# Patient Record
Sex: Female | Born: 1940
Health system: Southern US, Community
[De-identification: ages and names within clinical notes are randomized; demographics above are authoritative.]

## PROBLEM LIST (undated history)

## (undated) DIAGNOSIS — M1990 Unspecified osteoarthritis, unspecified site: Secondary | ICD-10-CM

## (undated) DIAGNOSIS — K08109 Complete loss of teeth, unspecified cause, unspecified class: Secondary | ICD-10-CM

## (undated) DIAGNOSIS — D649 Anemia, unspecified: Secondary | ICD-10-CM

## (undated) DIAGNOSIS — F419 Anxiety disorder, unspecified: Secondary | ICD-10-CM

## (undated) DIAGNOSIS — Z972 Presence of dental prosthetic device (complete) (partial): Secondary | ICD-10-CM

## (undated) DIAGNOSIS — Z923 Personal history of irradiation: Secondary | ICD-10-CM

## (undated) DIAGNOSIS — E559 Vitamin D deficiency, unspecified: Secondary | ICD-10-CM

## (undated) DIAGNOSIS — K219 Gastro-esophageal reflux disease without esophagitis: Secondary | ICD-10-CM

## (undated) DIAGNOSIS — C50912 Malignant neoplasm of unspecified site of left female breast: Secondary | ICD-10-CM

## (undated) DIAGNOSIS — Z973 Presence of spectacles and contact lenses: Secondary | ICD-10-CM

## (undated) DIAGNOSIS — Z86010 Personal history of colonic polyps: Secondary | ICD-10-CM

## (undated) DIAGNOSIS — I10 Essential (primary) hypertension: Secondary | ICD-10-CM

## (undated) DIAGNOSIS — E213 Hyperparathyroidism, unspecified: Secondary | ICD-10-CM

## (undated) HISTORY — DX: Anxiety disorder, unspecified: F41.9

## (undated) HISTORY — DX: Vitamin D deficiency, unspecified: E55.9

## (undated) HISTORY — PX: TOTAL KNEE ARTHROPLASTY: SHX125

## (undated) HISTORY — DX: Essential (primary) hypertension: I10

## (undated) HISTORY — DX: Personal history of colonic polyps: Z86.010

---

## 2002-01-20 ENCOUNTER — Encounter: Payer: Self-pay | Admitting: Family Medicine

## 2002-01-20 ENCOUNTER — Ambulatory Visit (HOSPITAL_COMMUNITY): Admission: RE | Admit: 2002-01-20 | Discharge: 2002-01-20 | Payer: Self-pay | Admitting: Family Medicine

## 2007-10-27 DIAGNOSIS — Z860100 Personal history of colon polyps, unspecified: Secondary | ICD-10-CM

## 2007-10-27 HISTORY — DX: Personal history of colonic polyps: Z86.010

## 2007-10-27 HISTORY — DX: Personal history of colon polyps, unspecified: Z86.0100

## 2009-01-31 ENCOUNTER — Ambulatory Visit (HOSPITAL_COMMUNITY): Payer: Self-pay | Admitting: Psychiatry

## 2009-02-14 ENCOUNTER — Ambulatory Visit (HOSPITAL_COMMUNITY): Payer: Self-pay | Admitting: Psychiatry

## 2009-02-26 ENCOUNTER — Ambulatory Visit (HOSPITAL_COMMUNITY): Payer: Self-pay | Admitting: Psychiatry

## 2009-04-01 ENCOUNTER — Ambulatory Visit (HOSPITAL_COMMUNITY): Payer: Self-pay | Admitting: Psychiatry

## 2009-04-22 ENCOUNTER — Ambulatory Visit (HOSPITAL_COMMUNITY): Payer: Self-pay | Admitting: Psychiatry

## 2009-05-07 ENCOUNTER — Inpatient Hospital Stay (HOSPITAL_COMMUNITY): Admission: RE | Admit: 2009-05-07 | Discharge: 2009-05-10 | Payer: Self-pay | Admitting: Specialist

## 2009-09-06 ENCOUNTER — Ambulatory Visit (HOSPITAL_COMMUNITY): Payer: Self-pay | Admitting: Psychiatry

## 2009-10-26 HISTORY — PX: BREAST LUMPECTOMY: SHX2

## 2010-02-07 ENCOUNTER — Inpatient Hospital Stay (HOSPITAL_COMMUNITY): Admission: RE | Admit: 2010-02-07 | Discharge: 2010-02-10 | Payer: Self-pay | Admitting: Specialist

## 2010-04-10 ENCOUNTER — Ambulatory Visit: Admission: RE | Admit: 2010-04-10 | Discharge: 2010-07-09 | Payer: Self-pay | Admitting: Radiation Oncology

## 2011-01-13 LAB — BASIC METABOLIC PANEL
BUN: 9 mg/dL (ref 6–23)
BUN: 9 mg/dL (ref 6–23)
CO2: 28 mEq/L (ref 19–32)
CO2: 28 mEq/L (ref 19–32)
Calcium: 9.3 mg/dL (ref 8.4–10.5)
Calcium: 9.4 mg/dL (ref 8.4–10.5)
Chloride: 103 mEq/L (ref 96–112)
Chloride: 104 mEq/L (ref 96–112)
Creatinine, Ser: 0.93 mg/dL (ref 0.4–1.2)
Creatinine, Ser: 0.94 mg/dL (ref 0.4–1.2)
GFR calc Af Amer: >60 mL/min (ref >60)
GFR calc Af Amer: >60 mL/min (ref >60)
GFR calc non Af Amer: 60 mL/min — ABNORMAL LOW (ref >60)
GFR calc non Af Amer: >60 mL/min (ref >60)
Glucose, Bld: 124 mg/dL — ABNORMAL HIGH (ref 70–99)
Glucose, Bld: 133 mg/dL — ABNORMAL HIGH (ref 70–99)
Potassium: 3.6 mEq/L (ref 3.5–5.1)
Potassium: 3.7 mEq/L (ref 3.5–5.1)
Sodium: 136 mEq/L (ref 135–145)
Sodium: 136 mEq/L (ref 135–145)

## 2011-01-13 LAB — CBC
HCT: 28.2 % — ABNORMAL LOW (ref 36.0–46.0)
HCT: 31.3 % — ABNORMAL LOW (ref 36.0–46.0)
HCT: 31.8 % — ABNORMAL LOW (ref 36.0–46.0)
Hemoglobin: 10.5 g/dL — ABNORMAL LOW (ref 12.0–15.0)
Hemoglobin: 10.7 g/dL — ABNORMAL LOW (ref 12.0–15.0)
Hemoglobin: 9.4 g/dL — ABNORMAL LOW (ref 12.0–15.0)
MCHC: 33.2 g/dL (ref 30.0–36.0)
MCHC: 33.4 g/dL (ref 30.0–36.0)
MCHC: 33.5 g/dL (ref 30.0–36.0)
MCV: 92.8 fL (ref 78.0–100.0)
MCV: 93.1 fL (ref 78.0–100.0)
MCV: 93.4 fL (ref 78.0–100.0)
Platelets: 214 10*3/uL (ref 150–400)
Platelets: 225 10*3/uL (ref 150–400)
Platelets: 261 10*3/uL (ref 150–400)
RBC: 3.02 MIL/uL — ABNORMAL LOW (ref 3.87–5.11)
RBC: 3.36 MIL/uL — ABNORMAL LOW (ref 3.87–5.11)
RBC: 3.43 MIL/uL — ABNORMAL LOW (ref 3.87–5.11)
RDW: 12.6 % (ref 11.5–15.5)
RDW: 12.8 % (ref 11.5–15.5)
RDW: 12.8 % (ref 11.5–15.5)
WBC: 10.6 10*3/uL — ABNORMAL HIGH (ref 4.0–10.5)
WBC: 11.2 10*3/uL — ABNORMAL HIGH (ref 4.0–10.5)
WBC: 8.9 10*3/uL (ref 4.0–10.5)

## 2011-01-14 LAB — COMPREHENSIVE METABOLIC PANEL
BUN: 23 mg/dL (ref 6–23)
CO2: 30 mEq/L (ref 19–32)
Calcium: 10.2 mg/dL (ref 8.4–10.5)
Chloride: 105 mEq/L (ref 96–112)
Creatinine, Ser: 1.04 mg/dL (ref 0.4–1.2)
GFR calc Af Amer: >60 mL/min (ref >60)
GFR calc non Af Amer: 53 mL/min — ABNORMAL LOW (ref >60)
Total Bilirubin: 0.5 mg/dL (ref 0.3–1.2)

## 2011-01-14 LAB — CBC
HCT: 35.5 % — ABNORMAL LOW (ref 36.0–46.0)
MCHC: 33.2 g/dL (ref 30.0–36.0)
MCV: 93.1 fL (ref 78.0–100.0)
RBC: 3.82 MIL/uL — ABNORMAL LOW (ref 3.87–5.11)
WBC: 6.2 10*3/uL (ref 4.0–10.5)

## 2011-01-14 LAB — URINE MICROSCOPIC-ADD ON

## 2011-01-14 LAB — CROSSMATCH
ABO/RH(D): O POS
Antibody Screen: NEGATIVE

## 2011-01-14 LAB — URINALYSIS, ROUTINE W REFLEX MICROSCOPIC
Bilirubin Urine: NEGATIVE
Hgb urine dipstick: NEGATIVE
Ketones, ur: NEGATIVE mg/dL
Specific Gravity, Urine: 1.024 (ref 1.005–1.030)
Urobilinogen, UA: 0.2 mg/dL (ref 0.0–1.0)

## 2011-01-14 LAB — DIFFERENTIAL
Basophils Absolute: 0 10*3/uL (ref 0.0–0.1)
Eosinophils Relative: 4 % (ref 0–5)
Lymphocytes Relative: 19 % (ref 12–46)
Lymphs Abs: 1.2 10*3/uL (ref 0.7–4.0)
Neutro Abs: 4.1 10*3/uL (ref 1.7–7.7)
Neutrophils Relative %: 66 % (ref 43–77)

## 2011-01-14 LAB — PROTIME-INR
INR: 0.98 (ref 0.00–1.49)
Prothrombin Time: 12.9 seconds (ref 11.6–15.2)

## 2011-01-14 LAB — APTT: aPTT: 22 seconds — ABNORMAL LOW (ref 24–37)

## 2011-02-01 LAB — CBC
HCT: 26.8 % — ABNORMAL LOW (ref 36.0–46.0)
HCT: 32.1 % — ABNORMAL LOW (ref 36.0–46.0)
HCT: 35.8 % — ABNORMAL LOW (ref 36.0–46.0)
Hemoglobin: 9.1 g/dL — ABNORMAL LOW (ref 12.0–15.0)
Hemoglobin: 11.8 g/dL — ABNORMAL LOW (ref 12.0–15.0)
MCHC: 33 g/dL (ref 30.0–36.0)
MCHC: 33.5 g/dL (ref 30.0–36.0)
MCV: 93.8 fL (ref 78.0–100.0)
MCV: 92.3 fL (ref 78.0–100.0)
Platelets: 177 10*3/uL (ref 150–400)
Platelets: 244 10*3/uL (ref 150–400)
RDW: 13.6 % (ref 11.5–15.5)
RDW: 13.8 % (ref 11.5–15.5)
RDW: 13.9 % (ref 11.5–15.5)
WBC: 11.2 10*3/uL — ABNORMAL HIGH (ref 4.0–10.5)
WBC: 11.9 10*3/uL — ABNORMAL HIGH (ref 4.0–10.5)

## 2011-02-01 LAB — CROSSMATCH: Antibody Screen: NEGATIVE

## 2011-02-01 LAB — BASIC METABOLIC PANEL
BUN: 10 mg/dL (ref 6–23)
BUN: 13 mg/dL (ref 6–23)
CO2: 29 mEq/L (ref 19–32)
Chloride: 107 mEq/L (ref 96–112)
Chloride: 107 mEq/L (ref 96–112)
Creatinine, Ser: 0.93 mg/dL (ref 0.4–1.2)
GFR calc non Af Amer: >60 mL/min (ref >60)
GFR calc non Af Amer: >60 mL/min (ref >60)
Glucose, Bld: 120 mg/dL — ABNORMAL HIGH (ref 70–99)
Glucose, Bld: 141 mg/dL — ABNORMAL HIGH (ref 70–99)
Potassium: 4.3 mEq/L (ref 3.5–5.1)
Potassium: 4.2 mEq/L (ref 3.5–5.1)
Sodium: 139 mEq/L (ref 135–145)

## 2011-02-01 LAB — COMPREHENSIVE METABOLIC PANEL
BUN: 17 mg/dL (ref 6–23)
Calcium: 10.2 mg/dL (ref 8.4–10.5)
Creatinine, Ser: 0.9 mg/dL (ref 0.4–1.2)
Glucose, Bld: 93 mg/dL (ref 70–99)
Total Protein: 7.2 g/dL (ref 6.0–8.3)

## 2011-02-01 LAB — URINALYSIS, ROUTINE W REFLEX MICROSCOPIC
Bilirubin Urine: NEGATIVE
Nitrite: NEGATIVE
Specific Gravity, Urine: 1.019 (ref 1.005–1.030)
Urobilinogen, UA: 0.2 mg/dL (ref 0.0–1.0)

## 2011-02-01 LAB — DIFFERENTIAL
Basophils Relative: 1 % (ref 0–1)
Lymphocytes Relative: 23 % (ref 12–46)
Lymphs Abs: 1.7 10*3/uL (ref 0.7–4.0)
Monocytes Relative: 9 % (ref 3–12)
Neutro Abs: 4.6 10*3/uL (ref 1.7–7.7)
Neutrophils Relative %: 63 % (ref 43–77)

## 2011-02-01 LAB — PROTIME-INR: INR: 1 (ref 0.00–1.49)

## 2011-02-01 LAB — APTT: aPTT: 23 seconds — ABNORMAL LOW (ref 24–37)

## 2011-03-10 NOTE — Op Note (Signed)
NAME:  [name] [name], [name] [name]             ACCOUNT NO.:  [id number]   MEDICAL RECORD NO.:  [id number]          PATIENT TYPE:  INP   LOCATION:  0006                         FACILITY:  [facility]   PHYSICIAN:  Erasmo Leventhal, M.D.DATE OF BIRTH:  Sep 24, 1941   DATE OF PROCEDURE:  05/07/2009  DATE OF DISCHARGE:                               OPERATIVE REPORT   PREOPERATIVE DIAGNOSIS:  Left knee end-stage osteoarthritis.   POSTOPERATIVE DIAGNOSIS:  Left knee end-stage osteoarthritis.   PROCEDURE:  Left total knee arthroplasty.   SURGEON:  Erasmo Leventhal, M.D.   ASSISTANT:  Oneida Alar, PA-C.   ANESTHESIA:  Regional with general.   BLOOD LOSS:  Less than 100 mL.   DRAINS:  One medium Hemovac.   COMPLICATIONS:  None.   TOURNIQUET TIME:  One hour and 55 minutes at 350 mmHg.   OPERATIVE IMPLANTS:  DePuy [employer].  Size 2.5 femur,  size 3.0 tibia, 15 mm posterior stabilized rotating platform tibial  insert and a 35 mm all polyethylene patella, all cemented.   OPERATIVE DETAILS:  The patient was counseled in the holding identified.  The correct side was identified, IV started, regional anesthetic was  administered, IV antibiotics were given on the way to the operating  room.  There, she was placed under general anesthesia.  A Foley catheter  placed in a sterile technique by the OR circulating nurse.  All  extremities were well padded and bumped.  Left knee 75 degree flexion  contracture.  She could flex to 100 degrees, limited by the large  posterior size of her thigh.  She was elevated, prepped with DuraPrep  and draped in a sterile fashion.  Exsanguinated with an Esmarch and  tourniquet inflated to 350 mmHg.  A straight midline incision was made  in the skin and subcutaneous tissue.  Medial and lateral soft tissue  flaps were developed and a medial parapatellar arthrotomy was performed.  The patella was retracted out of the way.  Severe end-stage arthritis  with  large bone spurs on both femur and tibia.  Osteophytes were removed  from the intercondylar notch.  The cruciate ligaments were resected.  A  starter hole made in the distal femur, canal was irrigated, the effluent  was clear.  Intramedullary rod was gently placed.  We chose a 5 valgus  cut and took a 10 mm cut off the distal femur.  The distal femur was  found be a size 2.5.  Rotation covers were set.  Somehow, there was  somewhat of an atypical knee with a hypoplastic lateral femoral condyle.  He had medial wear on the tibia.  Rotation was set based upon  epicondylar axis and Whitesides line.  Rotation covers were set for 2.5  femoral component, cuts were made.  Collateral ligaments were protected.  Posterior neurovascular structures were thawed off and protected  throughout the entire case.  The medial and lateral menisci were removed  under direct visualization.  Geniculate vessels were coagulated.  Large  osteophytes removed from the proximal tibia.  Extramedullary alignment  was utilized for the tibia with a 2  degree posterior slope because we  wanted to use the MBT revision tray due to her body habitus.  It was set  for a 10 mm cut off the least deficient side which was lateral side or 2  degree posterior slope.  This was done.  Posteromedial and posterior  femoral osteophytes were removed under direct visualization.  At this  time with flexion/extension blocks for a 10 insert were well-balanced.   The tibia was exposed.  Rotation cover was set for a size #3.  Reaming  was performed and then rotation cover set and then a keel was performed.  Femoral box cut was now performed.  At this point in time with a size  2.5 femur, 3.0 tibia, 10 mm insert, posterior stabilized rotating  platform, we had a well-balanced knee with a nice 5 degree valgus  alignment, full extension, stable to varus and valgus stress at 0, 30,  60, and 90 degrees of flexion.  The patella was found to be a size  #35.  Excess bone was removed and the patella button was placed for a 35 mm.  We now had anatomic patellofemoral tracking.  All trials were removed.  The knee was irrigated with pulsatile lavage.  Also, this was  supplemented by antibiotic solution.   Utilizing modern cement technique, all components were cemented into  place, a size 3 MBT revision tibia, size 2.5 Sigma femur, 35 patella.  We allowed the cement to cure with the 10 insert.  After the cemented  had cured, excess cement was removed.  We trialed with a 12.5 tibial  insert.  We had excellent full extension, flexion to 110 __________.  Rotation coverage was excellent.  The knee was exposed, trial was  removed, excess cement was removed, pulsatile lavaged again and the  final 12.5 posterior stabilized rotating platform tibial insert was  implanted.  We now checked the knee.  We had well-balanced  flexion/extension.  The patella tracked anatomically.   Medium Hemovac drains were placed.  A sequential closure in layers was  done.  The knee was closed in flexion with Vicryl sutures, subcu Vicryl,  skin with a subcuticular Monocryl suture.  Steri-Strips were applied.  A  sterile dressing applied.  The tourniquet was deflated.  Normal  circulation in the foot and ankle at the end of case.  The patient  tolerated the procedure well with no complication or problems.  She was  awakened and taken from the operating room to the PACU in stable  condition.   To help with surgical technique and decision making, Mr. Leana Gamer  assistance was needed throughout this entire difficult case due to her  large body habitus and her osseous deformity.           ______________________________  Erasmo Leventhal, M.D.     RAC/MEDQ  D:  05/07/2009  T:  05/07/2009  Job:  [id number]   cc:   [facility]

## 2011-03-10 NOTE — Discharge Summary (Signed)
NAME:  [name], [name]             ACCOUNT NO.:  [id number]   MEDICAL RECORD NO.:  [id number]          PATIENT TYPE:  INP   LOCATION:  1618                         FACILITY:  [facility]   PHYSICIAN:  Erasmo Leventhal, M.D.DATE OF BIRTH:  16-May-1941   DATE OF ADMISSION:  05/07/2009  DATE OF DISCHARGE:  05/10/2009                               DISCHARGE SUMMARY   ADMISSION DIAGNOSES:  1. End-stage osteoarthritis bilateral knees, left more symptomatic      than right.  2. Hypertension.  3. History of cataracts.  4. History of dentures.  5. Morbid obesity.   DISCHARGE DIAGNOSES:  1. Left total knee arthroplasty.  2. Acute postoperative blood loss anemia, asymptomatic.  We will allow      to self correct with p.o. supplements.  3. Postoperative hypokalemia, resolved with p.o. supplements,      asymptomatic.  4. Osteoarthritis right knee.  5. History of cataracts.  6. History of dentures.   HISTORY OF PRESENT ILLNESS:  The patient is a 70 year old female with  bilateral knee pain that has been bothering her for several years.  She  has failed conservative treatment.  The patient has popping, catching  and locking.  X-rays show she is bone-on-bone.  The patient elected to  proceed with a left total knee arthroplasty as the left knee was worse  than right.   ALLERGIES:  SULFA.   MEDICATIONS ON ADMISSION:  Celebrex, benazepril/hydrochlorothiazide,  aspirin, Centrum Silver, vitamin D.   SURGICAL PROCEDURES:  On May 07, 2009, the patient was taken to the OR  by Dr. Valma Cava, assisted by Oneida Alar, PA-C.  Under general and  regional anesthesia, the patient underwent a left total knee  arthroplasty with a DePuy rotating platform system.  There were no  complications.  The patient tolerated the procedure well.  There was  minimal blood loss.  The patient was transferred to the recovery room  and then to the orthopedic floor in good condition to follow total knee  protocol.  The  patient had the following components implanted:  A 2.5  left femoral component, a size 3 MBT tray, a size 35 three peg patella,  a size 2.5, 12.5-mm thickness polyethylene bearing.  All components were  implanted with polymethylmethacrylate.   CONSULTS:  The following routine consults were requested; physical  therapy, case management and pharmacy.   HOSPITAL COURSE:  On May 07, 2009, the patient was admitted to [facility] under the care of Dr. Valma Cava.  The patient was  taken to the OR where a left total knee arthroplasty was performed  without any complications.  The patient was transferred to the recovery  room and then to the orthopedic floor in good condition to follow a  total knee protocol on IV antibiotics, pain medicines and Lovenox for  DVT prophylaxis.  The patient did well throughout her hospitalization.  She did develop some low grade temps, mostly early in the mornings, but  these resolved and no etiology was specific.  The patient's vital signs  remained stable.  The patient did develop some acute postoperative blood  loss anemia.  Her hemoglobin was about 11, coming into the hospital and  she dropped down to 9.1.  On the date of discharge, the patient was  asymptomatic.  She tolerated it well without any lightheadedness or  dizziness, so she was allowed to self correct with p.o. supplements.  The patient also developed some postoperative hypokalemia.  Her  potassium dropped to 3.3.  She was given p.o. supplements and it did  improve to 4.3 on discharge.  The patient remained asymptomatic.  The  patient did very well with her CPM and ambulation with physical therapy.  She used minimal amounts of pain medicines.  The patient was arranged  for discharge home as she is medically and orthopedically stable and  ready for discharge.   LABORATORY DATA:  CBC on admission found WBC 7.3, hemoglobin 11.8,  hematocrit 35.8, platelets 251.  On discharge, WBCs were  11.3,  hemoglobin 9.1, hematocrit 26.8, platelets 174.  Routine chemistries on  admission were within normal limits.  Her potassium did drop to 3.3, but  with p.o. supplements she did improve to 4.3 on discharge.   DISCHARGE INSTRUCTIONS:   DIET:  No restrictions.   ACTIVITY:  The patient is to increase her activity as tolerated with the  use of a walker.   WOUND CARE:  She is keep the wound clean and dry.  She should change the  dressing on a daily basis.  She may shower if she covers the wound with  some saran wrap to seal it.   FOLLOW UP:  1. The patient needs a follow-up appointment in 2 weeks with Dr.      Thomasena Edis.  The patient is to call [phone] for that follow-up      appointment.  2. Home health care through [place].  3. CPM for home use through [employer]   MEDICATIONS:  1. Percocet 5 mg 1-2 every 4-6 hours for pain if needed.  2. Robaxin 500 mg 1 tablet every 6 hours for muscle spasms if needed.  3. Lovenox injection 1 injection every 12 hours until gone.  4. Colace 100 mg 1 tablet twice a day while on narcotics, may get over-      the-counter.  5. MiraLax 17 grams once a day for constipation if needed, may get      over-the-counter.  6. Iron 1 tablet twice a day for 30 days.   CPM INSTRUCTIONS:  She is to be 0-50 degrees for 6 hours a days and she  is to increase by 10 degrees a day as comfort allows.   The patient's condition upon discharge to home is listed as improved and  good.      Jamelle Rushing, P.A.    ______________________________  Erasmo Leventhal, M.D.    RWK/MEDQ  D:  05/10/2009  T:  05/10/2009  Job:  [id number]   cc:   Erasmo Leventhal, M.D.  Fax: [phone]   Clelia Croft, MD

## 2011-03-10 NOTE — H&P (Signed)
NAME:  [name] [name]             ACCOUNT NO.:  [id number]   MEDICAL RECORD NO.:  [id number]          PATIENT TYPE:  INP   LOCATION:                               FACILITY:  [facility]   PHYSICIAN:  Erasmo Leventhal, M.D.DATE OF BIRTH:  16-Jan-1941   DATE OF ADMISSION:  05/07/2009  DATE OF DISCHARGE:                              HISTORY & PHYSICAL   CHIEF COMPLAINT:  Bilateral knee pain.   PRESENT ILLNESS:  The patient is a 70 year old female with significant  bilateral knee pain, bothering her for several years.  She does note  that she has significant arthritis in the knees. She has had cortisone  injection in the knees for many years.  She still has popping, catching  and bone on bone grating-type sensation of pain.  The patient would like  to proceed with a total knee arthroplasty.  Her left knee is worse than  the right.  X-rays reveal she has end-stage osteoarthritis of the  bilateral knees, left slightly worse than the right.   ALLERGIES:  SULFA.   CURRENT MEDICATIONS:  1. Celebrex 200 mg a day.  2. Benazepril/hydrochlorothiazide 20/25 mg a day.  3. Aspirin 81 mg a day.  4. Centrum Silver once a day.  5. Vitamin D 400 international units a day.   PAST MEDICAL HISTORY:  1. Includes hypertension.  2. Cataracts.  3. Dentures.   REVIEW OF SYSTEMS:  NEUROLOGIC:  She denies any issues related to any  seizures, strokes or convulsions.  No problems with anxiety, depression,  drug abuse or alcohol use.  PULMONARY:  She denies any wheezing,  coughing or shortness of breath.  No history of sleep apnea.  No history  of tuberculosis.  CARDIOVASCULAR:  She denies any chest pains, irregular heart rhythms.  No skipping beats.  She has never had needed cardiac workup.  GI: She  denies any reflux, ulcers, hepatitis, diverticulitis issues in the past.  GU: She denies any urinary issues.  No frequent urinary tract  infections, no kidney stones.  ENDOCRINE:  She denies any thyroid or  diabetes issues.  HEMATOLOGIC.  She denies any anemia, blood clots or  sickle cell issues.   PAST SURGICAL HISTORY:  Unremarkable.   FAMILY HISTORY:  Father is deceased from a stroke.  Mother just has  depression.   SOCIAL HISTORY:  The patient is separated.  She is retired.  She has  smoked in the past many years ago.  She denies any alcohol or drugs.  She has four grown children.  She lives alone.  Her daughter will care  for her postop in a one-level home.  She does have a ramp up the front  entrance.   PHYSICAL EXAMINATION:  VITAL SIGNS:  Height is 5 feet 5 inches, weight  is 250 pounds, blood pressure is 142/82, pulse of 74 and regular,  respirations 12 and nonlabored.  The patient is afebrile.  GENERAL:  This is a healthy-appearing, well-developed female, conscious,  alert and appropriate.  She does walk with a side-to-side type limp.  HEENT: Head is normocephalic.  Pupils equal, round and reactive.  Gross  hearing is intact.  NECK:  Neck was supple.  No palpable lymphadenopathy.  Good range of  motion.  CHEST:  Lung sounds were clear and equal bilaterally.  No wheezes, rales  or rhonchi.  HEART:  Regular rate and rhythm.  ABDOMEN:  Bowel sounds present.  Soft and nontender.  EXTREMITIES:  Upper extremities had good range of motion in the  shoulders, elbows and wrists.  Good motor strength.  Lower extremities:  Both knees:  She could fully extend and flex back to 120.  She had no  signs of erythema.  No signs of effusions.  No signs of infection.  Calves were soft and nontender.  PERIPHERAL VASCULAR:  Carotid pulses were 2+, no bruits.  Radial pulses  were 2+, dorsalis pedis pulses were 2+.  She had no lower extremity  edema or venous stasis or pigmentation changes.  BREASTS/RECTAL/GENITOURINARY:  Exams were deferred at this time.   IMPRESSION:  1. End-stage osteoarthritis bilateral knees, left more symptomatic      than right.  2. Hypertension.  3. History of  cataracts.  4. History of dentures.   PLAN:  The patient will undergo all routine labs and tests prior to  having a left total knee arthroplasty by Dr. Erasmo Leventhal at  [facility] on May 07, 2009.  The patient has been medically  cleared by her primary care physician, Dr. Clelia Croft.      Jamelle Rushing, P.A.    ______________________________  Erasmo Leventhal, M.D.    RWK/MEDQ  D:  04/25/2009  T:  04/25/2009  Job:  [id number]

## 2012-08-10 DIAGNOSIS — C50919 Malignant neoplasm of unspecified site of unspecified female breast: Secondary | ICD-10-CM

## 2012-08-10 DIAGNOSIS — I10 Essential (primary) hypertension: Secondary | ICD-10-CM

## 2013-02-21 ENCOUNTER — Encounter: Payer: Medicare Other | Admitting: Internal Medicine

## 2013-02-21 DIAGNOSIS — C50919 Malignant neoplasm of unspecified site of unspecified female breast: Secondary | ICD-10-CM

## 2013-02-21 DIAGNOSIS — Z170 Estrogen receptor positive status [ER+]: Secondary | ICD-10-CM

## 2013-04-04 ENCOUNTER — Ambulatory Visit: Payer: Medicare Other | Admitting: Gastroenterology

## 2013-04-12 ENCOUNTER — Ambulatory Visit: Payer: Medicare Other | Admitting: Gastroenterology

## 2013-04-27 ENCOUNTER — Ambulatory Visit (INDEPENDENT_AMBULATORY_CARE_PROVIDER_SITE_OTHER): Payer: Medicare Other | Admitting: Gastroenterology

## 2013-04-27 ENCOUNTER — Encounter: Payer: Self-pay | Admitting: Gastroenterology

## 2013-04-27 ENCOUNTER — Other Ambulatory Visit: Payer: Self-pay | Admitting: Internal Medicine

## 2013-04-27 VITALS — BP 139/64 | HR 65 | Temp 97.4°F | Ht 65.0 in | Wt 262.2 lb

## 2013-04-27 DIAGNOSIS — D509 Iron deficiency anemia, unspecified: Secondary | ICD-10-CM

## 2013-04-27 DIAGNOSIS — Z860100 Personal history of colon polyps, unspecified: Secondary | ICD-10-CM | POA: Insufficient documentation

## 2013-04-27 MED ORDER — PEG 3350-KCL-NA BICARB-NACL 420 G PO SOLR: 4000.0000 mL | ORAL | Status: DC

## 2013-04-27 NOTE — Assessment & Plan Note (Signed)
72 year old female who presents today to schedule her 5 year surveillance colonoscopy for history of polyps. She reports her last colonoscopy was in 2009 by Dr. Loreta Ave and she was advised come back in 5 years. She denies any GI symptoms. She does note that about a month ago she was diagnosed with iron deficiency and placed on iron supplements. The last time she had iron deficiency anemia was postoperatively in 2010 when she had a knee replacement. She denies any NSAID use. She takes a daily aspirin. Consider chronic occult GI bleeding as etiology versus malabsorption.  1. Retrieve copy of recent labs from PCP. Further recommendations to follow. 2. I. FOBT 3. Colonoscopy in the near future. I have discussed the risks, alternatives, benefits with regards to but not limited to the risk of reaction to medication, bleeding, infection, perforation and the patient is agreeable to proceed. Written consent to be obtained. 4. Based on labs she may need further w/u of IDA.

## 2013-04-27 NOTE — Patient Instructions (Signed)
1. Please collect stool specimen and return to our office as soon as possible. 2. We have scheduled you for a colonoscopy with Dr. Jena Gauss. Please see separate instructions. 3. I have requested copy of your lab work from Dr. Sherryll Burger. If any further testing is necessary, we will let you know

## 2013-04-27 NOTE — Progress Notes (Signed)
Primary Care Physician:  SHAH,ASHISH, MD  Primary Gastroenterologist:    Chief Complaint  Patient presents with  . Colonoscopy    HPI:  [name] [name] is a 72 y.o. female here to schedule her 5 year surveillance colonoscopy for history of colon polyps. Her last colonoscopy was with Dr. Mann in 2009 per patient. She states she was advised to come back in 5 years. Patient notes she was recently started iron one month ago. Took iron postoperatively in 2010. Breast cancer in 2011. Hemoccult status unknown. She believes her iron/hemoglobin recovered after 2011 and just recently was noted to be abnormal.    No constipation, diarrhea, melena, brbpr, abdominal pain, heartburn, dysphagia, vomiting.   Current Outpatient Prescriptions  Medication Sig Dispense Refill  . ALPRAZolam (XANAX) 0.5 MG tablet Take 0.25 mg by mouth at bedtime as needed for sleep.      . amLODipine (NORVASC) 10 MG tablet Take 10 mg by mouth daily.      . anastrozole (ARIMIDEX) 1 MG tablet Take 1 mg by mouth daily.      . aspirin 81 MG tablet Take 81 mg by mouth daily.      . benazepril-hydrochlorthiazide (LOTENSIN HCT) 20-25 MG per tablet Take 1.5 tablets by mouth daily.      . calcium carbonate (OS-CAL) 600 MG TABS Take 600 mg by mouth 2 (two) times daily with a meal.      . Cholecalciferol (VITAMIN D-3) 1000 UNITS CAPS Take 1,000 Units by mouth 2 (two) times daily.      . ferrous sulfate 325 (65 FE) MG tablet Take 325 mg by mouth daily with breakfast.      . Multiple Vitamin (MULTIVITAMIN) capsule Take 1 capsule by mouth daily.       No current facility-administered medications for this visit.    Allergies as of 04/27/2013 - Review Complete 04/27/2013  Allergen Reaction Noted  . Sulfa antibiotics Swelling 04/27/2013    Past Medical History  Diagnosis Date  . Hypertension   . Vitamin D deficiency   . History of colon polyps 2009    Dr. Mann  . Breast cancer 2011    s/p lumpectomy, XRT, Arimadex  . Iron  deficiency     03/2013  . Anxiety     Past Surgical History  Procedure Laterality Date  . Colonoscopy  2009    Dr. Mann in [place]  . Total knee arthroplasty  2010    Left  . Total knee arthroplasty  2011    Right  . Breast lumpectomy  2011    left    Family History  Problem Relation Age of Onset  . Colon cancer Neg Hx   . Breast cancer Mother   . Lung cancer Neg Hx   . Ovarian cancer Neg Hx     History   Social History  . Marital Status: Legally Separated    Spouse Name: N/A    Number of Children: 4  . Years of Education: N/A   Occupational History  . Not on file.   Social History Main Topics  . Smoking status: Never Smoker   . Smokeless tobacco: Not on file  . Alcohol Use: No  . Drug Use: No  . Sexually Active: Not on file   Other Topics Concern  . Not on file   Social History Narrative  . No narrative on file      ROS:  General: Negative for anorexia, weight loss, fever, chills, fatigue, weakness. Eyes: Negative   for vision changes.  ENT: Negative for hoarseness, difficulty swallowing , nasal congestion. CV: Negative for chest pain, angina, palpitations, dyspnea on exertion, peripheral edema.  Respiratory: Negative for dyspnea at rest, dyspnea on exertion, cough, sputum, wheezing.  GI: See history of present illness. GU:  Negative for dysuria, hematuria, urinary incontinence, urinary frequency, nocturnal urination.  MS: Negative for joint pain, low back pain.  Derm: Negative for rash or itching.  Neuro: Negative for weakness, abnormal sensation, seizure, frequent headaches, memory loss, confusion.  Psych: Negative for anxiety, depression, suicidal ideation, hallucinations.  Endo: Negative for unusual weight change.  Heme: Negative for bruising or bleeding. Allergy: Negative for rash or hives.    Physical Examination:  BP 139/64  Pulse 65  Temp(Src) 97.4 F (36.3 C) (Oral)  Ht 5' 5" (1.651 m)  Wt 262 lb 3.2 oz (118.933 kg)  BMI 43.63 kg/m2    General: Well-nourished, well-developed in no acute distress.  Head: Normocephalic, atraumatic.   Eyes: Conjunctiva pink, no icterus. Mouth: Oropharyngeal mucosa moist and pink , no lesions erythema or exudate. Neck: Supple without thyromegaly, masses, or lymphadenopathy.  Lungs: Clear to auscultation bilaterally.  Heart: Regular rate and rhythm, no murmurs rubs or gallops.  Abdomen: Bowel sounds are normal, nontender, nondistended, no hepatosplenomegaly or masses, no abdominal bruits or    hernia , no rebound or guarding.   Rectal: not performed Extremities: No lower extremity edema. No clubbing or deformities.  Neuro: Alert and oriented x 4 , grossly normal neurologically.  Skin: Warm and dry, no rash or jaundice.   Psych: Alert and cooperative, normal mood and affect.   

## 2013-04-27 NOTE — Progress Notes (Signed)
CC PCP 

## 2013-05-05 ENCOUNTER — Ambulatory Visit (INDEPENDENT_AMBULATORY_CARE_PROVIDER_SITE_OTHER): Payer: Medicare Other | Admitting: Gastroenterology

## 2013-05-05 DIAGNOSIS — D649 Anemia, unspecified: Secondary | ICD-10-CM

## 2013-05-08 ENCOUNTER — Encounter (HOSPITAL_COMMUNITY): Payer: Self-pay | Admitting: Pharmacy Technician

## 2013-05-09 ENCOUNTER — Encounter: Payer: Self-pay | Admitting: Gastroenterology

## 2013-05-09 NOTE — Progress Notes (Signed)
Quick Note:  IFOBT negative. Labs from May 2014, PCP. No hemoglobin or iron. Creatinine 1.03, total bilirubin 0.3, alkaline phosphatase 93, AST 14, ALT 10, albumin 3.8.  Please request last CBC or hemoglobin in last iron or ferritin from PCP for review. Need ASAP. ______

## 2013-05-10 LAB — COMPREHENSIVE METABOLIC PANEL
AST: 14 U/L
Albumin: 3.8
Alkaline Phosphatase: 93 U/L
BUN: 24 mg/dL — AB (ref 4–21)
Creat: 1.03
Total Bilirubin: 0.3 mg/dL

## 2013-05-10 NOTE — Progress Notes (Signed)
Per [name] [name] at the pcp's office she is faxing over labs and they don't have a ferritin or iron on file.

## 2013-05-10 NOTE — Progress Notes (Signed)
Quick Note:  Per Claris Che from the pcps she is faxing labs, however they don't have a ferritin or iron on file. ______

## 2013-05-11 NOTE — Progress Notes (Signed)
Quick Note:  Received the CBC dated 03/17/2013. Hemoglobin 10.9. Complete report on Tana Coast' desk for review. ______

## 2013-05-15 NOTE — Progress Notes (Signed)
Reviewed labs from PCP dated May 2014. She had minimally low hemoglobin at 10.9 with normal reference range of 11.1-15.9. Her MCV was normal at 93. Her hematocrit was normal at 34.7. I. FOBT was negative. Plan for colonoscopy as scheduled.

## 2013-05-15 NOTE — Progress Notes (Signed)
Quick Note:  Reviewed labs from PCP dated 03/16/2013 White blood cell count 6800, hemoglobin 10.9 (normal 11.1-15.9), hematocrit 34.7, MCV 93, platelets 301,000.  Plan for colonoscopy as scheduled.  ______

## 2013-05-18 ENCOUNTER — Ambulatory Visit (HOSPITAL_COMMUNITY)
Admission: RE | Admit: 2013-05-18 | Discharge: 2013-05-18 | Disposition: A | Discharge status: Home / Self Care | Payer: Medicare Other | Source: Ambulatory Visit | Attending: Internal Medicine | Admitting: Internal Medicine

## 2013-05-18 ENCOUNTER — Encounter (HOSPITAL_COMMUNITY): Payer: Self-pay | Admitting: *Deleted

## 2013-05-18 ENCOUNTER — Encounter (HOSPITAL_COMMUNITY)
Admission: RE | Disposition: A | Discharge status: Home / Self Care | Payer: Self-pay | Source: Ambulatory Visit | Attending: Internal Medicine

## 2013-05-18 DIAGNOSIS — Z853 Personal history of malignant neoplasm of breast: Secondary | ICD-10-CM | POA: Insufficient documentation

## 2013-05-18 DIAGNOSIS — Z860100 Personal history of colon polyps, unspecified: Secondary | ICD-10-CM | POA: Insufficient documentation

## 2013-05-18 DIAGNOSIS — I10 Essential (primary) hypertension: Secondary | ICD-10-CM | POA: Insufficient documentation

## 2013-05-18 DIAGNOSIS — Z09 Encounter for follow-up examination after completed treatment for conditions other than malignant neoplasm: Secondary | ICD-10-CM | POA: Insufficient documentation

## 2013-05-18 DIAGNOSIS — K648 Other hemorrhoids: Secondary | ICD-10-CM

## 2013-05-18 DIAGNOSIS — D509 Iron deficiency anemia, unspecified: Secondary | ICD-10-CM

## 2013-05-18 DIAGNOSIS — K5730 Diverticulosis of large intestine without perforation or abscess without bleeding: Secondary | ICD-10-CM

## 2013-05-18 HISTORY — PX: COLONOSCOPY: SHX5424

## 2013-05-18 SURGERY — COLONOSCOPY
Anesthesia: Moderate Sedation

## 2013-05-18 MED ORDER — MIDAZOLAM HCL 5 MG/5ML IJ SOLN
INTRAMUSCULAR | Status: DC | PRN
  Administered 2013-05-18 (×3): 2 mg via INTRAVENOUS

## 2013-05-18 MED ORDER — MEPERIDINE HCL 100 MG/ML IJ SOLN
INTRAMUSCULAR | Status: AC
  Filled 2013-05-18: qty 1

## 2013-05-18 MED ORDER — MEPERIDINE HCL 100 MG/ML IJ SOLN
INTRAMUSCULAR | Status: DC | PRN
  Administered 2013-05-18 (×2): 50 mg via INTRAVENOUS

## 2013-05-18 MED ORDER — ONDANSETRON HCL 4 MG/2ML IJ SOLN
INTRAMUSCULAR | Status: AC
  Filled 2013-05-18: qty 2

## 2013-05-18 MED ORDER — SODIUM CHLORIDE 0.9 % IV SOLN
INTRAVENOUS | Status: DC
  Administered 2013-05-18: 1000 mL via INTRAVENOUS

## 2013-05-18 MED ORDER — MIDAZOLAM HCL 5 MG/5ML IJ SOLN
INTRAMUSCULAR | Status: AC
  Filled 2013-05-18: qty 10

## 2013-05-18 MED ORDER — STERILE WATER FOR IRRIGATION IR SOLN
Status: DC | PRN
  Administered 2013-05-18: 11:00:00

## 2013-05-18 MED ORDER — ONDANSETRON HCL 4 MG/2ML IJ SOLN
INTRAMUSCULAR | Status: DC | PRN
  Administered 2013-05-18: 4 mg via INTRAVENOUS

## 2013-05-18 NOTE — Interval H&P Note (Signed)
History and Physical Interval Note:  05/18/2013 10:55 AM  [name] [name]  has presented today for surgery, with the diagnosis of HISTORY OF COLON POLYPS AND IDA  The various methods of treatment have been discussed with the patient and family. After consideration of risks, benefits and other options for treatment, the patient has consented to  Procedure(s) with comments: COLONOSCOPY (N/A) - 9:30 as a surgical intervention .  The patient's history has been reviewed, patient examined, no change in status, stable for surgery.  I have reviewed the patient's chart and labs.  Questions were answered to the patient's satisfaction.       Patient Hemoccult negative. Colonoscopy today per plan given history of colonic polyps.The risks, benefits, limitations, alternatives and imponderables have been reviewed with the patient. Questions have been answered. All parties are agreeable.    [name]

## 2013-05-18 NOTE — Interval H&P Note (Signed)
History and Physical Interval Note:  05/18/2013 10:58 AM  [name] [name]  has presented today for surgery, with the diagnosis of HISTORY OF COLON POLYPS AND IDA  The various methods of treatment have been discussed with the patient and family. After consideration of risks, benefits and other options for treatment, the patient has consented to  Procedure(s) with comments: COLONOSCOPY (N/A) - 9:30 as a surgical intervention .  The patient's history has been reviewed, patient examined, no change in status, stable for surgery.  I have reviewed the patient's chart and labs.  Questions were answered to the patient's satisfaction.     [name] [name]  History of colonic polyps. Colonoscopy per plan.The risks, benefits, limitations, alternatives and imponderables have been reviewed with the patient. Questions have been answered. All parties are agreeable.

## 2013-05-18 NOTE — Op Note (Signed)
[facility] [address] [place], [address]   COLONOSCOPY PROCEDURE REPORT  PATIENT: [name] [name], [name] [name]  MR#:         [id number] BIRTHDATE: [birth year] , 72  yrs. old GENDER: Female ENDOSCOPIST: R.  Roetta Sessions, MD FACP FACG REFERRED BY:  Kirstie Peri, M.D. PROCEDURE DATE:  05/18/2013 PROCEDURE:     Surveillance colonoscopy  INDICATIONS: History of colonic adenoma  INFORMED CONSENT:  The risks, benefits, alternatives and imponderables including but not limited to bleeding, perforation as well as the possibility of a missed lesion have been reviewed.  The potential for biopsy, lesion removal, etc. have also been discussed.  Questions have been answered.  All parties agreeable. Please see the history and physical in the medical record for more information.  MEDICATIONS: Versed 6 mg IV and Demerol 100 mg IV in divided doses. Zofran 4 mg IV.  DESCRIPTION OF PROCEDURE:  After a digital rectal exam was performed, the EC-3890Li ([id number])  colonoscope was advanced from the anus through the rectum and colon to the area of the cecum, ileocecal valve and appendiceal orifice.  The cecum was deeply intubated.  These structures were well-seen and photographed for the record.  From the level of the cecum and ileocecal valve, the scope was slowly and cautiously withdrawn.  The mucosal surfaces were carefully surveyed utilizing scope tip deflection to facilitate fold flattening as needed.  The scope was pulled down into the rectum where a thorough examination including retroflexion was performed.    FINDINGS:  Adequate preparation. Single external hemorrhoidal tag. Internal hemorrhoids; otherwise normal rectum. Left-sided transverse diverticula; the remainder of the colonic mucosa appeared normal.  THERAPEUTIC / DIAGNOSTIC MANEUVERS PERFORMED:  None  COMPLICATIONS: None  CECAL WITHDRAWAL TIME:  10 minutes  IMPRESSION:  Colonic diverticulosis  RECOMMENDATIONS:    Recommend one more surveillance colonoscopy in 5 years if overall health permits.   _______________________________ eSigned:  R. Roetta Sessions, MD FACP [facility] 05/18/2013 11:33 AM   CC:

## 2013-05-18 NOTE — H&P (View-Only) (Signed)
Primary Care Physician:  Kirstie Peri, MD  Primary Gastroenterologist:    Chief Complaint  Patient presents with  . Colonoscopy    HPI:  [name] [name] is a 72 y.o. female here to schedule her 5 year surveillance colonoscopy for history of colon polyps. Her last colonoscopy was with Dr. Loreta Ave in 2009 per patient. She states she was advised to come back in 5 years. Patient notes she was recently started iron one month ago. Took iron postoperatively in 2010. Breast cancer in 2011. Hemoccult status unknown. She believes her iron/hemoglobin recovered after 2011 and just recently was noted to be abnormal.    No constipation, diarrhea, melena, brbpr, abdominal pain, heartburn, dysphagia, vomiting.   Current Outpatient Prescriptions  Medication Sig Dispense Refill  . ALPRAZolam (XANAX) 0.5 MG tablet Take 0.25 mg by mouth at bedtime as needed for sleep.      Marland Kitchen amLODipine (NORVASC) 10 MG tablet Take 10 mg by mouth daily.      Marland Kitchen anastrozole (ARIMIDEX) 1 MG tablet Take 1 mg by mouth daily.      Marland Kitchen aspirin 81 MG tablet Take 81 mg by mouth daily.      . benazepril-hydrochlorthiazide (LOTENSIN HCT) 20-25 MG per tablet Take 1.5 tablets by mouth daily.      . calcium carbonate (OS-CAL) 600 MG TABS Take 600 mg by mouth 2 (two) times daily with a meal.      . Cholecalciferol (VITAMIN D-3) 1000 UNITS CAPS Take 1,000 Units by mouth 2 (two) times daily.      . ferrous sulfate 325 (65 FE) MG tablet Take 325 mg by mouth daily with breakfast.      . Multiple Vitamin (MULTIVITAMIN) capsule Take 1 capsule by mouth daily.       No current facility-administered medications for this visit.    Allergies as of 04/27/2013 - Review Complete 04/27/2013  Allergen Reaction Noted  . Sulfa antibiotics Swelling 04/27/2013    Past Medical History  Diagnosis Date  . Hypertension   . Vitamin D deficiency   . History of colon polyps 2009    Dr. Loreta Ave  . Breast cancer 2011    s/p lumpectomy, XRT, Arimadex  . Iron  deficiency     03/2013  . Anxiety     Past Surgical History  Procedure Laterality Date  . Colonoscopy  2009    Dr. Loreta Ave in [place]  . Total knee arthroplasty  2010    Left  . Total knee arthroplasty  2011    Right  . Breast lumpectomy  2011    left    Family History  Problem Relation Age of Onset  . Colon cancer Neg Hx   . Breast cancer Mother   . Lung cancer Neg Hx   . Ovarian cancer Neg Hx     History   Social History  . Marital Status: Legally Separated    Spouse Name: N/A    Number of Children: 4  . Years of Education: N/A   Occupational History  . Not on file.   Social History Main Topics  . Smoking status: Never Smoker   . Smokeless tobacco: Not on file  . Alcohol Use: No  . Drug Use: No  . Sexually Active: Not on file   Other Topics Concern  . Not on file   Social History Narrative  . No narrative on file      ROS:  General: Negative for anorexia, weight loss, fever, chills, fatigue, weakness. Eyes: Negative  for vision changes.  ENT: Negative for hoarseness, difficulty swallowing , nasal congestion. CV: Negative for chest pain, angina, palpitations, dyspnea on exertion, peripheral edema.  Respiratory: Negative for dyspnea at rest, dyspnea on exertion, cough, sputum, wheezing.  GI: See history of present illness. GU:  Negative for dysuria, hematuria, urinary incontinence, urinary frequency, nocturnal urination.  MS: Negative for joint pain, low back pain.  Derm: Negative for rash or itching.  Neuro: Negative for weakness, abnormal sensation, seizure, frequent headaches, memory loss, confusion.  Psych: Negative for anxiety, depression, suicidal ideation, hallucinations.  Endo: Negative for unusual weight change.  Heme: Negative for bruising or bleeding. Allergy: Negative for rash or hives.    Physical Examination:  BP 139/64  Pulse 65  Temp(Src) 97.4 F (36.3 C) (Oral)  Ht 5\' 5"  (1.651 m)  Wt 262 lb 3.2 oz (118.933 kg)  BMI 43.63 kg/m2    General: Well-nourished, well-developed in no acute distress.  Head: Normocephalic, atraumatic.   Eyes: Conjunctiva pink, no icterus. Mouth: Oropharyngeal mucosa moist and pink , no lesions erythema or exudate. Neck: Supple without thyromegaly, masses, or lymphadenopathy.  Lungs: Clear to auscultation bilaterally.  Heart: Regular rate and rhythm, no murmurs rubs or gallops.  Abdomen: Bowel sounds are normal, nontender, nondistended, no hepatosplenomegaly or masses, no abdominal bruits or    hernia , no rebound or guarding.   Rectal: not performed Extremities: No lower extremity edema. No clubbing or deformities.  Neuro: Alert and oriented x 4 , grossly normal neurologically.  Skin: Warm and dry, no rash or jaundice.   Psych: Alert and cooperative, normal mood and affect.

## 2013-05-18 NOTE — H&P (View-Only) (Signed)
CC PCP 

## 2013-05-19 ENCOUNTER — Encounter (HOSPITAL_COMMUNITY): Payer: Self-pay | Admitting: Internal Medicine

## 2013-05-19 LAB — CBC
HCT: 35 %
HGB: 10.9 g/dL

## 2013-12-05 NOTE — Progress Notes (Signed)
REVIEWED.  

## 2015-09-11 ENCOUNTER — Other Ambulatory Visit: Payer: Self-pay | Admitting: Oncology

## 2015-09-11 DIAGNOSIS — C50912 Malignant neoplasm of unspecified site of left female breast: Secondary | ICD-10-CM

## 2015-09-17 ENCOUNTER — Other Ambulatory Visit: Payer: Medicare Other

## 2015-09-18 ENCOUNTER — Ambulatory Visit
Admission: RE | Admit: 2015-09-18 | Discharge: 2015-09-18 | Disposition: A | Discharge status: Home / Self Care | Payer: Medicare Other | Source: Ambulatory Visit | Attending: Oncology | Admitting: Oncology

## 2015-09-18 DIAGNOSIS — C50912 Malignant neoplasm of unspecified site of left female breast: Secondary | ICD-10-CM

## 2015-09-18 MED ORDER — GADOBENATE DIMEGLUMINE 529 MG/ML IV SOLN
20.0000 mL | Freq: Once | INTRAVENOUS | Status: AC | PRN
  Administered 2015-09-18: 20 mL via INTRAVENOUS

## 2015-10-04 ENCOUNTER — Other Ambulatory Visit: Payer: Self-pay | Admitting: Internal Medicine

## 2015-10-04 DIAGNOSIS — R9389 Abnormal findings on diagnostic imaging of other specified body structures: Secondary | ICD-10-CM

## 2015-10-07 ENCOUNTER — Other Ambulatory Visit: Payer: Self-pay | Admitting: Internal Medicine

## 2015-10-07 DIAGNOSIS — N6320 Unspecified lump in the left breast, unspecified quadrant: Secondary | ICD-10-CM

## 2015-10-09 ENCOUNTER — Ambulatory Visit
Admission: RE | Admit: 2015-10-09 | Discharge: 2015-10-09 | Disposition: A | Discharge status: Home / Self Care | Payer: Medicare Other | Source: Ambulatory Visit | Attending: Internal Medicine | Admitting: Internal Medicine

## 2015-10-09 ENCOUNTER — Other Ambulatory Visit: Payer: Self-pay | Admitting: Diagnostic Radiology

## 2015-10-09 DIAGNOSIS — R9389 Abnormal findings on diagnostic imaging of other specified body structures: Secondary | ICD-10-CM

## 2015-10-09 DIAGNOSIS — N6320 Unspecified lump in the left breast, unspecified quadrant: Secondary | ICD-10-CM

## 2015-10-09 MED ORDER — GADOBENATE DIMEGLUMINE 529 MG/ML IV SOLN
20.0000 mL | Freq: Once | INTRAVENOUS | Status: AC | PRN
  Administered 2015-10-09: 20 mL via INTRAVENOUS

## 2015-11-14 DIAGNOSIS — N641 Fat necrosis of breast: Secondary | ICD-10-CM | POA: Diagnosis not present

## 2015-11-14 DIAGNOSIS — Z923 Personal history of irradiation: Secondary | ICD-10-CM | POA: Diagnosis not present

## 2015-11-14 DIAGNOSIS — C50912 Malignant neoplasm of unspecified site of left female breast: Secondary | ICD-10-CM | POA: Diagnosis not present

## 2015-11-14 DIAGNOSIS — M8580 Other specified disorders of bone density and structure, unspecified site: Secondary | ICD-10-CM | POA: Diagnosis not present

## 2015-11-14 DIAGNOSIS — Z79811 Long term (current) use of aromatase inhibitors: Secondary | ICD-10-CM | POA: Diagnosis not present

## 2015-11-26 DIAGNOSIS — R319 Hematuria, unspecified: Secondary | ICD-10-CM | POA: Diagnosis not present

## 2015-11-26 DIAGNOSIS — Z6841 Body Mass Index (BMI) 40.0 and over, adult: Secondary | ICD-10-CM | POA: Diagnosis not present

## 2015-11-26 DIAGNOSIS — Z789 Other specified health status: Secondary | ICD-10-CM | POA: Diagnosis not present

## 2015-11-26 DIAGNOSIS — M549 Dorsalgia, unspecified: Secondary | ICD-10-CM | POA: Diagnosis not present

## 2015-12-03 DIAGNOSIS — I10 Essential (primary) hypertension: Secondary | ICD-10-CM | POA: Diagnosis not present

## 2015-12-03 DIAGNOSIS — J0100 Acute maxillary sinusitis, unspecified: Secondary | ICD-10-CM | POA: Diagnosis not present

## 2016-01-02 DIAGNOSIS — M159 Polyosteoarthritis, unspecified: Secondary | ICD-10-CM | POA: Diagnosis not present

## 2016-01-02 DIAGNOSIS — I10 Essential (primary) hypertension: Secondary | ICD-10-CM | POA: Diagnosis not present

## 2016-01-30 DIAGNOSIS — R0982 Postnasal drip: Secondary | ICD-10-CM | POA: Diagnosis not present

## 2016-01-30 DIAGNOSIS — J0100 Acute maxillary sinusitis, unspecified: Secondary | ICD-10-CM | POA: Diagnosis not present

## 2016-01-30 DIAGNOSIS — R05 Cough: Secondary | ICD-10-CM | POA: Diagnosis not present

## 2016-01-30 DIAGNOSIS — Z87891 Personal history of nicotine dependence: Secondary | ICD-10-CM | POA: Diagnosis not present

## 2016-03-18 DIAGNOSIS — Z01419 Encounter for gynecological examination (general) (routine) without abnormal findings: Secondary | ICD-10-CM | POA: Diagnosis not present

## 2016-03-18 DIAGNOSIS — Z124 Encounter for screening for malignant neoplasm of cervix: Secondary | ICD-10-CM | POA: Diagnosis not present

## 2016-03-18 DIAGNOSIS — N950 Postmenopausal bleeding: Secondary | ICD-10-CM | POA: Diagnosis not present

## 2016-04-01 DIAGNOSIS — I10 Essential (primary) hypertension: Secondary | ICD-10-CM | POA: Diagnosis not present

## 2016-04-01 DIAGNOSIS — M159 Polyosteoarthritis, unspecified: Secondary | ICD-10-CM | POA: Diagnosis not present

## 2016-04-06 DIAGNOSIS — I10 Essential (primary) hypertension: Secondary | ICD-10-CM | POA: Diagnosis not present

## 2016-04-06 DIAGNOSIS — Z1389 Encounter for screening for other disorder: Secondary | ICD-10-CM | POA: Diagnosis not present

## 2016-04-06 DIAGNOSIS — Z0000 Encounter for general adult medical examination without abnormal findings: Secondary | ICD-10-CM | POA: Diagnosis not present

## 2016-04-06 DIAGNOSIS — R5383 Other fatigue: Secondary | ICD-10-CM | POA: Diagnosis not present

## 2016-04-06 DIAGNOSIS — Z79899 Other long term (current) drug therapy: Secondary | ICD-10-CM | POA: Diagnosis not present

## 2016-04-06 DIAGNOSIS — Z299 Encounter for prophylactic measures, unspecified: Secondary | ICD-10-CM | POA: Diagnosis not present

## 2016-04-06 DIAGNOSIS — Z1211 Encounter for screening for malignant neoplasm of colon: Secondary | ICD-10-CM | POA: Diagnosis not present

## 2016-04-06 DIAGNOSIS — Z7189 Other specified counseling: Secondary | ICD-10-CM | POA: Diagnosis not present

## 2016-04-06 DIAGNOSIS — Z6841 Body Mass Index (BMI) 40.0 and over, adult: Secondary | ICD-10-CM | POA: Diagnosis not present

## 2016-04-17 DIAGNOSIS — N9489 Other specified conditions associated with female genital organs and menstrual cycle: Secondary | ICD-10-CM | POA: Diagnosis not present

## 2016-04-17 DIAGNOSIS — N950 Postmenopausal bleeding: Secondary | ICD-10-CM | POA: Diagnosis not present

## 2016-05-06 DIAGNOSIS — N950 Postmenopausal bleeding: Secondary | ICD-10-CM | POA: Diagnosis not present

## 2016-05-06 DIAGNOSIS — N9489 Other specified conditions associated with female genital organs and menstrual cycle: Secondary | ICD-10-CM | POA: Diagnosis not present

## 2016-05-06 DIAGNOSIS — R938 Abnormal findings on diagnostic imaging of other specified body structures: Secondary | ICD-10-CM | POA: Diagnosis not present

## 2016-05-08 ENCOUNTER — Other Ambulatory Visit: Payer: Self-pay | Admitting: Obstetrics and Gynecology

## 2016-05-11 DIAGNOSIS — M159 Polyosteoarthritis, unspecified: Secondary | ICD-10-CM | POA: Diagnosis not present

## 2016-05-11 DIAGNOSIS — I10 Essential (primary) hypertension: Secondary | ICD-10-CM | POA: Diagnosis not present

## 2016-05-12 DIAGNOSIS — E2839 Other primary ovarian failure: Secondary | ICD-10-CM | POA: Diagnosis not present

## 2016-05-22 NOTE — Patient Instructions (Addendum)
Your procedure is scheduled on:  Thursday, 8/3  Enter through the Main Entrance of [facility] at: 9:30 am  Pick up the phone at the desk and dial [phone].  Call this number if you have problems the morning of surgery: [phone].  Remember: Do NOT eat food or drink clear liquids (including water) after midnight Wednesday, 8/2  Take these medicines the morning of surgery with a SIP OF WATER:  Blood pressure medications, zantac and allegra if needed.  Do NOT wear jewelry (body piercing), metal hair clips/bobby pins, make-up, or nail polish.  Do NOT wear lotions, powders, or perfumes.  You may wear deoderant.  Do NOT shave for 48 hours prior to surgery.  Do NOT bring valuables to the hospital.  Contacts, dentures, or bridgework may not be worn into surgery.  Have a responsible adult drive you home and stay with you for 24 hours after your procedure.

## 2016-05-25 ENCOUNTER — Encounter (HOSPITAL_COMMUNITY)
Admission: RE | Admit: 2016-05-25 | Discharge: 2016-05-25 | Disposition: A | Discharge status: Home / Self Care | Payer: Medicare Other | Source: Ambulatory Visit | Attending: Obstetrics and Gynecology | Admitting: Obstetrics and Gynecology

## 2016-05-25 ENCOUNTER — Encounter (HOSPITAL_COMMUNITY): Payer: Self-pay

## 2016-05-25 ENCOUNTER — Other Ambulatory Visit: Payer: Self-pay

## 2016-05-25 DIAGNOSIS — N950 Postmenopausal bleeding: Secondary | ICD-10-CM | POA: Diagnosis not present

## 2016-05-25 DIAGNOSIS — I10 Essential (primary) hypertension: Secondary | ICD-10-CM | POA: Diagnosis not present

## 2016-05-25 DIAGNOSIS — N840 Polyp of corpus uteri: Secondary | ICD-10-CM | POA: Diagnosis not present

## 2016-05-25 DIAGNOSIS — Z853 Personal history of malignant neoplasm of breast: Secondary | ICD-10-CM | POA: Diagnosis not present

## 2016-05-25 DIAGNOSIS — Z86010 Personal history of colonic polyps: Secondary | ICD-10-CM | POA: Diagnosis not present

## 2016-05-25 DIAGNOSIS — K219 Gastro-esophageal reflux disease without esophagitis: Secondary | ICD-10-CM | POA: Diagnosis not present

## 2016-05-25 HISTORY — DX: Anemia, unspecified: D64.9

## 2016-05-25 HISTORY — DX: Gastro-esophageal reflux disease without esophagitis: K21.9

## 2016-05-25 HISTORY — DX: Unspecified osteoarthritis, unspecified site: M19.90

## 2016-05-25 LAB — BASIC METABOLIC PANEL
ANION GAP: 5 (ref 5–15)
BUN: 18 mg/dL (ref 6–20)
CALCIUM: 10.3 mg/dL (ref 8.9–10.3)
CO2: 29 mmol/L (ref 22–32)
Chloride: 103 mmol/L (ref 101–111)
Creatinine, Ser: 0.88 mg/dL (ref 0.44–1.00)
GFR calc Af Amer: >60 mL/min (ref >60)
GLUCOSE: 103 mg/dL — AB (ref 65–99)
Potassium: 4.3 mmol/L (ref 3.5–5.1)
SODIUM: 137 mmol/L (ref 135–145)

## 2016-05-25 LAB — CBC
HCT: 34.1 % — ABNORMAL LOW (ref 36.0–46.0)
Hemoglobin: 11.2 g/dL — ABNORMAL LOW (ref 12.0–15.0)
MCH: 30.2 pg (ref 26.0–34.0)
MCHC: 32.8 g/dL (ref 30.0–36.0)
MCV: 91.9 fL (ref 78.0–100.0)
PLATELETS: 314 10*3/uL (ref 150–400)
RBC: 3.71 MIL/uL — AB (ref 3.87–5.11)
RDW: 13.8 % (ref 11.5–15.5)
WBC: 8.3 10*3/uL (ref 4.0–10.5)

## 2016-05-28 ENCOUNTER — Encounter (HOSPITAL_COMMUNITY): Payer: Self-pay | Admitting: Certified Registered Nurse Anesthetist

## 2016-05-28 ENCOUNTER — Ambulatory Visit (HOSPITAL_COMMUNITY): Payer: Medicare Other | Admitting: Certified Registered Nurse Anesthetist

## 2016-05-28 ENCOUNTER — Ambulatory Visit (HOSPITAL_COMMUNITY)
Admission: RE | Admit: 2016-05-28 | Discharge: 2016-05-28 | Disposition: A | Discharge status: Home / Self Care | Payer: Medicare Other | Source: Ambulatory Visit | Attending: Obstetrics and Gynecology | Admitting: Obstetrics and Gynecology

## 2016-05-28 ENCOUNTER — Encounter (HOSPITAL_COMMUNITY)
Admission: RE | Disposition: A | Discharge status: Home / Self Care | Payer: Self-pay | Source: Ambulatory Visit | Attending: Obstetrics and Gynecology

## 2016-05-28 DIAGNOSIS — Z853 Personal history of malignant neoplasm of breast: Secondary | ICD-10-CM | POA: Diagnosis not present

## 2016-05-28 DIAGNOSIS — I10 Essential (primary) hypertension: Secondary | ICD-10-CM | POA: Insufficient documentation

## 2016-05-28 DIAGNOSIS — Z86010 Personal history of colonic polyps: Secondary | ICD-10-CM | POA: Insufficient documentation

## 2016-05-28 DIAGNOSIS — N950 Postmenopausal bleeding: Secondary | ICD-10-CM | POA: Diagnosis not present

## 2016-05-28 DIAGNOSIS — K219 Gastro-esophageal reflux disease without esophagitis: Secondary | ICD-10-CM | POA: Insufficient documentation

## 2016-05-28 DIAGNOSIS — N858 Other specified noninflammatory disorders of uterus: Secondary | ICD-10-CM | POA: Diagnosis not present

## 2016-05-28 DIAGNOSIS — N840 Polyp of corpus uteri: Secondary | ICD-10-CM | POA: Diagnosis not present

## 2016-05-28 HISTORY — PX: DILATATION & CURETTAGE/HYSTEROSCOPY WITH MYOSURE: SHX6511

## 2016-05-28 SURGERY — DILATATION & CURETTAGE/HYSTEROSCOPY WITH MYOSURE
Anesthesia: General | Site: Vagina

## 2016-05-28 MED ORDER — DEXAMETHASONE SODIUM PHOSPHATE 4 MG/ML IJ SOLN
INTRAMUSCULAR | Status: AC
  Filled 2016-05-28: qty 1

## 2016-05-28 MED ORDER — PROPOFOL 10 MG/ML IV BOLUS
INTRAVENOUS | Status: AC
  Filled 2016-05-28: qty 20

## 2016-05-28 MED ORDER — LIDOCAINE HCL (CARDIAC) 20 MG/ML IV SOLN
INTRAVENOUS | Status: DC | PRN
  Administered 2016-05-28: 40 mg via INTRAVENOUS

## 2016-05-28 MED ORDER — FENTANYL CITRATE (PF) 100 MCG/2ML IJ SOLN
INTRAMUSCULAR | Status: AC
  Filled 2016-05-28: qty 2

## 2016-05-28 MED ORDER — GLYCOPYRROLATE 0.2 MG/ML IJ SOLN
INTRAMUSCULAR | Status: AC
  Filled 2016-05-28: qty 1

## 2016-05-28 MED ORDER — LIDOCAINE HCL (CARDIAC) 20 MG/ML IV SOLN
INTRAVENOUS | Status: AC
  Filled 2016-05-28: qty 5

## 2016-05-28 MED ORDER — LACTATED RINGERS IV SOLN: INTRAVENOUS | Status: DC

## 2016-05-28 MED ORDER — SUCCINYLCHOLINE CHLORIDE 20 MG/ML IJ SOLN
INTRAMUSCULAR | Status: AC
  Filled 2016-05-28: qty 1

## 2016-05-28 MED ORDER — ONDANSETRON HCL 4 MG/2ML IJ SOLN
INTRAMUSCULAR | Status: AC
  Filled 2016-05-28: qty 2

## 2016-05-28 MED ORDER — SODIUM CHLORIDE 0.9 % IR SOLN
Status: DC | PRN
  Administered 2016-05-28: 3000 mL

## 2016-05-28 MED ORDER — LABETALOL HCL 5 MG/ML IV SOLN: 5.0000 mg | INTRAVENOUS | Status: DC | PRN

## 2016-05-28 MED ORDER — GLYCOPYRROLATE 0.2 MG/ML IJ SOLN
INTRAMUSCULAR | Status: DC | PRN
  Administered 2016-05-28: 0.1 mg via INTRAVENOUS

## 2016-05-28 MED ORDER — MEPERIDINE HCL 25 MG/ML IJ SOLN: 6.2500 mg | INTRAMUSCULAR | Status: DC | PRN

## 2016-05-28 MED ORDER — IBUPROFEN 800 MG PO TABS: 800.0000 mg | ORAL_TABLET | Freq: Three times a day (TID) | ORAL | 0 refills | Status: DC | PRN

## 2016-05-28 MED ORDER — LACTATED RINGERS IV SOLN
INTRAVENOUS | Status: DC
  Administered 2016-05-28 (×2): via INTRAVENOUS

## 2016-05-28 MED ORDER — DEXAMETHASONE SODIUM PHOSPHATE 10 MG/ML IJ SOLN
INTRAMUSCULAR | Status: DC | PRN
  Administered 2016-05-28: 4 mg via INTRAVENOUS

## 2016-05-28 MED ORDER — FENTANYL CITRATE (PF) 100 MCG/2ML IJ SOLN: 25.0000 ug | INTRAMUSCULAR | Status: DC | PRN

## 2016-05-28 MED ORDER — ONDANSETRON HCL 4 MG/2ML IJ SOLN
INTRAMUSCULAR | Status: DC | PRN
  Administered 2016-05-28: 4 mg via INTRAVENOUS

## 2016-05-28 MED ORDER — FENTANYL CITRATE (PF) 100 MCG/2ML IJ SOLN
INTRAMUSCULAR | Status: DC | PRN
  Administered 2016-05-28 (×2): 50 ug via INTRAVENOUS

## 2016-05-28 MED ORDER — SUCCINYLCHOLINE CHLORIDE 20 MG/ML IJ SOLN
INTRAMUSCULAR | Status: DC | PRN
  Administered 2016-05-28: 100 mg via INTRAVENOUS

## 2016-05-28 MED ORDER — PROPOFOL 10 MG/ML IV BOLUS
INTRAVENOUS | Status: DC | PRN
  Administered 2016-05-28: 30 mg via INTRAVENOUS
  Administered 2016-05-28: 120 mg via INTRAVENOUS
  Administered 2016-05-28: 50 mg via INTRAVENOUS
  Administered 2016-05-28: 30 mg via INTRAVENOUS
  Administered 2016-05-28: 50 mg via INTRAVENOUS

## 2016-05-28 MED ORDER — PROMETHAZINE HCL 25 MG/ML IJ SOLN: 6.2500 mg | INTRAMUSCULAR | Status: DC | PRN

## 2016-05-28 SURGICAL SUPPLY — 21 items
CANISTER SUCT 3000ML (MISCELLANEOUS) ×3 IMPLANT
CATH ROBINSON RED A/P 16FR (CATHETERS) ×3 IMPLANT
CLOTH BEACON ORANGE TIMEOUT ST (SAFETY) ×3 IMPLANT
CONTAINER PREFILL 10% NBF 60ML (FORM) ×3 IMPLANT
DEVICE MYOSURE LITE (MISCELLANEOUS) ×3 IMPLANT
DEVICE MYOSURE REACH (MISCELLANEOUS) IMPLANT
ELECT REM PT RETURN 9FT ADLT (ELECTROSURGICAL) ×3
ELECTRODE REM PT RTRN 9FT ADLT (ELECTROSURGICAL) ×1 IMPLANT
FILTER ARTHROSCOPY CONVERTOR (FILTER) ×3 IMPLANT
GLOVE BIOGEL PI IND STRL 7.0 (GLOVE) ×2 IMPLANT
GLOVE BIOGEL PI INDICATOR 7.0 (GLOVE) ×4
GLOVE ECLIPSE 6.5 STRL STRAW (GLOVE) ×3 IMPLANT
GOWN STRL REUS W/TWL LRG LVL3 (GOWN DISPOSABLE) ×6 IMPLANT
NS IRRIG 1000ML POUR BTL (IV SOLUTION) ×3 IMPLANT
PACK VAGINAL MINOR WOMEN LF (CUSTOM PROCEDURE TRAY) ×3 IMPLANT
PAD OB MATERNITY 4.3X12.25 (PERSONAL CARE ITEMS) ×3 IMPLANT
SEAL ROD LENS SCOPE MYOSURE (ABLATOR) ×3 IMPLANT
TOWEL OR 17X24 6PK STRL BLUE (TOWEL DISPOSABLE) ×6 IMPLANT
TUBING AQUILEX INFLOW (TUBING) ×3 IMPLANT
TUBING AQUILEX OUTFLOW (TUBING) ×3 IMPLANT
WATER STERILE IRR 1000ML POUR (IV SOLUTION) IMPLANT

## 2016-05-28 NOTE — Brief Op Note (Signed)
05/28/2016  12:02 PM  PATIENT:  [name] [name]  75 y.o. female  PRE-OPERATIVE DIAGNOSIS:  Postmenopausal Bleeding, Endometrial Polyp/Mass  POST-OPERATIVE DIAGNOSIS:  Postmenopausal Bleeding, Endometrial Polyp/Mass  PROCEDURE:  DIAGNOSTIC hysteroscopy,  Hysteroscopic resection of endometrial polyp, D&C using myosure  SURGEON:  Surgeon(s) and Role:    * Servando Salina, MD - Primary  PHYSICIAN ASSISTANT:   ASSISTANTS: none   ANESTHESIA:   general  EBL:  Total I/O In: 400 [I.V.:400] Out: 10 [Blood:10]  BLOOD ADMINISTERED:none  DRAINS: none   LOCAL MEDICATIONS USED:  NONE  SPECIMEN:  Source of Specimen:  EMC w/ polyp  DISPOSITION OF SPECIMEN:  PATHOLOGY  COUNTS:  YES  TOURNIQUET:  * No tourniquets in log *  DICTATION: .Other Dictation: Dictation Number [id number]  PLAN OF CARE: Discharge to home after PACU  PATIENT DISPOSITION:  PACU - hemodynamically stable.   Delay start of Pharmacological VTE agent (>24hrs) due to surgical blood loss or risk of bleeding: no

## 2016-05-28 NOTE — Op Note (Signed)
NAME:  [name] [name], [name] [name]             ACCOUNT NO.:  [id number]  MEDICAL RECORD NO.:  [id number]  LOCATION:  [facility]                          FACILITY:  [facility]  PHYSICIAN:  Servando Salina, M.D.DATE OF BIRTH:  September 27, 1941  DATE OF PROCEDURE:  05/28/2016 DATE OF DISCHARGE:  05/28/2016                              OPERATIVE REPORT   PREOPERATIVE DIAGNOSES:  Postmenopausal bleeding, endometrial polyp.  PROCEDURE:  Diagnostic hysteroscopy,dilation and curettage using MyoSure.  POSTOPERATIVE DIAGNOSES:  Postmenopausal bleeding, endometrial polyp.  ANESTHESIA:  General.  SURGEON:  Servando Salina, MD.  ASSISTANT:  None.  DESCRIPTION OF PROCEDURE:  Under adequate general anesthesia, the patient was placed in dorsal lithotomy position.  She was sterilely prepped and draped in usual fashion.  The patient had voided prior to entering the room and therefore, she was not catheterized.  Bivalve speculum was placed in the vagina.  A single-tooth tenaculum was placed on anterior lip of the cervix.  The cervix was then serially dilated up to #21 [facility] dilator.  The diagnostic hysteroscope was introduced and subsequently the MyoSure LITE apparatus was introduced.  The endometrium was with a small polyp.  The tubal ostia were sclerosed.  Endometrium was resected using the LITE apparatus.  Once that was done, the hysteroscope was removed and the cavity was gently curetted for a scant amount of tissue.  Instruments were then removed from the vagina.  SPECIMEN LABELED:  Endometrial curetting with polyp was sent to Pathology.  ESTIMATED BLOOD LOSS:  10 mL.  INTRAOPERATIVE FLUID:  400 mL.  COUNTS:  Sponge and instrument counts x2 was correct.  COMPLICATION:  None.  The patient tolerated the procedure well, was transferred to the recovery room in stable condition.     Servando Salina, M.D.     [place]/MEDQ  D:  05/28/2016  T:  05/28/2016  Job:  [id number]

## 2016-05-28 NOTE — H&P (Signed)
[name] [name] is an 75 y.o. female [phone] PMP not on HRT with PMB presents for surgical management due to findings of endometrial masses on sonohysterogram. Hx left breast cancer  Pertinent Gynecological History: Menses: post-menopausal Bleeding: post menopausal bleeding Contraception: none DES exposure: denies Blood transfusions: none Sexually transmitted diseases: no past history Previous GYN Procedures: none  Last mammogram: normal Date: 09/2015 Last pap: normal Date: 03/18/2016 OB History: G5P4  Menstrual History: Menarche age:n/a No LMP recorded. Patient is postmenopausal.    Past Medical History:  Diagnosis Date  . Anemia   . Anxiety   . Arthritis    back and arms  . Breast cancer ([place]) 2011   s/p lumpectomy, XRT, Arimadex  . GERD (gastroesophageal reflux disease)   . History of colon polyps 2009   Dr. Collene Mares  . Hypertension   . Iron deficiency    03/2013  . Vitamin D deficiency     Past Surgical History:  Procedure Laterality Date  . BREAST LUMPECTOMY  2011   left  . COLONOSCOPY  2009   Dr. Collene Mares in [place], tubular adenoma, cecum. scattered diverticula, hemorrhoids  . COLONOSCOPY N/A 05/18/2013   Procedure: COLONOSCOPY;  Surgeon: Daneil Dolin, MD;  Location: [facility];  Service: Endoscopy;  Laterality: N/A;  9:30  . TOTAL KNEE ARTHROPLASTY  2010   Left  . TOTAL KNEE ARTHROPLASTY  2011   Right    Family History  Problem Relation Age of Onset  . Breast cancer Mother   . Colon cancer Neg Hx   . Lung cancer Neg Hx   . Ovarian cancer Neg Hx     Social History:  reports that she has never smoked. She has never used smokeless tobacco. She reports that she does not drink alcohol or use drugs.  Allergies:  Allergies  Allergen Reactions  . Sulfa Antibiotics Swelling    No prescriptions prior to admission.    ROS neg except for PMB  There were no vitals taken for this visit. Physical Exam  Constitutional: She is oriented to person, place,  and time. She appears well-nourished.  HENT:  Head: Atraumatic.  Eyes: EOM are normal.  Neck: Neck supple.  Cardiovascular: Regular rhythm.   Respiratory: Breath sounds normal.  GI: Soft.  Genitourinary: Vagina normal and uterus normal.  Neurological: She is alert and oriented to person, place, and time.  Skin: Skin is warm and dry.  Psychiatric: She has a normal mood and affect.    No results found for this or any previous visit (from the past 24 hour(s)).  No results found.  Assessment/Plan: PMB Endometrial masses P) dx hysteroscopy, D&C, resection of masses using myosure. Risk of surgery reviewed including infection, bleeding, injury to surrounding organ structures, uterine perforation ( 10/998) and its risk, thermal injury, fluid overload. All ? Answered. Labs per anesthesia  Javen Ridings A 05/28/2016, 6:24 AM

## 2016-05-28 NOTE — Anesthesia Preprocedure Evaluation (Addendum)
Anesthesia Evaluation  Patient identified by MRN, date of birth, ID band Patient awake    Reviewed: Allergy & Precautions, NPO status , Patient's Chart, lab work & pertinent test results  Airway Mallampati: II  TM Distance: >3 FB Neck ROM: Full    Dental  (+) Upper Dentures, Lower Dentures   Pulmonary neg pulmonary ROS,    breath sounds clear to auscultation       Cardiovascular hypertension, Pt. on medications  Rhythm:Regular Rate:Normal     Neuro/Psych PSYCHIATRIC DISORDERS Anxiety negative neurological ROS     GI/Hepatic Neg liver ROS, GERD  ,  Endo/Other  negative endocrine ROS  Renal/GU negative Renal ROS  negative genitourinary   Musculoskeletal  (+) Arthritis ,   Abdominal (+) + obese,   Peds negative pediatric ROS (+)  Hematology   Anesthesia Other Findings   Reproductive/Obstetrics negative OB ROS                            Lab Results  Component Value Date   WBC 8.3 05/25/2016   HGB 11.2 (L) 05/25/2016   HCT 34.1 (L) 05/25/2016   MCV 91.9 05/25/2016   PLT 314 05/25/2016   Lab Results  Component Value Date   CREATININE 0.88 05/25/2016   BUN 18 05/25/2016   NA 137 05/25/2016   K 4.3 05/25/2016   CL 103 05/25/2016   CO2 29 05/25/2016   Lab Results  Component Value Date   INR 0.98 02/03/2010   INR 1.0 05/02/2009   04/2016 EKG: normal sinus rhythm.   Anesthesia Physical Anesthesia Plan  ASA: III  Anesthesia Plan: General   Post-op Pain Management:    Induction: Intravenous  Airway Management Planned: LMA  Additional Equipment:   Intra-op Plan:   Post-operative Plan: Extubation in OR  Informed Consent: I have reviewed the patients History and Physical, chart, labs and discussed the procedure including the risks, benefits and alternatives for the proposed anesthesia with the patient or authorized representative who has indicated his/her understanding and  acceptance.   Dental advisory given  Plan Discussed with: CRNA  Anesthesia Plan Comments:        Anesthesia Quick Evaluation

## 2016-05-28 NOTE — Anesthesia Procedure Notes (Signed)
Procedure Name: Intubation Date/Time: 05/28/2016 11:20 AM Performed by: Raenette Rover Pre-anesthesia Checklist: Patient identified, Emergency Drugs available, Suction available and Patient being monitored Patient Re-evaluated:Patient Re-evaluated prior to inductionOxygen Delivery Method: Circle system utilized Preoxygenation: Pre-oxygenation with 100% oxygen Intubation Type: IV induction Ventilation: Mask ventilation without difficulty Laryngoscope Size: Miller and 2 Grade View: Grade I Tube type: Oral Tube size: 7.0 mm Number of attempts: 1 Airway Equipment and Method: Stylet Placement Confirmation: ETT inserted through vocal cords under direct vision,  breath sounds checked- equal and bilateral,  positive ETCO2 and CO2 detector Secured at: 21 cm Tube secured with: Tape Dental Injury: Teeth and Oropharynx as per pre-operative assessment

## 2016-05-28 NOTE — Transfer of Care (Signed)
Immediate Anesthesia Transfer of Care Note  Patient: [name] [name]  Procedure(s) Performed: Procedure(s): DILATATION & CURETTAGE/HYSTEROSCOPY WITH MYOSURE (N/A)  Patient Location: PACU  Anesthesia Type:General  Level of Consciousness: awake, alert , oriented and patient cooperative  Airway & Oxygen Therapy: Patient Spontanous Breathing and Patient connected to nasal cannula oxygen  Post-op Assessment: Report given to RN and Post -op Vital signs reviewed and stable  Post vital signs: Reviewed and stable  Last Vitals:  Vitals:   05/28/16 0915 05/28/16 0957  BP: (!) 222/85 (!) 176/66  Pulse: (!) 55 (!) 56  Resp: 16   Temp: 36.3 C     Last Pain: There were no vitals filed for this visit.    Patients Stated Pain Goal: 3 ([id number])  Complications: No apparent anesthesia complications

## 2016-05-28 NOTE — Discharge Instructions (Signed)
CALL  IF TEMP>100.4, NOTHING PER VAGINA X 2 WK, CALL IF SOAKING A MAXI  PAD EVERY HOUR OR MORE FREQUENTLYCALL  IF TEMP>100.4, NOTHING PER VAGINA X 2 WK, CALL IF SOAKING A MAXI  PAD EVERY HOUR OR MORE FREQUENTLY  DISCHARGE INSTRUCTIONS: HYSTEROSCOPY  The following instructions have been prepared to help you care for yourself upon your return home.   Personal hygiene:  Use sanitary pads for vaginal drainage, not tampons.  Shower the day after your procedure.  NO tub baths, pools or Jacuzzis for 2-3 weeks.  Wipe front to back after using the bathroom.  Activity and limitations:  Do NOT drive or operate any equipment for 24 hours. The effects of anesthesia are still present and drowsiness may result.  Do NOT rest in bed all day.  Walking is encouraged.  Walk up and down stairs slowly.  You may resume your normal activity in one to two days or as indicated by your physician. Sexual activity: NO intercourse for at least 2 weeks after the procedure, or as indicated by your Doctor.  Diet: Eat a light meal as desired this evening. You may resume your usual diet tomorrow.  Return to Work: You may resume your work activities in one to two days or as indicated by Marine scientist.  What to expect after your surgery: Expect to have vaginal bleeding/discharge for 2-3 days and spotting for up to 10 days. It is not unusual to have soreness for up to 1-2 weeks. You may have a slight burning sensation when you urinate for the first day. Mild cramps may continue for a couple of days. You may have a regular period in 2-6 weeks.  Call your doctor for any of the following:  Excessive vaginal bleeding or clotting, saturating and changing one pad every hour.  Inability to urinate 6 hours after discharge from hospital.  Pain not relieved by pain medication.  Fever of 100.4 F or greater.  Unusual vaginal discharge or odor.  Return to office _________________Call for an appointment  ___________________ Patients signature: ______________________ Nurses signature ________________________  [facility] [phone]

## 2016-05-29 ENCOUNTER — Encounter (HOSPITAL_COMMUNITY): Payer: Self-pay | Admitting: Obstetrics and Gynecology

## 2016-06-01 NOTE — Anesthesia Postprocedure Evaluation (Signed)
Anesthesia Post Note  Patient: [name] [name]  Procedure(s) Performed: Procedure(s) (LRB): DILATATION & CURETTAGE/HYSTEROSCOPY WITH MYOSURE (N/A)  Patient location during evaluation: PACU Anesthesia Type: General Level of consciousness: awake and alert Pain management: pain level controlled Vital Signs Assessment: post-procedure vital signs reviewed and stable Respiratory status: spontaneous breathing, nonlabored ventilation, respiratory function stable and patient connected to nasal cannula oxygen Cardiovascular status: blood pressure returned to baseline and stable Postop Assessment: no signs of nausea or vomiting Anesthetic complications: no    Last Vitals:  Vitals:   05/28/16 1330 05/28/16 1355  BP:  (!) 154/61  Pulse: 62 62  Resp: 19 18  Temp: 36.3 C 36.6 C    Last Pain:  Vitals:   05/29/16 1333  TempSrc:   PainSc: 0-No pain                 Effie Berkshire

## 2016-06-09 DIAGNOSIS — J069 Acute upper respiratory infection, unspecified: Secondary | ICD-10-CM | POA: Diagnosis not present

## 2016-06-09 DIAGNOSIS — C50919 Malignant neoplasm of unspecified site of unspecified female breast: Secondary | ICD-10-CM | POA: Diagnosis not present

## 2016-06-09 DIAGNOSIS — N182 Chronic kidney disease, stage 2 (mild): Secondary | ICD-10-CM | POA: Diagnosis not present

## 2016-06-09 DIAGNOSIS — I10 Essential (primary) hypertension: Secondary | ICD-10-CM | POA: Diagnosis not present

## 2016-07-21 DIAGNOSIS — H40033 Anatomical narrow angle, bilateral: Secondary | ICD-10-CM | POA: Diagnosis not present

## 2016-07-21 DIAGNOSIS — H43823 Vitreomacular adhesion, bilateral: Secondary | ICD-10-CM | POA: Diagnosis not present

## 2016-07-21 DIAGNOSIS — H40013 Open angle with borderline findings, low risk, bilateral: Secondary | ICD-10-CM | POA: Diagnosis not present

## 2016-07-21 DIAGNOSIS — H40213 Acute angle-closure glaucoma, bilateral: Secondary | ICD-10-CM | POA: Diagnosis not present

## 2016-07-24 DIAGNOSIS — Z6841 Body Mass Index (BMI) 40.0 and over, adult: Secondary | ICD-10-CM | POA: Diagnosis not present

## 2016-07-24 DIAGNOSIS — Z87891 Personal history of nicotine dependence: Secondary | ICD-10-CM | POA: Diagnosis not present

## 2016-07-24 DIAGNOSIS — J0100 Acute maxillary sinusitis, unspecified: Secondary | ICD-10-CM | POA: Diagnosis not present

## 2016-07-24 DIAGNOSIS — I10 Essential (primary) hypertension: Secondary | ICD-10-CM | POA: Diagnosis not present

## 2016-07-24 DIAGNOSIS — K219 Gastro-esophageal reflux disease without esophagitis: Secondary | ICD-10-CM | POA: Diagnosis not present

## 2016-08-03 DIAGNOSIS — H40023 Open angle with borderline findings, high risk, bilateral: Secondary | ICD-10-CM | POA: Diagnosis not present

## 2016-08-04 DIAGNOSIS — Z23 Encounter for immunization: Secondary | ICD-10-CM | POA: Diagnosis not present

## 2016-08-11 DIAGNOSIS — I10 Essential (primary) hypertension: Secondary | ICD-10-CM | POA: Diagnosis not present

## 2016-08-11 DIAGNOSIS — M159 Polyosteoarthritis, unspecified: Secondary | ICD-10-CM | POA: Diagnosis not present

## 2016-08-28 ENCOUNTER — Other Ambulatory Visit: Payer: Self-pay | Admitting: Internal Medicine

## 2016-08-28 DIAGNOSIS — Z853 Personal history of malignant neoplasm of breast: Secondary | ICD-10-CM

## 2016-09-09 DIAGNOSIS — I10 Essential (primary) hypertension: Secondary | ICD-10-CM | POA: Diagnosis not present

## 2016-09-09 DIAGNOSIS — C50919 Malignant neoplasm of unspecified site of unspecified female breast: Secondary | ICD-10-CM | POA: Diagnosis not present

## 2016-09-09 DIAGNOSIS — Z299 Encounter for prophylactic measures, unspecified: Secondary | ICD-10-CM | POA: Diagnosis not present

## 2016-09-09 DIAGNOSIS — Z6841 Body Mass Index (BMI) 40.0 and over, adult: Secondary | ICD-10-CM | POA: Diagnosis not present

## 2016-09-09 DIAGNOSIS — I739 Peripheral vascular disease, unspecified: Secondary | ICD-10-CM | POA: Diagnosis not present

## 2016-09-09 DIAGNOSIS — F419 Anxiety disorder, unspecified: Secondary | ICD-10-CM | POA: Diagnosis not present

## 2016-09-11 ENCOUNTER — Ambulatory Visit
Admission: RE | Admit: 2016-09-11 | Discharge: 2016-09-11 | Disposition: A | Discharge status: Home / Self Care | Payer: Medicare Other | Source: Ambulatory Visit | Attending: Internal Medicine | Admitting: Internal Medicine

## 2016-09-11 DIAGNOSIS — I10 Essential (primary) hypertension: Secondary | ICD-10-CM | POA: Diagnosis not present

## 2016-09-11 DIAGNOSIS — Z853 Personal history of malignant neoplasm of breast: Secondary | ICD-10-CM

## 2016-09-11 DIAGNOSIS — R928 Other abnormal and inconclusive findings on diagnostic imaging of breast: Secondary | ICD-10-CM | POA: Diagnosis not present

## 2016-09-11 DIAGNOSIS — M159 Polyosteoarthritis, unspecified: Secondary | ICD-10-CM | POA: Diagnosis not present

## 2016-09-16 DIAGNOSIS — I10 Essential (primary) hypertension: Secondary | ICD-10-CM | POA: Diagnosis not present

## 2016-09-16 DIAGNOSIS — F419 Anxiety disorder, unspecified: Secondary | ICD-10-CM | POA: Diagnosis not present

## 2016-09-16 DIAGNOSIS — Z299 Encounter for prophylactic measures, unspecified: Secondary | ICD-10-CM | POA: Diagnosis not present

## 2016-09-29 DIAGNOSIS — J0100 Acute maxillary sinusitis, unspecified: Secondary | ICD-10-CM | POA: Diagnosis not present

## 2016-09-29 DIAGNOSIS — I10 Essential (primary) hypertension: Secondary | ICD-10-CM | POA: Diagnosis not present

## 2016-09-29 DIAGNOSIS — Z6841 Body Mass Index (BMI) 40.0 and over, adult: Secondary | ICD-10-CM | POA: Diagnosis not present

## 2016-09-29 DIAGNOSIS — Z789 Other specified health status: Secondary | ICD-10-CM | POA: Diagnosis not present

## 2016-09-29 DIAGNOSIS — Z299 Encounter for prophylactic measures, unspecified: Secondary | ICD-10-CM | POA: Diagnosis not present

## 2016-10-30 DIAGNOSIS — C50919 Malignant neoplasm of unspecified site of unspecified female breast: Secondary | ICD-10-CM | POA: Diagnosis not present

## 2016-10-30 DIAGNOSIS — Z299 Encounter for prophylactic measures, unspecified: Secondary | ICD-10-CM | POA: Diagnosis not present

## 2016-10-30 DIAGNOSIS — Z87891 Personal history of nicotine dependence: Secondary | ICD-10-CM | POA: Diagnosis not present

## 2016-10-30 DIAGNOSIS — Z6841 Body Mass Index (BMI) 40.0 and over, adult: Secondary | ICD-10-CM | POA: Diagnosis not present

## 2016-10-30 DIAGNOSIS — G4700 Insomnia, unspecified: Secondary | ICD-10-CM | POA: Diagnosis not present

## 2016-10-30 DIAGNOSIS — I10 Essential (primary) hypertension: Secondary | ICD-10-CM | POA: Diagnosis not present

## 2016-10-30 DIAGNOSIS — N182 Chronic kidney disease, stage 2 (mild): Secondary | ICD-10-CM | POA: Diagnosis not present

## 2016-11-18 DIAGNOSIS — H40012 Open angle with borderline findings, low risk, left eye: Secondary | ICD-10-CM | POA: Diagnosis not present

## 2016-11-18 DIAGNOSIS — H43823 Vitreomacular adhesion, bilateral: Secondary | ICD-10-CM | POA: Diagnosis not present

## 2016-11-18 DIAGNOSIS — H40031 Anatomical narrow angle, right eye: Secondary | ICD-10-CM | POA: Diagnosis not present

## 2016-11-25 DIAGNOSIS — Z713 Dietary counseling and surveillance: Secondary | ICD-10-CM | POA: Diagnosis not present

## 2016-11-25 DIAGNOSIS — I10 Essential (primary) hypertension: Secondary | ICD-10-CM | POA: Diagnosis not present

## 2016-11-25 DIAGNOSIS — J0100 Acute maxillary sinusitis, unspecified: Secondary | ICD-10-CM | POA: Diagnosis not present

## 2016-11-25 DIAGNOSIS — N182 Chronic kidney disease, stage 2 (mild): Secondary | ICD-10-CM | POA: Diagnosis not present

## 2016-11-25 DIAGNOSIS — Z6841 Body Mass Index (BMI) 40.0 and over, adult: Secondary | ICD-10-CM | POA: Diagnosis not present

## 2016-11-25 DIAGNOSIS — I739 Peripheral vascular disease, unspecified: Secondary | ICD-10-CM | POA: Diagnosis not present

## 2016-11-25 DIAGNOSIS — Z87891 Personal history of nicotine dependence: Secondary | ICD-10-CM | POA: Diagnosis not present

## 2016-11-25 DIAGNOSIS — Z299 Encounter for prophylactic measures, unspecified: Secondary | ICD-10-CM | POA: Diagnosis not present

## 2017-02-18 DIAGNOSIS — H40023 Open angle with borderline findings, high risk, bilateral: Secondary | ICD-10-CM | POA: Diagnosis not present

## 2017-02-19 DIAGNOSIS — Z124 Encounter for screening for malignant neoplasm of cervix: Secondary | ICD-10-CM | POA: Diagnosis not present

## 2017-02-23 DIAGNOSIS — M159 Polyosteoarthritis, unspecified: Secondary | ICD-10-CM | POA: Diagnosis not present

## 2017-02-23 DIAGNOSIS — I10 Essential (primary) hypertension: Secondary | ICD-10-CM | POA: Diagnosis not present

## 2017-04-05 DIAGNOSIS — Z789 Other specified health status: Secondary | ICD-10-CM | POA: Diagnosis not present

## 2017-04-05 DIAGNOSIS — I10 Essential (primary) hypertension: Secondary | ICD-10-CM | POA: Diagnosis not present

## 2017-04-05 DIAGNOSIS — J069 Acute upper respiratory infection, unspecified: Secondary | ICD-10-CM | POA: Diagnosis not present

## 2017-04-05 DIAGNOSIS — Z299 Encounter for prophylactic measures, unspecified: Secondary | ICD-10-CM | POA: Diagnosis not present

## 2017-04-05 DIAGNOSIS — K219 Gastro-esophageal reflux disease without esophagitis: Secondary | ICD-10-CM | POA: Diagnosis not present

## 2017-04-05 DIAGNOSIS — N182 Chronic kidney disease, stage 2 (mild): Secondary | ICD-10-CM | POA: Diagnosis not present

## 2017-04-05 DIAGNOSIS — Z6841 Body Mass Index (BMI) 40.0 and over, adult: Secondary | ICD-10-CM | POA: Diagnosis not present

## 2017-04-16 DIAGNOSIS — Z6841 Body Mass Index (BMI) 40.0 and over, adult: Secondary | ICD-10-CM | POA: Diagnosis not present

## 2017-04-16 DIAGNOSIS — Z79899 Other long term (current) drug therapy: Secondary | ICD-10-CM | POA: Diagnosis not present

## 2017-04-16 DIAGNOSIS — I10 Essential (primary) hypertension: Secondary | ICD-10-CM | POA: Diagnosis not present

## 2017-04-16 DIAGNOSIS — Z1389 Encounter for screening for other disorder: Secondary | ICD-10-CM | POA: Diagnosis not present

## 2017-04-16 DIAGNOSIS — Z7189 Other specified counseling: Secondary | ICD-10-CM | POA: Diagnosis not present

## 2017-04-16 DIAGNOSIS — Z299 Encounter for prophylactic measures, unspecified: Secondary | ICD-10-CM | POA: Diagnosis not present

## 2017-04-16 DIAGNOSIS — C50919 Malignant neoplasm of unspecified site of unspecified female breast: Secondary | ICD-10-CM | POA: Diagnosis not present

## 2017-04-16 DIAGNOSIS — F419 Anxiety disorder, unspecified: Secondary | ICD-10-CM | POA: Diagnosis not present

## 2017-04-16 DIAGNOSIS — Z0000 Encounter for general adult medical examination without abnormal findings: Secondary | ICD-10-CM | POA: Diagnosis not present

## 2017-04-16 DIAGNOSIS — E559 Vitamin D deficiency, unspecified: Secondary | ICD-10-CM | POA: Diagnosis not present

## 2017-04-16 DIAGNOSIS — Z1211 Encounter for screening for malignant neoplasm of colon: Secondary | ICD-10-CM | POA: Diagnosis not present

## 2017-04-16 DIAGNOSIS — I739 Peripheral vascular disease, unspecified: Secondary | ICD-10-CM | POA: Diagnosis not present

## 2017-04-16 DIAGNOSIS — N182 Chronic kidney disease, stage 2 (mild): Secondary | ICD-10-CM | POA: Diagnosis not present

## 2017-05-05 DIAGNOSIS — I10 Essential (primary) hypertension: Secondary | ICD-10-CM | POA: Diagnosis not present

## 2017-05-05 DIAGNOSIS — Z299 Encounter for prophylactic measures, unspecified: Secondary | ICD-10-CM | POA: Diagnosis not present

## 2017-05-05 DIAGNOSIS — M7061 Trochanteric bursitis, right hip: Secondary | ICD-10-CM | POA: Diagnosis not present

## 2017-05-05 DIAGNOSIS — Z713 Dietary counseling and surveillance: Secondary | ICD-10-CM | POA: Diagnosis not present

## 2017-05-05 DIAGNOSIS — I739 Peripheral vascular disease, unspecified: Secondary | ICD-10-CM | POA: Diagnosis not present

## 2017-05-05 DIAGNOSIS — N182 Chronic kidney disease, stage 2 (mild): Secondary | ICD-10-CM | POA: Diagnosis not present

## 2017-05-05 DIAGNOSIS — Z6841 Body Mass Index (BMI) 40.0 and over, adult: Secondary | ICD-10-CM | POA: Diagnosis not present

## 2017-05-05 DIAGNOSIS — C50919 Malignant neoplasm of unspecified site of unspecified female breast: Secondary | ICD-10-CM | POA: Diagnosis not present

## 2017-05-19 DIAGNOSIS — M7061 Trochanteric bursitis, right hip: Secondary | ICD-10-CM | POA: Diagnosis not present

## 2017-05-19 DIAGNOSIS — M1611 Unilateral primary osteoarthritis, right hip: Secondary | ICD-10-CM | POA: Diagnosis not present

## 2017-06-10 DIAGNOSIS — M1611 Unilateral primary osteoarthritis, right hip: Secondary | ICD-10-CM | POA: Diagnosis not present

## 2017-06-24 DIAGNOSIS — M1611 Unilateral primary osteoarthritis, right hip: Secondary | ICD-10-CM | POA: Diagnosis not present

## 2017-06-30 DIAGNOSIS — H40033 Anatomical narrow angle, bilateral: Secondary | ICD-10-CM | POA: Diagnosis not present

## 2017-06-30 DIAGNOSIS — H43823 Vitreomacular adhesion, bilateral: Secondary | ICD-10-CM | POA: Diagnosis not present

## 2017-06-30 DIAGNOSIS — H35373 Puckering of macula, bilateral: Secondary | ICD-10-CM | POA: Diagnosis not present

## 2017-07-15 DIAGNOSIS — Z08 Encounter for follow-up examination after completed treatment for malignant neoplasm: Secondary | ICD-10-CM | POA: Diagnosis not present

## 2017-07-15 DIAGNOSIS — Z1231 Encounter for screening mammogram for malignant neoplasm of breast: Secondary | ICD-10-CM | POA: Diagnosis not present

## 2017-07-15 DIAGNOSIS — M1990 Unspecified osteoarthritis, unspecified site: Secondary | ICD-10-CM | POA: Diagnosis not present

## 2017-07-15 DIAGNOSIS — Z882 Allergy status to sulfonamides status: Secondary | ICD-10-CM | POA: Diagnosis not present

## 2017-07-15 DIAGNOSIS — Z853 Personal history of malignant neoplasm of breast: Secondary | ICD-10-CM | POA: Diagnosis not present

## 2017-07-15 DIAGNOSIS — I10 Essential (primary) hypertension: Secondary | ICD-10-CM | POA: Diagnosis not present

## 2017-07-15 DIAGNOSIS — Z87891 Personal history of nicotine dependence: Secondary | ICD-10-CM | POA: Diagnosis not present

## 2017-07-15 DIAGNOSIS — Z809 Family history of malignant neoplasm, unspecified: Secondary | ICD-10-CM | POA: Diagnosis not present

## 2017-07-15 DIAGNOSIS — K219 Gastro-esophageal reflux disease without esophagitis: Secondary | ICD-10-CM | POA: Diagnosis not present

## 2017-07-15 DIAGNOSIS — Z923 Personal history of irradiation: Secondary | ICD-10-CM | POA: Diagnosis not present

## 2017-07-22 DIAGNOSIS — M159 Polyosteoarthritis, unspecified: Secondary | ICD-10-CM | POA: Diagnosis not present

## 2017-07-22 DIAGNOSIS — I10 Essential (primary) hypertension: Secondary | ICD-10-CM | POA: Diagnosis not present

## 2018-04-18 ENCOUNTER — Encounter: Payer: Self-pay | Admitting: Nutrition

## 2018-04-18 ENCOUNTER — Encounter: Payer: Medicare Other | Attending: Internal Medicine | Admitting: Nutrition

## 2018-04-18 VITALS — Ht 65.0 in | Wt 259.0 lb

## 2018-04-18 DIAGNOSIS — Z6841 Body Mass Index (BMI) 40.0 and over, adult: Secondary | ICD-10-CM | POA: Insufficient documentation

## 2018-04-18 DIAGNOSIS — Z713 Dietary counseling and surveillance: Secondary | ICD-10-CM | POA: Insufficient documentation

## 2018-04-18 DIAGNOSIS — E669 Obesity, unspecified: Secondary | ICD-10-CM

## 2018-04-18 NOTE — Patient Instructions (Addendum)
Goals 1. Follow Plate Method 2. Cut out juice and creamy dressing and processed meats 3. Increase fresh fruits and vegetables 4. Try chair exercise 30 minutes a day. 5. Lose 1 lb per week Keep a food journal

## 2018-04-18 NOTE — Progress Notes (Signed)
  Medical Nutrition Therapy:  Appt start time: 0800 end time:  0900.   Assessment:  Primary concerns today: Obesity.  PMH: S/p breast cancer. Her daughter lives with her. Planning on right  hip surgery and needs to lose 30 lbs. Dr. Nicholes Stairs- [facility].  Eats 3 meals per day. HIghest weight was 300 lbs. Lowest weight was 240 lbs.  Has recently been working on cutting back on high fat, processed foods. Limited mobility due to chronic hip pain. Walks with a cane. Still drives.  Physical activity;: water aerobic when she can.  Preferred Learning Style:   No preference indicated   Learning Readiness:  Ready  Change in progreess MEDICATIONS:   DIETARY INTAKE:  24-hr recall:  B ( AM): 1 [place] sausage link, greek yogurt, 4 oz OJ and blueberries.   Snk ( AM):   L ( PM): Toss salad, grilled chicken, squash,zucchini. water Snk ( PM): apple or popcorn D ( PM): Squash, baked chicken and 1/2 sweet potato, wate Snk ( PM):  Beverages: water  Usual physical activity: ADL and sometimes water aerobics.  Estimated energy needs: 1200 calories 135 g carbohydrates 90 g protein 33 g fat  Progress Towards Goal(s):  In progress.   Nutritional Diagnosis:  NI-1.5 Excessive energy intake As related to high fat high salt diet.  As evidenced by OBesity BMI 43.    Intervention:Nutrition and weight loss education provided on My Plate, CHO counting, meal planning, portion sizes, timing of meals, avoiding snacks between meals taking medications as prescribed, benefits of exercising 30 minutes per day and prevention of DM. Weight loss tips. Chair exercises.  Goals 1. Follow Plate Method 2. Cut out juice and creamy dressing and processed meats 3. Increase fresh fruits and vegetables 4. Try chair exercise 30 minutes a day. 5. Lose 1 lb per week Keep a food journal   Teaching Method Utilized:  Visual Auditory Hands on  Handouts given during visit include:  The Plate Method  Weight loss  tips  Meal Plan Card  Barriers to learning/adherence to lifestyle change: hip and joint pain Demonstrated degree of understanding via:  Teach Back   Monitoring/Evaluation:  Dietary intake, exercise, meal planning, and body weight in 1 month(s).

## 2018-04-29 ENCOUNTER — Encounter: Payer: Self-pay | Admitting: Internal Medicine

## 2018-06-06 ENCOUNTER — Encounter: Payer: Medicare Other | Attending: Internal Medicine | Admitting: Nutrition

## 2018-06-06 DIAGNOSIS — Z853 Personal history of malignant neoplasm of breast: Secondary | ICD-10-CM | POA: Insufficient documentation

## 2018-06-06 DIAGNOSIS — E669 Obesity, unspecified: Secondary | ICD-10-CM | POA: Insufficient documentation

## 2018-06-06 DIAGNOSIS — Z6841 Body Mass Index (BMI) 40.0 and over, adult: Secondary | ICD-10-CM | POA: Diagnosis not present

## 2018-06-06 DIAGNOSIS — Z713 Dietary counseling and surveillance: Secondary | ICD-10-CM | POA: Diagnosis present

## 2018-06-06 NOTE — Patient Instructions (Addendum)
Goals 1. Lose 2-3 lbs per month 2. Keep eating like you are. 3. Look uplutube chair exercises. Do 15-30 minutes of chair exercises as tolerated Goal wt 236 lbs

## 2018-06-06 NOTE — Progress Notes (Signed)
  Medical Nutrition Therapy:  Appt start time:  1200 end time: 1230  Assessment:  Primary concerns today: Obesity.  PMH: S/p breast cancer.  She lost 11 lbs. SHe notes she Calcium levels are 11.3 mg/dl and won't come down after being  off of all MVI and calcium supplements. Goes to see Dr. Manuella Ghazi on Wednesday. Daughter is having a mastectomy.. 11 lbs down from last visit. WT was 248 lbs today, last weight was 259 lbs.  Changes made:  Eating more vegetables, lean meal and fresh fruits. Cut out snacks. Eating more salmon and chicken and baked fish. Cut out snacks and processed foods. Uses Mrs. Dash and lemon pepper. Feels better. Physical activity: doing table bike cycle for upper body.  Water aerobics activated her hip pain and can't do that.   Preferred Learning Style:   No preference indicated   Learning Readiness:  Ready  Change in progreess MEDICATIONS:   DIETARY INTAKE:  24-hr recall:  B ( AM): 1 [place] sausage link, greek yogurt, 4 oz OJ and blueberries.   Snk ( AM):   L ( PM): Toss salad, grilled chicken, squash,zucchini. water Snk ( PM): apple or popcorn D ( PM): Squash, baked chicken and 1/2 sweet potato, wate Snk ( PM):  Beverages: water  Usual physical activity: ADL and sometimes water aerobics.  Estimated energy needs: 1200 calories 135 g carbohydrates 90 g protein 33 g fat  Progress Towards Goal(s):  In progress.   Nutritional Diagnosis:  NI-1.5 Excessive energy intake As related to high fat high salt diet.  As evidenced by OBesity BMI 43.    Intervention:Nutrition and weight loss education provided on My Plate, CHO counting, meal planning, portion sizes, timing of meals, avoiding snacks between meals taking medications as prescribed, benefits of exercising 30 minutes per day and prevention of DM. Weight loss tips. Chair exercises.  Goals 1. Lose 2-3 lbs per month 2. Keep eating like you are. 3. Look uplutube chair exercises. Do 15-30 minutes of chair  exercises as tolerated Goal wt 236 lbs  Teaching Method Utilized:  Visual Auditory Hands on  Handouts given during visit include:  The Plate Method  Weight loss tips  Meal Plan Card  Barriers to learning/adherence to lifestyle change: hip and joint pain Demonstrated degree of understanding via:  Teach Back   Monitoring/Evaluation:  Dietary intake, exercise, meal planning, and body weight in 3 month(s).

## 2018-06-07 ENCOUNTER — Ambulatory Visit (INDEPENDENT_AMBULATORY_CARE_PROVIDER_SITE_OTHER): Payer: Self-pay

## 2018-06-07 DIAGNOSIS — Z86010 Personal history of colonic polyps: Secondary | ICD-10-CM

## 2018-06-07 NOTE — Progress Notes (Signed)
Gastroenterology Pre-Procedure Review  Request Date:06/07/18 Requesting Physician: Dr.Shah and 5 year recall ( last tcs 05/18/13 with RMR- no polyps but had tubular adenoma in 2009)  PATIENT REVIEW QUESTIONS: The patient responded to the following health history questions as indicated:    Pt is aware of the new screening guidelines for stopping tcs. She said she may want one more tcs but she is going to be having hip replacement surgery soon and is not sure if she wants tcs done prior to the surery or if she wants to wait until her hip heals from surgery. She has an appointment with her ortho MD soon and will call back and let me know what she wants to do. I have triaged her so when she calls back, I can schedule her if she wishes to pursue one at this time.   1. Diabetes Melitis: no 2. Joint replacements in the past 12 months: no 3. Major health problems in the past 3 months: no 4. Has an artificial valve or MVP: no 5. Has a defibrillator: no 6. Has been advised in past to take antibiotics in advance of a procedure like teeth cleaning: no 7. Family history of colon cancer: no  8. Alcohol Use: no 9. History of sleep apnea: no  10. History of coronary artery or other vascular stents placed within the last 12 months: no 11. History of any prior anesthesia complications: no    MEDICATIONS & ALLERGIES:    Patient reports the following regarding taking any blood thinners:   Plavix? no Aspirin? no Coumadin? no Brilinta? no Xarelto? no Eliquis? no Pradaxa? no Savaysa? no Effient? no  Patient confirms/reports the following medications:  Current Outpatient Medications  Medication Sig Dispense Refill  . benazepril (LOTENSIN) 20 MG tablet Take 40 mg by mouth daily.    . cetirizine (ZYRTEC) 10 MG tablet Take 10 mg by mouth daily.    . ranitidine (ZANTAC) 75 MG tablet Take 75 mg by mouth daily as needed for heartburn.     No current facility-administered medications for this visit.      Patient confirms/reports the following allergies:  Allergies  Allergen Reactions  . Sulfa Antibiotics Swelling    No orders of the defined types were placed in this encounter.   AUTHORIZATION INFORMATION Primary Insurance: [employer] medicare,  [place] #: [id number] Pre-Cert / Josem Kaufmann required: no  SCHEDULE INFORMATION: Procedure has been scheduled as follows:  Date: , Time:  Location:   This Gastroenterology Pre-Precedure Review Form is being routed to the following provider(s): Neil Crouch, PA

## 2018-06-08 ENCOUNTER — Encounter: Payer: Self-pay | Admitting: Nutrition

## 2018-06-08 NOTE — Progress Notes (Signed)
OK to schedule if patient desires one last TCS.

## 2018-09-06 ENCOUNTER — Ambulatory Visit: Payer: Medicare Other | Admitting: Nutrition

## 2018-09-08 NOTE — Patient Instructions (Addendum)
[name] [name]  1941-05-22    Your procedure is scheduled on:  09-15-2018   Report to [facility] Main  Entrance, Report to admitting at  5:30 AM    Call this number if you have problems the morning of surgery [phone]         Remember: Do not eat food or drink liquids :After Midnight.                                      BRUSH YOUR TEETH MORNING OF SURGERY AND RINSE YOUR MOUTH OUT, NO CHEWING GUM, CANDY,OR MINTS.         Take these medicines the morning of surgery with A SIP OF WATER:  Cetirizine (zyrtec),  Ranitidine (zantac)                                     You may not have any metal on your body including hair pins and piercings              Do not wear jewelry, make-up, lotions, powders or perfumes, deodorant              Do not wear nail polish.  Do not shave  48 hours prior to surgery.                Do not bring valuables to the hospital. [place].  Contacts, dentures or bridgework may not be worn into surgery.  Leave suitcase in the car. After surgery it may be brought to your room.        _____________________________________________________________________             [facility] - Preparing for Surgery Before surgery, you can play an important role.  Because skin is not sterile, your skin needs to be as free of germs as possible.  You can reduce the number of germs on your skin by washing with CHG (chlorahexidine gluconate) soap before surgery.  CHG is an antiseptic cleaner which kills germs and bonds with the skin to continue killing germs even after washing. Please DO NOT use if you have an allergy to CHG or antibacterial soaps.  If your skin becomes reddened/irritated stop using the CHG and inform your nurse when you arrive at Short Stay. Do not shave (including legs and underarms) for at least 48 hours prior to the first CHG shower.  You may shave your  face/neck. Please follow these instructions carefully:  1.  Shower with CHG Soap the night before surgery and the  morning of Surgery.  2.  If you choose to wash your hair, wash your hair first as usual with your  normal  shampoo.  3.  After you shampoo, rinse your hair and body thoroughly to remove the  shampoo.                            4.  Use CHG as you would any other liquid soap.  You can apply chg directly  to the skin and wash  Gently with a scrungie or clean washcloth.  5.  Apply the CHG Soap to your body ONLY FROM THE NECK DOWN.   Do not use on face/ open                           Wound or open sores. Avoid contact with eyes, ears mouth and genitals (private parts).                       Wash face,  Genitals (private parts) with your normal soap.             6.  Wash thoroughly, paying special attention to the area where your surgery  will be performed.  7.  Thoroughly rinse your body with warm water from the neck down.  8.  DO NOT shower/wash with your normal soap after using and rinsing off  the CHG Soap.             9.  Pat yourself dry with a clean towel.            10.  Wear clean pajamas.            11.  Place clean sheets on your bed the night of your first shower and do not  sleep with pets. Day of Surgery : Do not apply any lotions/deodorants the morning of surgery.  Please wear clean clothes to the hospital/surgery center.  FAILURE TO FOLLOW THESE INSTRUCTIONS MAY RESULT IN THE CANCELLATION OF YOUR SURGERY PATIENT SIGNATURE_________________________________  NURSE SIGNATURE__________________________________  ________________________________________________________________________      Adam Phenix  An incentive spirometer is a tool that can help keep your lungs clear and active. This tool measures how well you are filling your lungs with each breath. Taking long deep breaths may help reverse or decrease the chance of developing  breathing (pulmonary) problems (especially infection) following:  A long period of time when you are unable to move or be active. BEFORE THE PROCEDURE   If the spirometer includes an indicator to show your best effort, your nurse or respiratory therapist will set it to a desired goal.  If possible, sit up straight or lean slightly forward. Try not to slouch.  Hold the incentive spirometer in an upright position. INSTRUCTIONS FOR USE  1. Sit on the edge of your bed if possible, or sit up as far as you can in bed or on a chair. 2. Hold the incentive spirometer in an upright position. 3. Breathe out normally. 4. Place the mouthpiece in your mouth and seal your lips tightly around it. 5. Breathe in slowly and as deeply as possible, raising the piston or the ball toward the top of the column. 6. Hold your breath for 3-5 seconds or for as long as possible. Allow the piston or ball to fall to the bottom of the column. 7. Remove the mouthpiece from your mouth and breathe out normally. 8. Rest for a few seconds and repeat Steps 1 through 7 at least 10 times every 1-2 hours when you are awake. Take your time and take a few normal breaths between deep breaths. 9. The spirometer may include an indicator to show your best effort. Use the indicator as a goal to work toward during each repetition. 10. After each set of 10 deep breaths, practice coughing to be sure your lungs are clear. If you have an incision (the cut made at the time of surgery),  support your incision when coughing by placing a pillow or rolled up towels firmly against it. Once you are able to get out of bed, walk around indoors and cough well. You may stop using the incentive spirometer when instructed by your caregiver.  RISKS AND COMPLICATIONS  Take your time so you do not get dizzy or light-headed.  If you are in pain, you may need to take or ask for pain medication before doing incentive spirometry. It is harder to take a deep  breath if you are having pain. AFTER USE  Rest and breathe slowly and easily.  It can be helpful to keep track of a log of your progress. Your caregiver can provide you with a simple table to help with this. If you are using the spirometer at home, follow these instructions: [place] IF:   You are having difficultly using the spirometer.  You have trouble using the spirometer as often as instructed.  Your pain medication is not giving enough relief while using the spirometer.  You develop fever of 100.5 F (38.1 C) or higher. SEEK IMMEDIATE MEDICAL CARE IF:   You cough up bloody sputum that had not been present before.  You develop fever of 102 F (38.9 C) or greater.  You develop worsening pain at or near the incision site. MAKE SURE YOU:   Understand these instructions.  Will watch your condition.  Will get help right away if you are not doing well or get worse. Document Released: 02/22/2007 Document Revised: 01/04/2012 Document Reviewed: 04/25/2007 ExitCare Patient Information 2014 ExitCare, [place].   ________________________________________________________________________    WHAT IS A BLOOD TRANSFUSION? Blood Transfusion Information  A transfusion is the replacement of blood or some of its parts. Blood is made up of multiple cells which provide different functions.  Red blood cells carry oxygen and are used for blood loss replacement.  White blood cells fight against infection.  Platelets control bleeding.  Plasma helps clot blood.  Other blood products are available for specialized needs, such as hemophilia or other clotting disorders. BEFORE THE TRANSFUSION  Who gives blood for transfusions?   Healthy volunteers who are fully evaluated to make sure their blood is safe. This is blood bank blood. Transfusion therapy is the safest it has ever been in the practice of medicine. Before blood is taken from a donor, a complete history is taken to make  sure that person has no history of diseases nor engages in risky social behavior (examples are intravenous drug use or sexual activity with multiple partners). The donor's travel history is screened to minimize risk of transmitting infections, such as malaria. The donated blood is tested for signs of infectious diseases, such as HIV and hepatitis. The blood is then tested to be sure it is compatible with you in order to minimize the chance of a transfusion reaction. If you or a relative donates blood, this is often done in anticipation of surgery and is not appropriate for emergency situations. It takes many days to process the donated blood. RISKS AND COMPLICATIONS Although transfusion therapy is very safe and saves many lives, the main dangers of transfusion include:   Getting an infectious disease.  Developing a transfusion reaction. This is an allergic reaction to something in the blood you were given. Every precaution is taken to prevent this. The decision to have a blood transfusion has been considered carefully by your caregiver before blood is given. Blood is not given unless the benefits outweigh the risks. AFTER  THE TRANSFUSION  Right after receiving a blood transfusion, you will usually feel much better and more energetic. This is especially true if your red blood cells have gotten low (anemic). The transfusion raises the level of the red blood cells which carry oxygen, and this usually causes an energy increase.  The nurse administering the transfusion will monitor you carefully for complications. HOME CARE INSTRUCTIONS  No special instructions are needed after a transfusion. You may find your energy is better. Speak with your caregiver about any limitations on activity for underlying diseases you may have. SEEK MEDICAL CARE IF:   Your condition is not improving after your transfusion.  You develop redness or irritation at the intravenous (IV) site. SEEK IMMEDIATE MEDICAL CARE IF:   Any of the following symptoms occur over the next 12 hours:  Shaking chills.  You have a temperature by mouth above 102 F (38.9 C), not controlled by medicine.  Chest, back, or muscle pain.  People around you feel you are not acting correctly or are confused.  Shortness of breath or difficulty breathing.  Dizziness and fainting.  You get a rash or develop hives.  You have a decrease in urine output.  Your urine turns a dark color or changes to pink, red, or brown. Any of the following symptoms occur over the next 10 days:  You have a temperature by mouth above 102 F (38.9 C), not controlled by medicine.  Shortness of breath.  Weakness after normal activity.  The white part of the eye turns yellow (jaundice).  You have a decrease in the amount of urine or are urinating less often.  Your urine turns a dark color or changes to pink, red, or brown. Document Released: 10/09/2000 Document Revised: 01/04/2012 Document Reviewed: 05/28/2008 [facility] Patient Information 2014 [place], [place].  _______________________________________________________________________

## 2018-09-09 ENCOUNTER — Ambulatory Visit: Payer: Self-pay | Admitting: Orthopedic Surgery

## 2018-09-09 ENCOUNTER — Encounter (HOSPITAL_COMMUNITY): Payer: Self-pay

## 2018-09-09 ENCOUNTER — Encounter (HOSPITAL_COMMUNITY)
Admission: RE | Admit: 2018-09-09 | Discharge: 2018-09-09 | Disposition: A | Discharge status: Home / Self Care | Payer: Medicare Other | Source: Ambulatory Visit | Attending: Orthopedic Surgery | Admitting: Orthopedic Surgery

## 2018-09-09 ENCOUNTER — Other Ambulatory Visit: Payer: Self-pay

## 2018-09-09 DIAGNOSIS — R001 Bradycardia, unspecified: Secondary | ICD-10-CM | POA: Insufficient documentation

## 2018-09-09 DIAGNOSIS — Z01818 Encounter for other preprocedural examination: Secondary | ICD-10-CM | POA: Insufficient documentation

## 2018-09-09 HISTORY — DX: Presence of spectacles and contact lenses: Z97.3

## 2018-09-09 HISTORY — DX: Complete loss of teeth, unspecified cause, unspecified class: K08.109

## 2018-09-09 HISTORY — DX: Hyperparathyroidism, unspecified: E21.3

## 2018-09-09 HISTORY — DX: Malignant neoplasm of unspecified site of left female breast: C50.912

## 2018-09-09 HISTORY — DX: Personal history of irradiation: Z92.3

## 2018-09-09 HISTORY — DX: Complete loss of teeth, unspecified cause, unspecified class: Z97.2

## 2018-09-09 LAB — CBC
HCT: 38.2 % (ref 36.0–46.0)
HEMOGLOBIN: 11.7 g/dL — AB (ref 12.0–15.0)
MCH: 29 pg (ref 26.0–34.0)
MCHC: 30.6 g/dL (ref 30.0–36.0)
MCV: 94.6 fL (ref 80.0–100.0)
NRBC: 0 % (ref 0.0–0.2)
Platelets: 294 10*3/uL (ref 150–400)
RBC: 4.04 MIL/uL (ref 3.87–5.11)
RDW: 13.9 % (ref 11.5–15.5)
WBC: 7.4 10*3/uL (ref 4.0–10.5)

## 2018-09-09 LAB — BASIC METABOLIC PANEL
ANION GAP: 7 (ref 5–15)
BUN: 32 mg/dL — ABNORMAL HIGH (ref 8–23)
CHLORIDE: 106 mmol/L (ref 98–111)
CO2: 29 mmol/L (ref 22–32)
Calcium: 10.7 mg/dL — ABNORMAL HIGH (ref 8.9–10.3)
Creatinine, Ser: 0.99 mg/dL (ref 0.44–1.00)
GFR calc non Af Amer: 54 mL/min — ABNORMAL LOW (ref >60)
Glucose, Bld: 101 mg/dL — ABNORMAL HIGH (ref 70–99)
POTASSIUM: 4.5 mmol/L (ref 3.5–5.1)
Sodium: 142 mmol/L (ref 135–145)

## 2018-09-09 NOTE — Progress Notes (Signed)
BMP result dated 09-09-2018  Routed to dr swinteck in [place].

## 2018-09-12 ENCOUNTER — Ambulatory Visit: Payer: Self-pay | Admitting: Orthopedic Surgery

## 2018-09-12 NOTE — H&P (View-Only) (Signed)
TOTAL HIP ADMISSION H&P  Patient is admitted for right total hip arthroplasty.  Subjective:  Chief Complaint: right hip pain  HPI: [name] [name], 77 y.o. female, has a history of pain and functional disability in the right hip(s) due to arthritis and patient has failed non-surgical conservative treatments for greater than 12 weeks to include NSAID's and/or analgesics, corticosteriod injections, flexibility and strengthening excercises, use of assistive devices, weight reduction as appropriate and activity modification.  Onset of symptoms was gradual starting 3 years ago with gradually worsening course since that time.The patient noted no past surgery on the right hip(s).  Patient currently rates pain in the right hip at 10 out of 10 with activity. Patient has night pain, worsening of pain with activity and weight bearing, trendelenberg gait, pain that interfers with activities of daily living, pain with passive range of motion and joint swelling. Patient has evidence of subchondral cysts, subchondral sclerosis, periarticular osteophytes and joint space narrowing by imaging studies. This condition presents safety issues increasing the risk of falls.  There is no current active infection.  Patient Active Problem List   Diagnosis Date Noted  . Personal history of colonic polyps 04/27/2013  . Anemia, iron deficiency 04/27/2013   Past Medical History:  Diagnosis Date  . Anemia   . Anxiety   . Arthritis    back, shoulders ,  right hip  . Breast cancer, left [facility]) oncologist-- dr Audelia Hives [facility] in [place])--- per lov in epic no recurrence   dx 05/ 2011---- Stage I (T1,N0M0), DCIS, ER+;  s/p  left breast lumpectomy ,  completed radiation therapy 06-02-2010,  started antiestrogen  . Full dentures   . GERD (gastroesophageal reflux disease)   . History of colon polyps 2009   Dr. Collene Mares  . History of external beam radiation therapy 04-21-2010  to 06-02-2010   left breast cancer  .  Hyperparathyroidism ([place])    per pt dx 03/ 2019,  was told by surgeon not high enough to worry about  . Hypertension   . Vitamin D deficiency   . Wears glasses     Past Surgical History:  Procedure Laterality Date  . BREAST LUMPECTOMY Left 2011  . COLONOSCOPY N/A 05/18/2013   Procedure: COLONOSCOPY;  Surgeon: Daneil Dolin, MD;  Location: [facility];  Service: Endoscopy;  Laterality: N/A;  9:30  . DILATATION & CURETTAGE/HYSTEROSCOPY WITH MYOSURE N/A 05/28/2016   Procedure: DILATATION & CURETTAGE/HYSTEROSCOPY WITH MYOSURE;  Surgeon: Servando Salina, MD;  Location: [facility];  Service: Gynecology;  Laterality: N/A;  . TOTAL KNEE ARTHROPLASTY Bilateral left 05-07-2009;  right 02-07-2010   both by dr Theda Sers @[facility]     Current Outpatient Medications  Medication Sig Dispense Refill Last Dose  . amLODipine (NORVASC) 5 MG tablet Take 10 mg by mouth at bedtime.     . benazepril (LOTENSIN) 20 MG tablet Take 40 mg by mouth every morning.    Taking  . cetirizine (ZYRTEC) 10 MG tablet Take 10 mg by mouth every morning.    Taking  . latanoprost (XALATAN) 0.005 % ophthalmic solution Place 1 drop into both eyes at bedtime.     . ranitidine (ZANTAC) 150 MG tablet Take 150 mg by mouth daily as needed for heartburn.    Taking   No current facility-administered medications for this visit.    Allergies  Allergen Reactions  . Sulfa Antibiotics Swelling and Rash    Social History   Tobacco Use  . Smoking status: Former Smoker  Years: 15.00    Types: Cigarettes    Last attempt to quit: 09/09/1974    Years since quitting: 44.0  . Smokeless tobacco: Never Used  Substance Use Topics  . Alcohol use: No    Family History  Problem Relation Age of Onset  . Breast cancer Mother   . Colon cancer Neg Hx   . Lung cancer Neg Hx   . Ovarian cancer Neg Hx      Review of Systems  Constitutional: Negative.   HENT: Negative.   Eyes: Negative.   Respiratory: Negative.   Cardiovascular: Negative.    Gastrointestinal: Negative.   Genitourinary: Negative.   Musculoskeletal: Positive for joint pain.  Skin: Negative.   Neurological: Negative.   Endo/Heme/Allergies: Negative.   Psychiatric/Behavioral: Negative.     Objective:  Physical Exam  Vitals reviewed. Constitutional: She is oriented to person, place, and time. She appears well-developed and well-nourished.  HENT:  Head: Normocephalic and atraumatic.  Eyes: Pupils are equal, round, and reactive to light. Conjunctivae and EOM are normal.  Neck: Normal range of motion. Neck supple.  Cardiovascular: Normal rate, regular rhythm and intact distal pulses.  Respiratory: Effort normal. No respiratory distress.  GI: Soft. She exhibits no distension.  Genitourinary:  Genitourinary Comments: deferred  Musculoskeletal:       Right hip: She exhibits decreased range of motion, decreased strength and tenderness.  Neurological: She is alert and oriented to person, place, and time. She has normal reflexes.  Skin: Skin is warm and dry.  Psychiatric: She has a normal mood and affect. Her behavior is normal. Judgment and thought content normal.    Vital signs in last 24 hours: @VSRANGES @  Labs:   Estimated body mass index is 42.57 kg/m as calculated from the following:   Height as of 06/06/18: 5\' 4"  (1.626 m).   Weight as of 06/06/18: 112.5 kg.   Imaging Review Plain radiographs demonstrate severe degenerative joint disease of the right hip(s). The bone quality appears to be adequate for age and reported activity level.    Preoperative templating of the joint replacement has been completed, documented, and submitted to the Operating Room personnel in order to optimize intra-operative equipment management.     Assessment/Plan:  End stage arthritis, right hip(s)  The patient history, physical examination, clinical judgement of the provider and imaging studies are consistent with end stage degenerative joint disease of the  right hip(s) and total hip arthroplasty is deemed medically necessary. The treatment options including medical management, injection therapy, arthroscopy and arthroplasty were discussed at length. The risks and benefits of total hip arthroplasty were presented and reviewed. The risks due to aseptic loosening, infection, stiffness, dislocation/subluxation,  thromboembolic complications and other imponderables were discussed.  The patient acknowledged the explanation, agreed to proceed with the plan and consent was signed. Patient is being admitted for inpatient treatment for surgery, pain control, PT, OT, prophylactic antibiotics, VTE prophylaxis, progressive ambulation and ADL's and discharge planning.The patient is planning to be discharged home with HEP. Has DME.

## 2018-09-12 NOTE — H&P (Signed)
TOTAL HIP ADMISSION H&P  Patient is admitted for right total hip arthroplasty.  Subjective:  Chief Complaint: right hip pain  HPI: [name] [name], 77 y.o. female, has a history of pain and functional disability in the right hip(s) due to arthritis and patient has failed non-surgical conservative treatments for greater than 12 weeks to include NSAID's and/or analgesics, corticosteriod injections, flexibility and strengthening excercises, use of assistive devices, weight reduction as appropriate and activity modification.  Onset of symptoms was gradual starting 3 years ago with gradually worsening course since that time.The patient noted no past surgery on the right hip(s).  Patient currently rates pain in the right hip at 10 out of 10 with activity. Patient has night pain, worsening of pain with activity and weight bearing, trendelenberg gait, pain that interfers with activities of daily living, pain with passive range of motion and joint swelling. Patient has evidence of subchondral cysts, subchondral sclerosis, periarticular osteophytes and joint space narrowing by imaging studies. This condition presents safety issues increasing the risk of falls.  There is no current active infection.  Patient Active Problem List   Diagnosis Date Noted  . Personal history of colonic polyps 04/27/2013  . Anemia, iron deficiency 04/27/2013   Past Medical History:  Diagnosis Date  . Anemia   . Anxiety   . Arthritis    back, shoulders ,  right hip  . Breast cancer, left [facility]) oncologist-- dr Audelia Hives [facility] in [place])--- per lov in epic no recurrence   dx 05/ 2011---- Stage I (T1,N0M0), DCIS, ER+;  s/p  left breast lumpectomy ,  completed radiation therapy 06-02-2010,  started antiestrogen  . Full dentures   . GERD (gastroesophageal reflux disease)   . History of colon polyps 2009   Dr. Collene Mares  . History of external beam radiation therapy 04-21-2010  to 06-02-2010   left breast cancer  .  Hyperparathyroidism ([place])    per pt dx 03/ 2019,  was told by surgeon not high enough to worry about  . Hypertension   . Vitamin D deficiency   . Wears glasses     Past Surgical History:  Procedure Laterality Date  . BREAST LUMPECTOMY Left 2011  . COLONOSCOPY N/A 05/18/2013   Procedure: COLONOSCOPY;  Surgeon: Daneil Dolin, MD;  Location: [facility];  Service: Endoscopy;  Laterality: N/A;  9:30  . DILATATION & CURETTAGE/HYSTEROSCOPY WITH MYOSURE N/A 05/28/2016   Procedure: DILATATION & CURETTAGE/HYSTEROSCOPY WITH MYOSURE;  Surgeon: Servando Salina, MD;  Location: [facility];  Service: Gynecology;  Laterality: N/A;  . TOTAL KNEE ARTHROPLASTY Bilateral left 05-07-2009;  right 02-07-2010   both by dr Theda Sers @[facility]     Current Outpatient Medications  Medication Sig Dispense Refill Last Dose  . amLODipine (NORVASC) 5 MG tablet Take 10 mg by mouth at bedtime.     . benazepril (LOTENSIN) 20 MG tablet Take 40 mg by mouth every morning.    Taking  . cetirizine (ZYRTEC) 10 MG tablet Take 10 mg by mouth every morning.    Taking  . latanoprost (XALATAN) 0.005 % ophthalmic solution Place 1 drop into both eyes at bedtime.     . ranitidine (ZANTAC) 150 MG tablet Take 150 mg by mouth daily as needed for heartburn.    Taking   No current facility-administered medications for this visit.    Allergies  Allergen Reactions  . Sulfa Antibiotics Swelling and Rash    Social History   Tobacco Use  . Smoking status: Former Smoker  Years: 15.00    Types: Cigarettes    Last attempt to quit: 09/09/1974    Years since quitting: 44.0  . Smokeless tobacco: Never Used  Substance Use Topics  . Alcohol use: No    Family History  Problem Relation Age of Onset  . Breast cancer Mother   . Colon cancer Neg Hx   . Lung cancer Neg Hx   . Ovarian cancer Neg Hx      Review of Systems  Constitutional: Negative.   HENT: Negative.   Eyes: Negative.   Respiratory: Negative.   Cardiovascular: Negative.    Gastrointestinal: Negative.   Genitourinary: Negative.   Musculoskeletal: Positive for joint pain.  Skin: Negative.   Neurological: Negative.   Endo/Heme/Allergies: Negative.   Psychiatric/Behavioral: Negative.     Objective:  Physical Exam  Vitals reviewed. Constitutional: She is oriented to person, place, and time. She appears well-developed and well-nourished.  HENT:  Head: Normocephalic and atraumatic.  Eyes: Pupils are equal, round, and reactive to light. Conjunctivae and EOM are normal.  Neck: Normal range of motion. Neck supple.  Cardiovascular: Normal rate, regular rhythm and intact distal pulses.  Respiratory: Effort normal. No respiratory distress.  GI: Soft. She exhibits no distension.  Genitourinary:  Genitourinary Comments: deferred  Musculoskeletal:       Right hip: She exhibits decreased range of motion, decreased strength and tenderness.  Neurological: She is alert and oriented to person, place, and time. She has normal reflexes.  Skin: Skin is warm and dry.  Psychiatric: She has a normal mood and affect. Her behavior is normal. Judgment and thought content normal.    Vital signs in last 24 hours: @VSRANGES @  Labs:   Estimated body mass index is 42.57 kg/m as calculated from the following:   Height as of 06/06/18: 5\' 4"  (1.626 m).   Weight as of 06/06/18: 112.5 kg.   Imaging Review Plain radiographs demonstrate severe degenerative joint disease of the right hip(s). The bone quality appears to be adequate for age and reported activity level.    Preoperative templating of the joint replacement has been completed, documented, and submitted to the Operating Room personnel in order to optimize intra-operative equipment management.     Assessment/Plan:  End stage arthritis, right hip(s)  The patient history, physical examination, clinical judgement of the provider and imaging studies are consistent with end stage degenerative joint disease of the  right hip(s) and total hip arthroplasty is deemed medically necessary. The treatment options including medical management, injection therapy, arthroscopy and arthroplasty were discussed at length. The risks and benefits of total hip arthroplasty were presented and reviewed. The risks due to aseptic loosening, infection, stiffness, dislocation/subluxation,  thromboembolic complications and other imponderables were discussed.  The patient acknowledged the explanation, agreed to proceed with the plan and consent was signed. Patient is being admitted for inpatient treatment for surgery, pain control, PT, OT, prophylactic antibiotics, VTE prophylaxis, progressive ambulation and ADL's and discharge planning.The patient is planning to be discharged home with HEP. Has DME.

## 2018-09-13 NOTE — Progress Notes (Signed)
Final EKG done 09/09/2018 - epic

## 2018-09-14 MED ORDER — TRANEXAMIC ACID-NACL 1000-0.7 MG/100ML-% IV SOLN
1000.0000 mg | INTRAVENOUS | Status: AC
  Administered 2018-09-15: 1000 mg via INTRAVENOUS
  Filled 2018-09-14: qty 100

## 2018-09-14 NOTE — Anesthesia Preprocedure Evaluation (Addendum)
Anesthesia Evaluation  Patient identified by MRN, date of birth, ID band Patient awake    Reviewed: Allergy & Precautions, NPO status , Patient's Chart, lab work & pertinent test results  History of Anesthesia Complications Negative for: history of anesthetic complications  Airway Mallampati: III  TM Distance: >3 FB Neck ROM: Full    Dental no notable dental hx. (+) Dental Advisory Given   Pulmonary former smoker,    Pulmonary exam normal        Cardiovascular hypertension, Pt. on medications Normal cardiovascular exam     Neuro/Psych Anxiety negative neurological ROS     GI/Hepatic Neg liver ROS, GERD  Medicated,  Endo/Other  Morbid obesity  Renal/GU negative Renal ROS     Musculoskeletal negative musculoskeletal ROS (+)   Abdominal   Peds  Hematology negative hematology ROS (+)   Anesthesia Other Findings Day of surgery medications reviewed with the patient.  Reproductive/Obstetrics                            Anesthesia Physical Anesthesia Plan  ASA: III  Anesthesia Plan: Spinal   Post-op Pain Management:    Induction:   PONV Risk Score and Plan: 2 and Ondansetron and Propofol infusion  Airway Management Planned: Natural Airway and Simple Face Mask  Additional Equipment:   Intra-op Plan:   Post-operative Plan:   Informed Consent: I have reviewed the patients History and Physical, chart, labs and discussed the procedure including the risks, benefits and alternatives for the proposed anesthesia with the patient or authorized representative who has indicated his/her understanding and acceptance.   Dental advisory given  Plan Discussed with: CRNA, Anesthesiologist and Surgeon  Anesthesia Plan Comments:        Anesthesia Quick Evaluation

## 2018-09-15 ENCOUNTER — Inpatient Hospital Stay (HOSPITAL_COMMUNITY): Payer: Medicare Other

## 2018-09-15 ENCOUNTER — Inpatient Hospital Stay (HOSPITAL_COMMUNITY): Payer: Medicare Other | Admitting: Anesthesiology

## 2018-09-15 ENCOUNTER — Encounter (HOSPITAL_COMMUNITY)
Admission: RE | Disposition: A | Discharge status: Home / Self Care | Payer: Self-pay | Source: Home / Self Care | Attending: Orthopedic Surgery

## 2018-09-15 ENCOUNTER — Inpatient Hospital Stay (HOSPITAL_COMMUNITY)
Admission: RE | Admit: 2018-09-15 | Discharge: 2018-09-16 | DRG: 470 | Disposition: A | Discharge status: Home / Self Care | Payer: Medicare Other | Attending: Orthopedic Surgery | Admitting: Orthopedic Surgery

## 2018-09-15 ENCOUNTER — Other Ambulatory Visit: Payer: Self-pay

## 2018-09-15 ENCOUNTER — Encounter (HOSPITAL_COMMUNITY): Payer: Self-pay | Admitting: *Deleted

## 2018-09-15 DIAGNOSIS — F419 Anxiety disorder, unspecified: Secondary | ICD-10-CM | POA: Diagnosis present

## 2018-09-15 DIAGNOSIS — Z882 Allergy status to sulfonamides status: Secondary | ICD-10-CM | POA: Diagnosis not present

## 2018-09-15 DIAGNOSIS — M25559 Pain in unspecified hip: Secondary | ICD-10-CM

## 2018-09-15 DIAGNOSIS — Z6841 Body Mass Index (BMI) 40.0 and over, adult: Secondary | ICD-10-CM | POA: Diagnosis not present

## 2018-09-15 DIAGNOSIS — E559 Vitamin D deficiency, unspecified: Secondary | ICD-10-CM | POA: Diagnosis not present

## 2018-09-15 DIAGNOSIS — Z96653 Presence of artificial knee joint, bilateral: Secondary | ICD-10-CM | POA: Diagnosis present

## 2018-09-15 DIAGNOSIS — Z923 Personal history of irradiation: Secondary | ICD-10-CM | POA: Diagnosis not present

## 2018-09-15 DIAGNOSIS — K219 Gastro-esophageal reflux disease without esophagitis: Secondary | ICD-10-CM | POA: Diagnosis present

## 2018-09-15 DIAGNOSIS — I10 Essential (primary) hypertension: Secondary | ICD-10-CM | POA: Diagnosis present

## 2018-09-15 DIAGNOSIS — Z803 Family history of malignant neoplasm of breast: Secondary | ICD-10-CM | POA: Diagnosis not present

## 2018-09-15 DIAGNOSIS — E213 Hyperparathyroidism, unspecified: Secondary | ICD-10-CM | POA: Diagnosis present

## 2018-09-15 DIAGNOSIS — Z87891 Personal history of nicotine dependence: Secondary | ICD-10-CM | POA: Diagnosis not present

## 2018-09-15 DIAGNOSIS — Z79899 Other long term (current) drug therapy: Secondary | ICD-10-CM

## 2018-09-15 DIAGNOSIS — Z853 Personal history of malignant neoplasm of breast: Secondary | ICD-10-CM | POA: Diagnosis not present

## 2018-09-15 DIAGNOSIS — Z8719 Personal history of other diseases of the digestive system: Secondary | ICD-10-CM

## 2018-09-15 DIAGNOSIS — Z973 Presence of spectacles and contact lenses: Secondary | ICD-10-CM | POA: Diagnosis not present

## 2018-09-15 DIAGNOSIS — Z09 Encounter for follow-up examination after completed treatment for conditions other than malignant neoplasm: Secondary | ICD-10-CM

## 2018-09-15 DIAGNOSIS — M1611 Unilateral primary osteoarthritis, right hip: Principal | ICD-10-CM | POA: Diagnosis present

## 2018-09-15 HISTORY — PX: TOTAL HIP ARTHROPLASTY: SHX124

## 2018-09-15 LAB — TYPE AND SCREEN
ABO/RH(D): O POS
Antibody Screen: NEGATIVE

## 2018-09-15 SURGERY — ARTHROPLASTY, HIP, TOTAL, ANTERIOR APPROACH
Anesthesia: Spinal | Site: Hip | Laterality: Right

## 2018-09-15 MED ORDER — SODIUM CHLORIDE 0.9 % IV SOLN
INTRAVENOUS | Status: DC
  Administered 2018-09-15 (×2): via INTRAVENOUS

## 2018-09-15 MED ORDER — KETOROLAC TROMETHAMINE 15 MG/ML IJ SOLN
INTRAMUSCULAR | Status: AC
  Filled 2018-09-15: qty 1

## 2018-09-15 MED ORDER — BUPIVACAINE IN DEXTROSE 0.75-8.25 % IT SOLN
INTRATHECAL | Status: DC | PRN
  Administered 2018-09-15: 12 mg via INTRATHECAL

## 2018-09-15 MED ORDER — DEXAMETHASONE SODIUM PHOSPHATE 10 MG/ML IJ SOLN
10.0000 mg | Freq: Once | INTRAMUSCULAR | Status: AC
  Administered 2018-09-16: 10 mg via INTRAVENOUS
  Filled 2018-09-15: qty 1

## 2018-09-15 MED ORDER — WATER FOR IRRIGATION, STERILE IR SOLN
Status: DC | PRN
  Administered 2018-09-15: 2000 mL

## 2018-09-15 MED ORDER — ALUM & MAG HYDROXIDE-SIMETH 200-200-20 MG/5ML PO SUSP: 30.0000 mL | ORAL | Status: DC | PRN

## 2018-09-15 MED ORDER — PHENYLEPHRINE HCL 10 MG/ML IJ SOLN
INTRAMUSCULAR | Status: DC | PRN
  Administered 2018-09-15: 80 ug via INTRAVENOUS
  Administered 2018-09-15: 120 ug via INTRAVENOUS

## 2018-09-15 MED ORDER — MORPHINE SULFATE (PF) 4 MG/ML IV SOLN: 0.5000 mg | INTRAVENOUS | Status: DC | PRN

## 2018-09-15 MED ORDER — PROPOFOL 500 MG/50ML IV EMUL
INTRAVENOUS | Status: DC | PRN
  Administered 2018-09-15: 80 ug/kg/min via INTRAVENOUS

## 2018-09-15 MED ORDER — MIDAZOLAM HCL 2 MG/2ML IJ SOLN
INTRAMUSCULAR | Status: AC
  Filled 2018-09-15: qty 2

## 2018-09-15 MED ORDER — ONDANSETRON HCL 4 MG/2ML IJ SOLN: 4.0000 mg | Freq: Four times a day (QID) | INTRAMUSCULAR | Status: DC | PRN

## 2018-09-15 MED ORDER — METOCLOPRAMIDE HCL 5 MG PO TABS: 5.0000 mg | ORAL_TABLET | Freq: Three times a day (TID) | ORAL | Status: DC | PRN

## 2018-09-15 MED ORDER — HYDROCODONE-ACETAMINOPHEN 7.5-325 MG PO TABS: 1.0000 | ORAL_TABLET | ORAL | Status: DC | PRN

## 2018-09-15 MED ORDER — DEXAMETHASONE SODIUM PHOSPHATE 10 MG/ML IJ SOLN
INTRAMUSCULAR | Status: DC | PRN
  Administered 2018-09-15: 10 mg via INTRAVENOUS

## 2018-09-15 MED ORDER — ONDANSETRON HCL 4 MG PO TABS: 4.0000 mg | ORAL_TABLET | Freq: Four times a day (QID) | ORAL | Status: DC | PRN

## 2018-09-15 MED ORDER — KETOROLAC TROMETHAMINE 30 MG/ML IJ SOLN
INTRAMUSCULAR | Status: AC
  Filled 2018-09-15: qty 1

## 2018-09-15 MED ORDER — CEFAZOLIN SODIUM-DEXTROSE 2-4 GM/100ML-% IV SOLN
2.0000 g | INTRAVENOUS | Status: AC
  Administered 2018-09-15: 2 g via INTRAVENOUS
  Filled 2018-09-15: qty 100

## 2018-09-15 MED ORDER — DOCUSATE SODIUM 100 MG PO CAPS
100.0000 mg | ORAL_CAPSULE | Freq: Two times a day (BID) | ORAL | Status: DC
  Administered 2018-09-15 – 2018-09-16 (×2): 100 mg via ORAL
  Filled 2018-09-15 (×2): qty 1

## 2018-09-15 MED ORDER — ACETAMINOPHEN 10 MG/ML IV SOLN
1000.0000 mg | INTRAVENOUS | Status: AC
  Administered 2018-09-15: 1000 mg via INTRAVENOUS
  Filled 2018-09-15: qty 100

## 2018-09-15 MED ORDER — FAMOTIDINE 20 MG PO TABS
20.0000 mg | ORAL_TABLET | Freq: Two times a day (BID) | ORAL | Status: DC
  Administered 2018-09-15 – 2018-09-16 (×2): 20 mg via ORAL
  Filled 2018-09-15 (×2): qty 1

## 2018-09-15 MED ORDER — SODIUM CHLORIDE (PF) 0.9 % IJ SOLN
INTRAMUSCULAR | Status: DC | PRN
  Administered 2018-09-15: 30 mL

## 2018-09-15 MED ORDER — PROPOFOL 10 MG/ML IV BOLUS
INTRAVENOUS | Status: AC
  Filled 2018-09-15: qty 40

## 2018-09-15 MED ORDER — ISOPROPYL ALCOHOL 70 % SOLN
Status: DC | PRN
  Administered 2018-09-15: 1 via TOPICAL

## 2018-09-15 MED ORDER — LIDOCAINE 2% (20 MG/ML) 5 ML SYRINGE
INTRAMUSCULAR | Status: AC
  Filled 2018-09-15: qty 5

## 2018-09-15 MED ORDER — CHLORHEXIDINE GLUCONATE 4 % EX LIQD: 60.0000 mL | Freq: Once | CUTANEOUS | Status: DC

## 2018-09-15 MED ORDER — HYDROCODONE-ACETAMINOPHEN 5-325 MG PO TABS
1.0000 | ORAL_TABLET | ORAL | Status: DC | PRN
  Administered 2018-09-15: 1 via ORAL
  Filled 2018-09-15: qty 1

## 2018-09-15 MED ORDER — PROMETHAZINE HCL 25 MG/ML IJ SOLN: 6.2500 mg | INTRAMUSCULAR | Status: DC | PRN

## 2018-09-15 MED ORDER — METOCLOPRAMIDE HCL 5 MG/ML IJ SOLN: 5.0000 mg | Freq: Three times a day (TID) | INTRAMUSCULAR | Status: DC | PRN

## 2018-09-15 MED ORDER — BUPIVACAINE-EPINEPHRINE 0.25% -1:200000 IJ SOLN
INTRAMUSCULAR | Status: DC | PRN
  Administered 2018-09-15: 30 mL

## 2018-09-15 MED ORDER — MIDAZOLAM HCL 5 MG/5ML IJ SOLN
INTRAMUSCULAR | Status: DC | PRN
  Administered 2018-09-15: 2 mg via INTRAVENOUS

## 2018-09-15 MED ORDER — ASPIRIN 81 MG PO CHEW
81.0000 mg | CHEWABLE_TABLET | Freq: Two times a day (BID) | ORAL | Status: DC
  Administered 2018-09-15 – 2018-09-16 (×2): 81 mg via ORAL
  Filled 2018-09-15 (×2): qty 1

## 2018-09-15 MED ORDER — DEXAMETHASONE SODIUM PHOSPHATE 10 MG/ML IJ SOLN
INTRAMUSCULAR | Status: AC
  Filled 2018-09-15: qty 1

## 2018-09-15 MED ORDER — FENTANYL CITRATE (PF) 100 MCG/2ML IJ SOLN
INTRAMUSCULAR | Status: AC
  Filled 2018-09-15: qty 2

## 2018-09-15 MED ORDER — FENTANYL CITRATE (PF) 100 MCG/2ML IJ SOLN
INTRAMUSCULAR | Status: DC | PRN
  Administered 2018-09-15: 100 ug via INTRAVENOUS

## 2018-09-15 MED ORDER — LACTATED RINGERS IV SOLN
INTRAVENOUS | Status: DC | PRN
  Administered 2018-09-15 (×2): via INTRAVENOUS

## 2018-09-15 MED ORDER — ACETAMINOPHEN 325 MG PO TABS: 325.0000 mg | ORAL_TABLET | Freq: Four times a day (QID) | ORAL | Status: DC | PRN

## 2018-09-15 MED ORDER — METHOCARBAMOL 500 MG PO TABS
500.0000 mg | ORAL_TABLET | Freq: Four times a day (QID) | ORAL | Status: DC | PRN
  Administered 2018-09-15 – 2018-09-16 (×3): 500 mg via ORAL
  Filled 2018-09-15 (×2): qty 1

## 2018-09-15 MED ORDER — BUPIVACAINE-EPINEPHRINE (PF) 0.25% -1:200000 IJ SOLN
INTRAMUSCULAR | Status: AC
  Filled 2018-09-15: qty 30

## 2018-09-15 MED ORDER — BENAZEPRIL HCL 20 MG PO TABS
40.0000 mg | ORAL_TABLET | Freq: Every day | ORAL | Status: DC
  Administered 2018-09-15 – 2018-09-16 (×2): 40 mg via ORAL
  Filled 2018-09-15 (×2): qty 4
  Filled 2018-09-15 (×2): qty 2

## 2018-09-15 MED ORDER — METHOCARBAMOL 500 MG IVPB - SIMPLE MED
500.0000 mg | Freq: Four times a day (QID) | INTRAVENOUS | Status: DC | PRN
  Filled 2018-09-15: qty 50

## 2018-09-15 MED ORDER — SODIUM CHLORIDE 0.9 % IV SOLN: INTRAVENOUS | Status: DC

## 2018-09-15 MED ORDER — AMLODIPINE BESYLATE 10 MG PO TABS
10.0000 mg | ORAL_TABLET | Freq: Every day | ORAL | Status: DC
  Administered 2018-09-15: 10 mg via ORAL
  Filled 2018-09-15: qty 1

## 2018-09-15 MED ORDER — POLYETHYLENE GLYCOL 3350 17 G PO PACK: 17.0000 g | PACK | Freq: Every day | ORAL | Status: DC | PRN

## 2018-09-15 MED ORDER — PROPOFOL 10 MG/ML IV BOLUS
INTRAVENOUS | Status: AC
  Filled 2018-09-15: qty 60

## 2018-09-15 MED ORDER — PROPOFOL 500 MG/50ML IV EMUL: INTRAVENOUS | Status: DC | PRN

## 2018-09-15 MED ORDER — DIPHENHYDRAMINE HCL 12.5 MG/5ML PO ELIX: 12.5000 mg | ORAL_SOLUTION | ORAL | Status: DC | PRN

## 2018-09-15 MED ORDER — LORATADINE 10 MG PO TABS
10.0000 mg | ORAL_TABLET | Freq: Every day | ORAL | Status: DC
  Administered 2018-09-16: 10 mg via ORAL
  Filled 2018-09-15: qty 1

## 2018-09-15 MED ORDER — LATANOPROST 0.005 % OP SOLN
1.0000 [drp] | Freq: Every day | OPHTHALMIC | Status: DC
  Administered 2018-09-15: 1 [drp] via OPHTHALMIC
  Filled 2018-09-15: qty 2.5

## 2018-09-15 MED ORDER — KETOROLAC TROMETHAMINE 15 MG/ML IJ SOLN
7.5000 mg | Freq: Four times a day (QID) | INTRAMUSCULAR | Status: AC
  Administered 2018-09-15: 7.5 mg via INTRAVENOUS

## 2018-09-15 MED ORDER — ONDANSETRON HCL 4 MG/2ML IJ SOLN
INTRAMUSCULAR | Status: DC | PRN
  Administered 2018-09-15: 4 mg via INTRAVENOUS

## 2018-09-15 MED ORDER — SODIUM CHLORIDE 0.9 % IR SOLN
Status: DC | PRN
  Administered 2018-09-15: 3000 mL
  Administered 2018-09-15: 1000 mL

## 2018-09-15 MED ORDER — KETOROLAC TROMETHAMINE 30 MG/ML IJ SOLN
INTRAMUSCULAR | Status: DC | PRN
  Administered 2018-09-15: 30 mg

## 2018-09-15 MED ORDER — SENNA 8.6 MG PO TABS
1.0000 | ORAL_TABLET | Freq: Two times a day (BID) | ORAL | Status: DC
  Administered 2018-09-15 – 2018-09-16 (×2): 8.6 mg via ORAL
  Filled 2018-09-15 (×2): qty 1

## 2018-09-15 MED ORDER — FENTANYL CITRATE (PF) 100 MCG/2ML IJ SOLN: 25.0000 ug | INTRAMUSCULAR | Status: DC | PRN

## 2018-09-15 MED ORDER — SODIUM CHLORIDE (PF) 0.9 % IJ SOLN
INTRAMUSCULAR | Status: AC
  Filled 2018-09-15: qty 50

## 2018-09-15 MED ORDER — METHOCARBAMOL 500 MG PO TABS
ORAL_TABLET | ORAL | Status: AC
  Filled 2018-09-15: qty 1

## 2018-09-15 MED ORDER — POVIDONE-IODINE 10 % EX SWAB
2.0000 "application " | Freq: Once | CUTANEOUS | Status: AC
  Administered 2018-09-15: 2 via TOPICAL

## 2018-09-15 MED ORDER — PHENOL 1.4 % MT LIQD
1.0000 | OROMUCOSAL | Status: DC | PRN
  Filled 2018-09-15: qty 177

## 2018-09-15 MED ORDER — ONDANSETRON HCL 4 MG/2ML IJ SOLN
INTRAMUSCULAR | Status: AC
  Filled 2018-09-15: qty 2

## 2018-09-15 MED ORDER — CEFAZOLIN SODIUM-DEXTROSE 2-4 GM/100ML-% IV SOLN
2.0000 g | Freq: Four times a day (QID) | INTRAVENOUS | Status: AC
  Administered 2018-09-15 (×2): 2 g via INTRAVENOUS
  Filled 2018-09-15 (×2): qty 100

## 2018-09-15 MED ORDER — ISOPROPYL ALCOHOL 70 % SOLN
Status: AC
  Filled 2018-09-15: qty 480

## 2018-09-15 MED ORDER — MENTHOL 3 MG MT LOZG: 1.0000 | LOZENGE | OROMUCOSAL | Status: DC | PRN

## 2018-09-15 MED ORDER — EPHEDRINE SULFATE 50 MG/ML IJ SOLN
INTRAMUSCULAR | Status: DC | PRN
  Administered 2018-09-15 (×2): 10 mg via INTRAVENOUS
  Administered 2018-09-15: 5 mg via INTRAVENOUS

## 2018-09-15 MED ORDER — PHENYLEPHRINE 40 MCG/ML (10ML) SYRINGE FOR IV PUSH (FOR BLOOD PRESSURE SUPPORT)
PREFILLED_SYRINGE | INTRAVENOUS | Status: AC
  Filled 2018-09-15: qty 20

## 2018-09-15 SURGICAL SUPPLY — 47 items
BAG DECANTER FOR FLEXI CONT (MISCELLANEOUS) IMPLANT
BAG ZIPLOCK 12X15 (MISCELLANEOUS) IMPLANT
CHLORAPREP W/TINT 26ML (MISCELLANEOUS) ×2 IMPLANT
CLOTH BEACON ORANGE TIMEOUT ST (SAFETY) ×2 IMPLANT
COVER PERINEAL POST (MISCELLANEOUS) ×2 IMPLANT
COVER SURGICAL LIGHT HANDLE (MISCELLANEOUS) ×2 IMPLANT
COVER WAND RF STERILE (DRAPES) ×2 IMPLANT
CUP SECTOR GRIPTON 50MM (Cup) ×2 IMPLANT
DECANTER SPIKE VIAL GLASS SM (MISCELLANEOUS) ×2 IMPLANT
DERMABOND ADVANCED (GAUZE/BANDAGES/DRESSINGS) ×2
DERMABOND ADVANCED .7 DNX12 (GAUZE/BANDAGES/DRESSINGS) ×2 IMPLANT
DRAPE SHEET LG 3/4 BI-LAMINATE (DRAPES) ×6 IMPLANT
DRAPE STERI IOBAN 125X83 (DRAPES) ×2 IMPLANT
DRAPE U-SHAPE 47X51 STRL (DRAPES) ×4 IMPLANT
DRSG AQUACEL AG ADV 3.5X10 (GAUZE/BANDAGES/DRESSINGS) ×2 IMPLANT
ELECT PENCIL ROCKER SW 15FT (MISCELLANEOUS) ×2 IMPLANT
ELECT REM PT RETURN 15FT ADLT (MISCELLANEOUS) ×2 IMPLANT
GAUZE SPONGE 4X4 12PLY STRL (GAUZE/BANDAGES/DRESSINGS) ×2 IMPLANT
GLOVE BIO SURGEON STRL SZ8.5 (GLOVE) ×4 IMPLANT
GLOVE BIOGEL PI IND STRL 8.5 (GLOVE) ×1 IMPLANT
GLOVE BIOGEL PI INDICATOR 8.5 (GLOVE) ×1
GOWN SPEC L3 XXLG W/TWL (GOWN DISPOSABLE) ×2 IMPLANT
HANDPIECE INTERPULSE COAX TIP (DISPOSABLE) ×1
HEAD FEMORAL 32 CERAMIC (Hips) ×2 IMPLANT
HOLDER FOLEY CATH W/STRAP (MISCELLANEOUS) ×2 IMPLANT
HOOD PEEL AWAY FLYTE STAYCOOL (MISCELLANEOUS) ×8 IMPLANT
LINER ACETABULAR 32X50 (Liner) ×2 IMPLANT
MARKER SKIN DUAL TIP RULER LAB (MISCELLANEOUS) ×2 IMPLANT
NEEDLE SPNL 18GX3.5 QUINCKE PK (NEEDLE) ×2 IMPLANT
PACK ANTERIOR HIP CUSTOM (KITS) ×2 IMPLANT
SAW OSC TIP CART 19.5X105X1.3 (SAW) ×2 IMPLANT
SEALER BIPOLAR AQUA 6.0 (INSTRUMENTS) ×2 IMPLANT
SET HNDPC FAN SPRY TIP SCT (DISPOSABLE) ×1 IMPLANT
STEM TRI LOC BPS SZ4 W GRIPTON (Hips) ×1 IMPLANT
SUT ETHIBOND NAB CT1 #1 30IN (SUTURE) ×4 IMPLANT
SUT MNCRL AB 3-0 PS2 18 (SUTURE) ×2 IMPLANT
SUT MON AB 2-0 CT1 36 (SUTURE) ×4 IMPLANT
SUT STRATAFIX PDO 1 14 VIOLET (SUTURE) ×1
SUT STRATFX PDO 1 14 VIOLET (SUTURE) ×1
SUT VIC AB 2-0 CT1 27 (SUTURE) ×1
SUT VIC AB 2-0 CT1 TAPERPNT 27 (SUTURE) ×1 IMPLANT
SUTURE STRATFX PDO 1 14 VIOLET (SUTURE) ×1 IMPLANT
SYR 50ML LL SCALE MARK (SYRINGE) ×2 IMPLANT
TRAY FOLEY CATH 14FR (SET/KITS/TRAYS/PACK) ×2 IMPLANT
TRI LOC BPS SZ 4 W GRIPTON (Hips) ×2 IMPLANT
WATER STERILE IRR 1000ML POUR (IV SOLUTION) ×2 IMPLANT
YANKAUER SUCT BULB TIP 10FT TU (MISCELLANEOUS) ×2 IMPLANT

## 2018-09-15 NOTE — Anesthesia Postprocedure Evaluation (Signed)
Anesthesia Post Note  Patient: [name] [name]  Procedure(s) Performed: TOTAL HIP ARTHROPLASTY ANTERIOR APPROACH (Right Hip)     Patient location during evaluation: PACU Anesthesia Type: Spinal Level of consciousness: awake and alert Pain management: pain level controlled Vital Signs Assessment: post-procedure vital signs reviewed and stable Respiratory status: spontaneous breathing and respiratory function stable Cardiovascular status: blood pressure returned to baseline and stable Postop Assessment: spinal receding Anesthetic complications: no    Last Vitals:  Vitals:   09/15/18 1045 09/15/18 1100  BP: (!) 117/53 125/90  Pulse: 66 68  Resp: 10 17  Temp:  (!) 36.3 C  SpO2: 99% 95%    Last Pain:  Vitals:   09/15/18 1100  TempSrc:   PainSc: 0-No pain                 [name] DANIEL

## 2018-09-15 NOTE — Discharge Instructions (Signed)
°Dr. Tiara Maultsby °Joint Replacement Specialist °[facility] °[address] °[place], [place] [address] °[phone] ° ° °TOTAL HIP REPLACEMENT POSTOPERATIVE DIRECTIONS ° ° ° °Hip Rehabilitation, Guidelines Following Surgery  ° °WEIGHT BEARING °Weight bearing as tolerated with assist device (walker, cane, etc) as directed, use it as long as suggested by your surgeon or therapist, typically at least 4-6 weeks. ° °The results of a hip operation are greatly improved after range of motion and muscle strengthening exercises. Follow all safety measures which are given to protect your hip. If any of these exercises cause increased pain or swelling in your joint, decrease the amount until you are comfortable again. Then slowly increase the exercises. Call your caregiver if you have problems or questions.  ° °HOME CARE INSTRUCTIONS  °Most of the following instructions are designed to prevent the dislocation of your new hip.  °Remove items at home which could result in a fall. This includes throw rugs or furniture in walking pathways.  °Continue medications as instructed at time of discharge. °· You may have some home medications which will be placed on hold until you complete the course of blood thinner medication. °· You may start showering once you are discharged home. Do not remove your dressing. °Do not put on socks or shoes without following the instructions of your caregivers.   °Sit on chairs with arms. Use the chair arms to help push yourself up when arising.  °Arrange for the use of a toilet seat elevator so you are not sitting low.  °· Walk with walker as instructed.  °You may resume a sexual relationship in one month or when given the OK by your caregiver.  °Use walker as long as suggested by your caregivers.  °You may put full weight on your legs and walk as much as is comfortable. °Avoid periods of inactivity such as sitting longer than an hour when not asleep. This helps prevent  blood clots.  °You may return to work once you are cleared by your surgeon.  °Do not drive a car for 6 weeks or until released by your surgeon.  °Do not drive while taking narcotics.  °Wear elastic stockings for two weeks following surgery during the day but you may remove then at night.  °Make sure you keep all of your appointments after your operation with all of your doctors and caregivers. You should call the office at the above phone number and make an appointment for approximately two weeks after the date of your surgery. °Please pick up a stool softener and laxative for home use as long as you are requiring pain medications. °· ICE to the affected hip every three hours for 30 minutes at a time and then as needed for pain and swelling. Continue to use ice on the hip for pain and swelling from surgery. You may notice swelling that will progress down to the foot and ankle.  This is normal after surgery.  Elevate the leg when you are not up walking on it.   °It is important for you to complete the blood thinner medication as prescribed by your doctor. °· Continue to use the breathing machine which will help keep your temperature down.  It is common for your temperature to cycle up and down following surgery, especially at night when you are not up moving around and exerting yourself.  The breathing machine keeps your lungs expanded and your temperature down. ° °RANGE OF MOTION AND STRENGTHENING EXERCISES  °These exercises are   designed to help you keep full movement of your hip joint. Follow your caregiver's or physical therapist's instructions. Perform all exercises about fifteen times, three times per day or as directed. Exercise both hips, even if you have had only one joint replacement. These exercises can be done on a training (exercise) mat, on the floor, on a table or on a bed. Use whatever works the best and is most comfortable for you. Use music or television while you are exercising so that the exercises  are a pleasant break in your day. This will make your life better with the exercises acting as a break in routine you can look forward to.  °Lying on your back, slowly slide your foot toward your buttocks, raising your knee up off the floor. Then slowly slide your foot back down until your leg is straight again.  °Lying on your back spread your legs as far apart as you can without causing discomfort.  °Lying on your side, raise your upper leg and foot straight up from the floor as far as is comfortable. Slowly lower the leg and repeat.  °Lying on your back, tighten up the muscle in the front of your thigh (quadriceps muscles). You can do this by keeping your leg straight and trying to raise your heel off the floor. This helps strengthen the largest muscle supporting your knee.  °Lying on your back, tighten up the muscles of your buttocks both with the legs straight and with the knee bent at a comfortable angle while keeping your heel on the floor.  ° °SKILLED REHAB INSTRUCTIONS: °If the patient is transferred to a skilled rehab facility following release from the hospital, a list of the current medications will be sent to the facility for the patient to continue.  When discharged from the skilled rehab facility, please have the facility set up the patient's Home Health Physical Therapy prior to being released. Also, the skilled facility will be responsible for providing the patient with their medications at time of release from the facility to include their pain medication and their blood thinner medication. If the patient is still at the rehab facility at time of the two week follow up appointment, the skilled rehab facility will also need to assist the patient in arranging follow up appointment in our office and any transportation needs. ° °MAKE SURE YOU:  °Understand these instructions.  °Will watch your condition.  °Will get help right away if you are not doing well or get worse. ° °Pick up stool softner and  laxative for home use following surgery while on pain medications. °Do not remove your dressing. °The dressing is waterproof--it is OK to take showers. °Continue to use ice for pain and swelling after surgery. °Do not use any lotions or creams on the incision until instructed by your surgeon. °Total Hip Protocol. ° ° °

## 2018-09-15 NOTE — Evaluation (Signed)
Physical Therapy Evaluation Patient Details Name: NECOLE MINASSIAN MRN: [id number] DOB: 1941/01/18 Today's Date: 09/15/2018   History of Present Illness  Pt s/p R THR and with hx of bil TKR and breast CA  Clinical Impression  Pt s/p R THR and presents with decreased R LE strength/ROM and post op pain limiting functional mobility.  Pt should progress to return home with family assist.    Follow Up Recommendations Follow surgeon's recommendation for DC plan and follow-up therapies    Equipment Recommendations  None recommended by PT    Recommendations for Other Services       Precautions / Restrictions Precautions Precautions: Fall Restrictions Weight Bearing Restrictions: No Other Position/Activity Restrictions: WBAT      Mobility  Bed Mobility Overal bed mobility: Needs Assistance Bed Mobility: Supine to Sit     Supine to sit: Min assist     General bed mobility comments: cues for sequence and use of L LE to self assist`  Transfers Overall transfer level: Needs assistance Equipment used: Rolling walker (2 wheeled) Transfers: Sit to/from Stand Sit to Stand: Min assist         General transfer comment: cues for LE management and use of UEs to self assist  Ambulation/Gait Ambulation/Gait assistance: Min assist Gait Distance (Feet): 75 Feet Assistive device: Rolling walker (2 wheeled) Gait Pattern/deviations: Step-to pattern;Decreased step length - right;Decreased step length - left;Shuffle;Trunk flexed Gait velocity: decr   General Gait Details: cues for sequence, posture and position from [employer]            Wheelchair Mobility    Modified Rankin (Stroke Patients Only)       Balance Overall balance assessment: Mild deficits observed, not formally tested                                           Pertinent Vitals/Pain Pain Assessment: 0-10 Pain Score: 2  Pain Location: R hip Pain Descriptors / Indicators:  Aching;Sore Pain Intervention(s): Limited activity within patient's tolerance;Monitored during session;Premedicated before session;Ice applied    Home Living Family/patient expects to be discharged to:: Private residence Living Arrangements: Children Available Help at Discharge: Family Type of Home: House Home Access: [place]: One [place]: Environmental consultant - 2 wheels      Prior Function Level of Independence: Independent;Independent with assistive device(s)               Hand Dominance        Extremity/Trunk Assessment   Upper Extremity Assessment Upper Extremity Assessment: Overall WFL for tasks assessed    Lower Extremity Assessment Lower Extremity Assessment: RLE deficits/detail       Communication   Communication: No difficulties  Cognition Arousal/Alertness: Awake/alert Behavior During Therapy: WFL for tasks assessed/performed Overall Cognitive Status: Within Functional Limits for tasks assessed                                        General Comments      Exercises Total Joint Exercises Ankle Circles/Pumps: AROM;Both;20 reps;Supine   Assessment/Plan    PT Assessment Patient needs continued PT services  PT Problem List Decreased strength;Decreased range of motion;Decreased activity tolerance;Decreased mobility;Pain;Obesity       PT Treatment Interventions DME instruction;Gait training;Stair training;Functional  mobility training;Therapeutic activities;Patient/family education;Therapeutic exercise    PT Goals (Current goals can be found in the Care Plan section)  Acute Rehab PT Goals Patient Stated Goal: Regain IND PT Goal Formulation: With patient Time For Goal Achievement: 09/22/18 Potential to Achieve Goals: Good    Frequency 7X/week   Barriers to discharge        Co-evaluation               AM-PAC PT "6 Clicks" Daily Activity  Outcome Measure Difficulty turning over in bed (including  adjusting bedclothes, sheets and blankets)?: Unable Difficulty moving from lying on back to sitting on the side of the bed? : Unable Difficulty sitting down on and standing up from a chair with arms (e.g., wheelchair, bedside commode, etc,.)?: Unable Help needed moving to and from a bed to chair (including a wheelchair)?: A Little Help needed walking in hospital room?: A Little Help needed climbing 3-5 steps with a railing? : A Lot 6 Click Score: 11    End of Session Equipment Utilized During Treatment: Gait belt Activity Tolerance: Patient tolerated treatment well Patient left: in chair;with call bell/phone within reach;with family/visitor present Nurse Communication: Mobility status PT Visit Diagnosis: Muscle weakness (generalized) (M62.81)    Time: 8675-4492 PT Time Calculation (min) (ACUTE ONLY): 23 min   Charges:   PT Evaluation $PT Eval Low Complexity: 1 Low PT Treatments $Gait Training: 8-22 mins        [place] Pager [phone] Office [phone]   Edison Nicholson 09/15/2018, 5:35 PM

## 2018-09-15 NOTE — Transfer of Care (Signed)
Immediate Anesthesia Transfer of Care Note  Patient: [name] [name]  Procedure(s) Performed: TOTAL HIP ARTHROPLASTY ANTERIOR APPROACH (Right Hip)  Patient Location: PACU  Anesthesia Type:Spinal  Level of Consciousness: awake, alert  and oriented  Airway & Oxygen Therapy: Patient Spontanous Breathing and Patient connected to nasal cannula oxygen  Post-op Assessment: Report given to RN and Post -op Vital signs reviewed and stable  Post vital signs: Reviewed and stable  Last Vitals:  Vitals Value Taken Time  BP    Temp    Pulse    Resp    SpO2      Last Pain:  Vitals:   09/15/18 0619  TempSrc:   PainSc: 0-No pain         Complications: No apparent anesthesia complications

## 2018-09-15 NOTE — Interval H&P Note (Signed)
History and Physical Interval Note:  09/15/2018 7:51 AM  [name]  has presented today for surgery, with the diagnosis of Degenerative joint disease right hip  The various methods of treatment have been discussed with the patient and family. After consideration of risks, benefits and other options for treatment, the patient has consented to  Procedure(s): TOTAL HIP ARTHROPLASTY ANTERIOR APPROACH (Right) as a surgical intervention .  The patient's history has been reviewed, patient examined, no change in status, stable for surgery.  I have reviewed the patient's chart and labs.  Questions were answered to the patient's satisfaction.     Hilton Cork Rosenda Geffrard

## 2018-09-15 NOTE — Op Note (Signed)
OPERATIVE REPORT  SURGEON: Rod Can, MD   ASSISTANT: Nehemiah Massed, PA-C.  PREOPERATIVE DIAGNOSIS: Right hip arthritis.   POSTOPERATIVE DIAGNOSIS: Right hip arthritis.   PROCEDURE: Right total hip arthroplasty, anterior approach.   IMPLANTS: DePuy Tri Lock stem, size 4, hi offset. DePuy Pinnacle Cup, size 50 mm. DePuy Altrx liner, size 32 by 50 mm, neutral. DePuy Biolox ceramic head ball, size 32 + 1 mm.  ANESTHESIA:  MAC and Spinal  ESTIMATED BLOOD LOSS:-150 mL    ANTIBIOTICS: 2 g Ancef.  DRAINS: None.  COMPLICATIONS: None.   CONDITION: PACU - hemodynamically stable.   BRIEF CLINICAL NOTE: [name] [name] is a 77 y.o. female with a long-standing history of Right hip arthritis. After failing conservative management, the patient was indicated for total hip arthroplasty. The risks, benefits, and alternatives to the procedure were explained, and the patient elected to proceed.  PROCEDURE IN DETAIL: Surgical site was marked by myself in the pre-op holding area. Once inside the operating room, spinal anesthesia was obtained, and a foley catheter was inserted. The patient was then positioned on the Hana table. All bony prominences were well padded. The hip was prepped and draped in the normal sterile surgical fashion. A time-out was called verifying side and site of surgery. The patient received IV antibiotics within 60 minutes of beginning the procedure.  The direct anterior approach to the hip was performed through the Hueter interval. Lateral femoral circumflex vessels were treated with the Auqumantys. The anterior capsule was exposed and an inverted T capsulotomy was made.The femoral neck cut was made to the level of the templated cut. A corkscrew was placed into the head and the head was removed. The femoral head was found to have eburnated bone. The head was passed to the back table and was measured.  Acetabular exposure was achieved, and the pulvinar and  labrum were excised. Sequential reaming of the acetabulum was then performed up to a size 49 mm reamer. A 50 mm cup was then opened and impacted into place at approximately 40 degrees of abduction and 20 degrees of anteversion. The final polyethylene liner was impacted into place and acetabular osteophytes were removed.   I then gained femoral exposure taking care to protect the abductors and greater trochanter. This was performed using standard external rotation, extension, and adduction. The capsule was peeled off the inner aspect of the greater trochanter, taking care to preserve the short external rotators. A cookie cutter was used to enter the femoral canal, and then the femoral canal finder was placed. Sequential broaching was performed up to a size 4. Calcar planer was used on the femoral neck remnant. I placed a hi offset neck and a trial head ball. The hip was reduced. Leg lengths and offset were checked fluoroscopically. The hip was dislocated and trial components were removed. The final implants were placed, and the hip was reduced.  Fluoroscopy was used to confirm component position and leg lengths. At 90 degrees of external rotation and full extension, the hip was stable to an anterior directed force.  The wound was copiously irrigated with normal saline using pulse lavage. Marcaine solution was injected into the periarticular soft tissue. The wound was closed in layers using #1 Vicryl and V-Loc for the fascia, 2-0 Vicryl for the subcutaneous fat, 2-0 Monocryl for the deep dermal layer, 3-0 running Monocryl subcuticular stitch, and Dermabond for the skin. Once the glue was fully dried, an Aquacell Ag dressing was applied. The patient was transported to the  recovery room in stable condition. Sponge, needle, and instrument counts were correct at the end of the case x2. The patient tolerated the procedure well and there were no known complications.  Please note that a surgical  assistant was a medical necessity for this procedure to perform it in a safe and expeditious manner. Assistant was necessary to provide appropriate retraction of vital neurovascular structures, to prevent femoral fracture, and to allow for anatomic placement of the prosthesis.

## 2018-09-15 NOTE — Anesthesia Procedure Notes (Signed)
Spinal  Patient location during procedure: OR Start time: 09/15/2018 7:56 AM End time: 09/15/2018 8:04 AM Staffing Anesthesiologist: Duane Boston, MD Performed: anesthesiologist  Preanesthetic Checklist Completed: patient identified, surgical consent, pre-op evaluation, timeout performed, IV checked, risks and benefits discussed and monitors and equipment checked Spinal Block Patient position: sitting Prep: DuraPrep Patient monitoring: cardiac monitor, continuous pulse ox and blood pressure Approach: midline Location: L2-3 Injection technique: single-shot Needle Needle type: Pencan  Needle gauge: 24 G Needle length: 9 cm Additional Notes Functioning IV was confirmed and monitors were applied. Sterile prep and drape, including hand hygiene and sterile gloves were used. The patient was positioned and the spine was prepped. The skin was anesthetized with lidocaine.  Free flow of clear CSF was obtained prior to injecting local anesthetic into the CSF.  The spinal needle aspirated freely following injection.  The needle was carefully withdrawn.  The patient tolerated the procedure well.

## 2018-09-16 ENCOUNTER — Encounter (HOSPITAL_COMMUNITY): Payer: Self-pay | Admitting: Orthopedic Surgery

## 2018-09-16 LAB — BASIC METABOLIC PANEL
Anion gap: 5 (ref 5–15)
BUN: 25 mg/dL — ABNORMAL HIGH (ref 8–23)
CALCIUM: 10 mg/dL (ref 8.9–10.3)
CHLORIDE: 110 mmol/L (ref 98–111)
CO2: 24 mmol/L (ref 22–32)
CREATININE: 1.01 mg/dL — AB (ref 0.44–1.00)
GFR calc non Af Amer: 53 mL/min — ABNORMAL LOW (ref >60)
Glucose, Bld: 146 mg/dL — ABNORMAL HIGH (ref 70–99)
Potassium: 4.4 mmol/L (ref 3.5–5.1)
SODIUM: 139 mmol/L (ref 135–145)

## 2018-09-16 LAB — CBC
HCT: 32.9 % — ABNORMAL LOW (ref 36.0–46.0)
HEMOGLOBIN: 10.2 g/dL — AB (ref 12.0–15.0)
MCH: 29.4 pg (ref 26.0–34.0)
MCHC: 31 g/dL (ref 30.0–36.0)
MCV: 94.8 fL (ref 80.0–100.0)
Platelets: 253 10*3/uL (ref 150–400)
RBC: 3.47 MIL/uL — ABNORMAL LOW (ref 3.87–5.11)
RDW: 14 % (ref 11.5–15.5)
WBC: 9 10*3/uL (ref 4.0–10.5)
nRBC: 0 % (ref 0.0–0.2)

## 2018-09-16 MED ORDER — ONDANSETRON HCL 4 MG PO TABS: 4.0000 mg | ORAL_TABLET | Freq: Four times a day (QID) | ORAL | 0 refills | Status: DC | PRN

## 2018-09-16 MED ORDER — ASPIRIN 81 MG PO CHEW: 81.0000 mg | CHEWABLE_TABLET | Freq: Two times a day (BID) | ORAL | 1 refills | Status: DC

## 2018-09-16 MED ORDER — HYDROCODONE-ACETAMINOPHEN 5-325 MG PO TABS: 1.0000 | ORAL_TABLET | Freq: Four times a day (QID) | ORAL | 0 refills | Status: DC | PRN

## 2018-09-16 MED ORDER — DOCUSATE SODIUM 100 MG PO CAPS: 100.0000 mg | ORAL_CAPSULE | Freq: Two times a day (BID) | ORAL | 1 refills | Status: DC

## 2018-09-16 MED ORDER — SENNA 8.6 MG PO TABS: 1.0000 | ORAL_TABLET | Freq: Two times a day (BID) | ORAL | 0 refills | Status: DC

## 2018-09-16 NOTE — Progress Notes (Signed)
Physical Therapy Treatment Patient Details Name: GEORGIE EDUARDO MRN: [id number] DOB: 1941-02-07 Today's Date: 09/16/2018    History of Present Illness Pt s/p R THR and with hx of bil TKR and breast CA    PT Comments    Pt progressing well with mobility and hopeful for dc home today.   Follow Up Recommendations  Follow surgeon's recommendation for DC plan and follow-up therapies     Equipment Recommendations  None recommended by PT    Recommendations for Other Services       Precautions / Restrictions Precautions Precautions: Fall Restrictions Weight Bearing Restrictions: No Other Position/Activity Restrictions: WBAT    Mobility  Bed Mobility               General bed mobility comments: Pt up in chair and requests to return to same.  Reviewed techniques for in/out bed  Transfers Overall transfer level: Needs assistance Equipment used: Rolling walker (2 wheeled) Transfers: Sit to/from Stand Sit to Stand: Min guard;Supervision         General transfer comment: cues for LE management and use of UEs to self assist  Ambulation/Gait Ambulation/Gait assistance: Min guard;Supervision Gait Distance (Feet): 150 Feet Assistive device: Rolling walker (2 wheeled) Gait Pattern/deviations: Step-to pattern;Decreased step length - right;Decreased step length - left;Shuffle;Trunk flexed Gait velocity: decr   General Gait Details: min cues for sequence, posture and position from [employer]             Wheelchair Mobility    Modified Rankin (Stroke Patients Only)       Balance Overall balance assessment: Mild deficits observed, not formally tested                                          Cognition Arousal/Alertness: Awake/alert Behavior During Therapy: WFL for tasks assessed/performed Overall Cognitive Status: Within Functional Limits for tasks assessed                                        Exercises Total  Joint Exercises Ankle Circles/Pumps: AROM;Both;20 reps;Supine Quad Sets: AROM;Both;10 reps;Supine Heel Slides: AAROM;Right;20 reps;Supine Hip ABduction/ADduction: AAROM;Right;15 reps;Supine Long Arc Quad: AROM;Right;10 reps;Seated    General Comments        Pertinent Vitals/Pain Pain Assessment: Faces Faces Pain Scale: Hurts a little bit Pain Location: R hip Pain Descriptors / Indicators: Aching;Sore Pain Intervention(s): Limited activity within patient's tolerance;Monitored during session;Premedicated before session;Ice applied    Home Living                      Prior Function            PT Goals (current goals can now be found in the care plan section) Acute Rehab PT Goals Patient Stated Goal: Regain IND PT Goal Formulation: With patient Time For Goal Achievement: 09/22/18 Potential to Achieve Goals: Good Progress towards PT goals: Progressing toward goals    Frequency    7X/week      PT Plan Current plan remains appropriate    Co-evaluation              AM-PAC PT "6 Clicks" Daily Activity  Outcome Measure  Difficulty turning over in bed (including adjusting bedclothes, sheets and blankets)?: A Lot Difficulty moving from lying on back  to sitting on the side of the bed? : Unable Difficulty sitting down on and standing up from a chair with arms (e.g., wheelchair, bedside commode, etc,.)?: A Lot Help needed moving to and from a bed to chair (including a wheelchair)?: A Little Help needed walking in hospital room?: A Little Help needed climbing 3-5 steps with a railing? : A Little 6 Click Score: 14    End of Session Equipment Utilized During Treatment: Gait belt Activity Tolerance: Patient tolerated treatment well Patient left: in chair;with call bell/phone within reach;with family/visitor present Nurse Communication: Mobility status PT Visit Diagnosis: Muscle weakness (generalized) (M62.81)     Time: 0932-3557 PT Time Calculation (min)  (ACUTE ONLY): 30 min  Charges:  $Gait Training: 8-22 mins $Therapeutic Exercise: 8-22 mins                     [place] Pager [phone] Office [phone]    Jorryn Casagrande 09/16/2018, 10:37 AM

## 2018-09-16 NOTE — Progress Notes (Signed)
Physical Therapy Treatment Patient Details Name: [name] [name] MRN: [id number] DOB: [birth year] Today's Date: 09/16/2018    History of Present Illness Pt s/p R THR and with hx of bil TKR and breast CA    PT Comments    Pt performed home therex program with assist and written instruction provided with progressing.     Follow Up Recommendations  Follow surgeon's recommendation for DC plan and follow-up therapies     Equipment Recommendations  None recommended by PT    Recommendations for Other Services       Precautions / Restrictions Precautions Precautions: Fall Restrictions Weight Bearing Restrictions: No Other Position/Activity Restrictions: WBAT    Mobility  Bed Mobility               General bed mobility comments: Pt up in chair and requests to return to same.  Reviewed techniques for in/out bed  Transfers Overall transfer level: Needs assistance Equipment used: Rolling walker (2 wheeled) Transfers: Sit to/from Stand Sit to Stand: Min guard;Supervision         General transfer comment: cues for LE management and use of UEs to self assist  Ambulation/Gait Ambulation/Gait assistance: Min guard;Supervision Gait Distance (Feet): 150 Feet Assistive device: Rolling walker (2 wheeled) Gait Pattern/deviations: Step-to pattern;Decreased step length - right;Decreased step length - left;Shuffle;Trunk flexed Gait velocity: decr   General Gait Details: min cues for sequence, posture and position from [employer]             Wheelchair Mobility    Modified Rankin (Stroke Patients Only)       Balance Overall balance assessment: Mild deficits observed, not formally tested                                          Cognition Arousal/Alertness: Awake/alert Behavior During Therapy: WFL for tasks assessed/performed Overall Cognitive Status: Within Functional Limits for tasks assessed                                         Exercises Total Joint Exercises Ankle Circles/Pumps: AROM;Both;20 reps;Supine Quad Sets: AROM;Both;10 reps;Supine Heel Slides: AAROM;Right;20 reps;Supine Hip ABduction/ADduction: AAROM;Right;15 reps;Supine Long Arc Quad: AROM;Right;10 reps;Seated    General Comments        Pertinent Vitals/Pain Pain Assessment: 0-10 Pain Score: 3  Faces Pain Scale: Hurts a little bit Pain Location: R hip Pain Descriptors / Indicators: Aching;Sore Pain Intervention(s): Limited activity within patient's tolerance;Monitored during session;Premedicated before session;Ice applied    Home Living                      Prior Function            PT Goals (current goals can now be found in the care plan section) Acute Rehab PT Goals Patient Stated Goal: Regain IND PT Goal Formulation: With patient Time For Goal Achievement: 09/22/18 Potential to Achieve Goals: Good Progress towards PT goals: Progressing toward goals    Frequency    7X/week      PT Plan Current plan remains appropriate    Co-evaluation              AM-PAC PT "6 Clicks" Daily Activity  Outcome Measure  Difficulty turning over in bed (including adjusting bedclothes, sheets and blankets)?:  A Lot Difficulty moving from lying on back to sitting on the side of the bed? : Unable Difficulty sitting down on and standing up from a chair with arms (e.g., wheelchair, bedside commode, etc,.)?: A Lot Help needed moving to and from a bed to chair (including a wheelchair)?: A Little Help needed walking in hospital room?: A Little Help needed climbing 3-5 steps with a railing? : A Little 6 Click Score: 14    End of Session Equipment Utilized During Treatment: Gait belt Activity Tolerance: Patient tolerated treatment well Patient left: in chair;with call bell/phone within reach;with family/visitor present Nurse Communication: Mobility status PT Visit Diagnosis: Muscle weakness (generalized) (M62.81)      Time: 5697-9480 PT Time Calculation (min) (ACUTE ONLY): 28 min  Charges:  $Gait Training: 8-22 mins $Therapeutic Exercise: 8-22 mins                     [place] Pager [phone] Office [phone]     Anes Rigel 09/16/2018, 1:10 PM

## 2018-09-16 NOTE — Progress Notes (Signed)
    Subjective:  Patient reports pain as mild to moderate.  Denies N/V/CP/SOB. No c/o.  Objective:   VITALS:   Vitals:   09/15/18 2116 09/16/18 0148 09/16/18 0538 09/16/18 0923  BP: (!) 135/56 (!) 136/55 (!) 134/58 (!) 136/55  Pulse: 73 65 64 65  Resp: 17  17   Temp: 97.9 F (36.6 C) 98.2 F (36.8 C) 98.3 F (36.8 C)   TempSrc: Oral Oral Oral   SpO2: 96% 95% 98% 100%  Weight:      Height:        NAD ABD soft Sensation intact distally Intact pulses distally Dorsiflexion/Plantar flexion intact Incision: dressing C/D/I Compartment soft   Lab Results  Component Value Date   WBC 9.0 09/16/2018   HGB 10.2 (L) 09/16/2018   HCT 32.9 (L) 09/16/2018   MCV 94.8 09/16/2018   PLT 253 09/16/2018   BMET    Component Value Date/Time   NA 139 09/16/2018 0440   K 4.4 09/16/2018 0440   CL 110 09/16/2018 0440   CO2 24 09/16/2018 0440   GLUCOSE 146 (H) 09/16/2018 0440   BUN 25 (H) 09/16/2018 0440   BUN 24 (A) 03/17/2013   CREATININE 1.01 (H) 09/16/2018 0440   CREATININE 1.03 03/17/2013   CALCIUM 10.0 09/16/2018 0440   GFRNONAA 53 (L) 09/16/2018 0440   GFRAA >60 09/16/2018 0440     Assessment/Plan: 1 Day Post-Op   Principal Problem:   Osteoarthritis of right hip   WBAT with walker DVT ppx: Aspirin, SCDs, TEDS PO pain control PT/OT Dispo: D/C home with HEP  Hilton Cork Hewitt Garner 09/16/2018, 1:43 PM   Rod Can, MD Cell: [phone] [place] is now [facility]  [place] [address], [place], [place] [address] Phone: [phone] [email or web address] Facebook  [employer]

## 2018-09-16 NOTE — Discharge Summary (Signed)
Physician Discharge Summary  Patient ID: [name] [name] MRN: [id number] DOB/AGE: Aug 05, 1941 77 y.o.  Admit date: 09/15/2018 Discharge date: 09/16/2018  Admission Diagnoses:  Osteoarthritis of right hip  Discharge Diagnoses:  Principal Problem:   Osteoarthritis of right hip   Past Medical History:  Diagnosis Date  . Anemia   . Anxiety   . Arthritis    back, shoulders ,  right hip  . 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 Full dentures   . GERD (gastroesophageal reflux disease)   . History of colon polyps 2009   Dr. Collene Mares  . History of external beam radiation therapy 04-21-2010  to 06-02-2010   left breast cancer  . Hyperparathyroidism ([place])    per pt dx 03/ 2019,  was told by surgeon not high enough to worry about  . Hypertension   . Vitamin D deficiency   . Wears glasses     Surgeries: Procedure(s): TOTAL HIP ARTHROPLASTY ANTERIOR APPROACH on 09/15/2018   Consultants (if any):   Discharged Condition: Improved  Hospital Course: [name] [name] is an 76 y.o. female who was admitted 09/15/2018 with a diagnosis of Osteoarthritis of right hip and went to the operating room on 09/15/2018 and underwent the above named procedures.    She was given perioperative antibiotics:  Anti-infectives (From admission, onward)   Start     Dose/Rate Route Frequency Ordered Stop   09/15/18 1400  ceFAZolin (ANCEF) IVPB 2g/100 mL premix     2 g 200 mL/hr over 30 Minutes Intravenous Every 6 hours 09/15/18 1146 09/15/18 2105   09/15/18 0600  ceFAZolin (ANCEF) IVPB 2g/100 mL premix     2 g 200 mL/hr over 30 Minutes Intravenous On call to O.R. 09/15/18 0102 09/15/18 0753    .  She was given sequential compression devices, early ambulation, and ASA for DVT  prophylaxis.  She benefited maximally from the hospital stay and there were no complications.    Recent vital signs:  Vitals:   09/16/18 0538 09/16/18 0923  BP: (!) 134/58 (!) 136/55  Pulse: 64 65  Resp: 17   Temp: 98.3 F (36.8 C)   SpO2: 98% 100%    Recent laboratory studies:  Lab Results  Component Value Date   HGB 10.2 (L) 09/16/2018   HGB 11.7 (L) 09/09/2018   HGB 11.2 (L) 05/25/2016   Lab Results  Component Value Date   WBC 9.0 09/16/2018   PLT 253 09/16/2018   Lab Results  Component Value Date   INR 0.98 02/03/2010   Lab Results  Component Value Date   NA 139 09/16/2018   K 4.4 09/16/2018   CL 110 09/16/2018   CO2 24 09/16/2018   BUN 25 (H) 09/16/2018   CREATININE 1.01 (H) 09/16/2018   GLUCOSE 146 (H) 09/16/2018    Discharge Medications:   Allergies as of 09/16/2018      Reactions   Sulfa Antibiotics Swelling, Rash      Medication List    TAKE these medications   amLODipine 5 MG tablet Commonly known as:  NORVASC Take 10 mg by mouth at bedtime.   aspirin 81 MG chewable tablet Chew 1 tablet (81 mg total) by mouth 2 (two) times daily.   benazepril 20 MG tablet Commonly known as:  LOTENSIN Take 40 mg by mouth every morning.   cetirizine 10 MG tablet Commonly known as:  ZYRTEC Take 10 mg by mouth every morning.   docusate sodium 100 MG capsule Commonly known as:  COLACE Take 1 capsule (100 mg total) by mouth 2 (two) times daily.   HYDROcodone-acetaminophen 5-325 MG tablet Commonly known as:  NORCO/VICODIN Take 1-2 tablets by mouth every 6 (six) hours as needed for moderate pain.   latanoprost 0.005 % ophthalmic solution Commonly known as:  XALATAN Place 1 drop into both eyes at bedtime.   ondansetron 4 MG tablet Commonly known as:  ZOFRAN Take 1 tablet (4 mg total) by mouth every 6 (six) hours as needed for nausea.   ranitidine 150 MG tablet Commonly known as:  ZANTAC Take 150 mg by mouth daily as needed for heartburn.   senna  8.6 MG Tabs tablet Commonly known as:  SENOKOT Take 1 tablet (8.6 mg total) by mouth 2 (two) times daily.       Diagnostic Studies: Dg Pelvis Portable  Result Date: 09/15/2018 CLINICAL DATA:  Total right hip replacement. EXAM: PORTABLE PELVIS 1-2 VIEWS COMPARISON:  09/15/2018. FINDINGS: Total right hip replacement. Anatomic alignment. Hardware intact. No acute bony abnormality. IMPRESSION: Total right hip replacement with anatomic alignment. Electronically Signed   By: Marcello Moores  Register   On: 09/15/2018 10:51   Dg C-arm 1-60 Min-no Report  Result Date: 09/15/2018 Fluoroscopy was utilized by the requesting physician.  No radiographic interpretation.   Dg Hip Operative Unilat W Or W/o Pelvis Right  Result Date: 09/15/2018 CLINICAL DATA:  Right hip replacement EXAM: OPERATIVE RIGHT HIP (WITH PELVIS IF PERFORMED) 3 VIEWS TECHNIQUE: Fluoroscopic spot image(s) were submitted for interpretation post-operatively. COMPARISON:  None. FINDINGS: Multiple intraoperative spot images demonstrate changes of right hip replacement. Normal AP alignment. No visible hardware or bony complicating feature. IMPRESSION: Right hip replacement.  No visible complicating feature. Electronically Signed   By: Rolm Baptise M.D.   On: 09/15/2018 09:53    Disposition: Discharge disposition: 01-Home or Self Care       Discharge Instructions    Call MD / Call 911   Complete by:  As directed    If you experience chest pain or shortness of breath, CALL 911 and be transported to the hospital emergency room.  If you develope a fever above 101 F, pus (white drainage) or increased drainage or redness at the wound, or calf pain, call your surgeon's office.   Constipation Prevention   Complete by:  As directed    Drink plenty of fluids.  Prune juice may be helpful.  You may use a stool softener, such as Colace (over the counter) 100 mg twice a day.  Use MiraLax (over the counter) for constipation as needed.   Diet - low  sodium heart healthy   Complete by:  As directed    Driving restrictions   Complete by:  As directed    No driving for 6 weeks   Increase activity slowly as tolerated   Complete by:  As directed    Lifting restrictions   Complete by:  As directed    No lifting for 6 weeks   TED hose   Complete by:  As directed    Use stockings (TED hose) for 2 weeks on both leg(s).  You may remove them at night for sleeping.      Follow-up Information    Don Tiu, Aaron Edelman, MD. Schedule an appointment as soon as possible for a  visit in 2 weeks.   Specialty:  Orthopedic Surgery Why:  For wound re-check Contact information: [address] [place] [address] [phone]            Signed: Hilton Cork Hattie Aguinaldo 09/16/2018, 1:46 PM

## 2018-09-16 NOTE — Plan of Care (Signed)

## 2018-10-03 ENCOUNTER — Ambulatory Visit: Payer: Self-pay | Admitting: Orthopedic Surgery

## 2018-10-04 ENCOUNTER — Other Ambulatory Visit: Payer: Self-pay

## 2018-10-04 ENCOUNTER — Encounter (HOSPITAL_COMMUNITY): Payer: Self-pay | Admitting: *Deleted

## 2018-10-06 ENCOUNTER — Encounter (HOSPITAL_COMMUNITY): Payer: Self-pay | Admitting: *Deleted

## 2018-10-06 ENCOUNTER — Encounter (HOSPITAL_COMMUNITY)
Admission: RE | Disposition: A | Discharge status: Home / Self Care | Payer: Self-pay | Source: Home / Self Care | Attending: Orthopedic Surgery

## 2018-10-06 ENCOUNTER — Ambulatory Visit (HOSPITAL_COMMUNITY): Payer: Medicare Other | Admitting: Anesthesiology

## 2018-10-06 ENCOUNTER — Observation Stay (HOSPITAL_COMMUNITY)
Admission: RE | Admit: 2018-10-06 | Discharge: 2018-10-07 | Disposition: A | Discharge status: Home / Self Care | Payer: Medicare Other | Attending: Orthopedic Surgery | Admitting: Orthopedic Surgery

## 2018-10-06 DIAGNOSIS — Z87891 Personal history of nicotine dependence: Secondary | ICD-10-CM | POA: Insufficient documentation

## 2018-10-06 DIAGNOSIS — Y831 Surgical operation with implant of artificial internal device as the cause of abnormal reaction of the patient, or of later complication, without mention of misadventure at the time of the procedure: Secondary | ICD-10-CM | POA: Diagnosis not present

## 2018-10-06 DIAGNOSIS — Z86000 Personal history of in-situ neoplasm of breast: Secondary | ICD-10-CM | POA: Diagnosis not present

## 2018-10-06 DIAGNOSIS — Z7982 Long term (current) use of aspirin: Secondary | ICD-10-CM | POA: Diagnosis not present

## 2018-10-06 DIAGNOSIS — Z79899 Other long term (current) drug therapy: Secondary | ICD-10-CM | POA: Insufficient documentation

## 2018-10-06 DIAGNOSIS — Z96641 Presence of right artificial hip joint: Secondary | ICD-10-CM | POA: Diagnosis not present

## 2018-10-06 DIAGNOSIS — K219 Gastro-esophageal reflux disease without esophagitis: Secondary | ICD-10-CM | POA: Diagnosis not present

## 2018-10-06 DIAGNOSIS — Z6839 Body mass index (BMI) 39.0-39.9, adult: Secondary | ICD-10-CM | POA: Diagnosis not present

## 2018-10-06 DIAGNOSIS — I10 Essential (primary) hypertension: Secondary | ICD-10-CM | POA: Diagnosis not present

## 2018-10-06 DIAGNOSIS — Z882 Allergy status to sulfonamides status: Secondary | ICD-10-CM | POA: Diagnosis not present

## 2018-10-06 DIAGNOSIS — Z96653 Presence of artificial knee joint, bilateral: Secondary | ICD-10-CM | POA: Insufficient documentation

## 2018-10-06 DIAGNOSIS — T8131XA Disruption of external operation (surgical) wound, not elsewhere classified, initial encounter: Principal | ICD-10-CM | POA: Diagnosis present

## 2018-10-06 HISTORY — PX: INCISION AND DRAINAGE HIP: SHX1801

## 2018-10-06 HISTORY — PX: APPLICATION OF WOUND VAC: SHX5189

## 2018-10-06 LAB — CBC
HEMATOCRIT: 37.9 % (ref 36.0–46.0)
Hemoglobin: 10.7 g/dL — ABNORMAL LOW (ref 12.0–15.0)
MCH: 28.5 pg (ref 26.0–34.0)
MCHC: 28.2 g/dL — AB (ref 30.0–36.0)
MCV: 101.1 fL — ABNORMAL HIGH (ref 80.0–100.0)
NRBC: 0 % (ref 0.0–0.2)
PLATELETS: 346 10*3/uL (ref 150–400)
RBC: 3.75 MIL/uL — AB (ref 3.87–5.11)
RDW: 13.9 % (ref 11.5–15.5)
WBC: 6.2 10*3/uL (ref 4.0–10.5)

## 2018-10-06 LAB — BASIC METABOLIC PANEL
Anion gap: 8 (ref 5–15)
BUN: 13 mg/dL (ref 8–23)
CO2: 24 mmol/L (ref 22–32)
Calcium: 10.7 mg/dL — ABNORMAL HIGH (ref 8.9–10.3)
Chloride: 106 mmol/L (ref 98–111)
Creatinine, Ser: 0.81 mg/dL (ref 0.44–1.00)
GFR calc non Af Amer: >60 mL/min (ref >60)
Glucose, Bld: 94 mg/dL (ref 70–99)
Potassium: 4.3 mmol/L (ref 3.5–5.1)
Sodium: 138 mmol/L (ref 135–145)

## 2018-10-06 SURGERY — IRRIGATION AND DEBRIDEMENT HIP
Anesthesia: General | Site: Hip | Laterality: Right

## 2018-10-06 MED ORDER — LORATADINE 10 MG PO TABS
10.0000 mg | ORAL_TABLET | Freq: Every day | ORAL | Status: DC
  Administered 2018-10-07: 10 mg via ORAL
  Filled 2018-10-06: qty 1

## 2018-10-06 MED ORDER — HYDROCODONE-ACETAMINOPHEN 5-325 MG PO TABS: 1.0000 | ORAL_TABLET | ORAL | Status: DC | PRN

## 2018-10-06 MED ORDER — FENTANYL CITRATE (PF) 100 MCG/2ML IJ SOLN
INTRAMUSCULAR | Status: DC | PRN
  Administered 2018-10-06: 100 ug via INTRAVENOUS
  Administered 2018-10-06 (×2): 50 ug via INTRAVENOUS

## 2018-10-06 MED ORDER — SODIUM CHLORIDE 0.9 % IV SOLN
INTRAVENOUS | Status: AC
  Filled 2018-10-06: qty 500000

## 2018-10-06 MED ORDER — SODIUM CHLORIDE 0.9 % IV SOLN
INTRAVENOUS | Status: DC | PRN
  Administered 2018-10-06: 500 mL

## 2018-10-06 MED ORDER — AMLODIPINE BESYLATE 10 MG PO TABS
10.0000 mg | ORAL_TABLET | Freq: Every day | ORAL | Status: DC
  Administered 2018-10-06: 10 mg via ORAL
  Filled 2018-10-06: qty 1

## 2018-10-06 MED ORDER — BENAZEPRIL HCL 10 MG PO TABS
40.0000 mg | ORAL_TABLET | Freq: Every morning | ORAL | Status: DC
  Administered 2018-10-07: 40 mg via ORAL
  Filled 2018-10-06: qty 4

## 2018-10-06 MED ORDER — PROPOFOL 10 MG/ML IV BOLUS
INTRAVENOUS | Status: AC
  Filled 2018-10-06: qty 20

## 2018-10-06 MED ORDER — FENTANYL CITRATE (PF) 100 MCG/2ML IJ SOLN
INTRAMUSCULAR | Status: AC
  Filled 2018-10-06: qty 2

## 2018-10-06 MED ORDER — ALBUTEROL SULFATE HFA 108 (90 BASE) MCG/ACT IN AERS
INHALATION_SPRAY | RESPIRATORY_TRACT | Status: DC | PRN
  Administered 2018-10-06: 2 via RESPIRATORY_TRACT
  Administered 2018-10-06: 4 via RESPIRATORY_TRACT
  Administered 2018-10-06 (×2): 2 via RESPIRATORY_TRACT

## 2018-10-06 MED ORDER — LIDOCAINE 2% (20 MG/ML) 5 ML SYRINGE
INTRAMUSCULAR | Status: AC
  Filled 2018-10-06: qty 5

## 2018-10-06 MED ORDER — DEXAMETHASONE SODIUM PHOSPHATE 10 MG/ML IJ SOLN
INTRAMUSCULAR | Status: DC | PRN
  Administered 2018-10-06: 10 mg via INTRAVENOUS

## 2018-10-06 MED ORDER — ASPIRIN 81 MG PO CHEW
81.0000 mg | CHEWABLE_TABLET | Freq: Two times a day (BID) | ORAL | Status: DC
  Administered 2018-10-06 – 2018-10-07 (×2): 81 mg via ORAL
  Filled 2018-10-06 (×2): qty 1

## 2018-10-06 MED ORDER — ONDANSETRON HCL 4 MG/2ML IJ SOLN: 4.0000 mg | Freq: Four times a day (QID) | INTRAMUSCULAR | Status: DC | PRN

## 2018-10-06 MED ORDER — CEFAZOLIN SODIUM-DEXTROSE 2-4 GM/100ML-% IV SOLN
2.0000 g | Freq: Four times a day (QID) | INTRAVENOUS | Status: AC
  Administered 2018-10-06 – 2018-10-07 (×3): 2 g via INTRAVENOUS
  Filled 2018-10-06 (×3): qty 100

## 2018-10-06 MED ORDER — ONDANSETRON HCL 4 MG PO TABS: 4.0000 mg | ORAL_TABLET | Freq: Four times a day (QID) | ORAL | Status: DC | PRN

## 2018-10-06 MED ORDER — HYDROCODONE-ACETAMINOPHEN 7.5-325 MG PO TABS: 1.0000 | ORAL_TABLET | ORAL | Status: DC | PRN

## 2018-10-06 MED ORDER — METOCLOPRAMIDE HCL 5 MG PO TABS: 5.0000 mg | ORAL_TABLET | Freq: Three times a day (TID) | ORAL | Status: DC | PRN

## 2018-10-06 MED ORDER — SUCCINYLCHOLINE CHLORIDE 200 MG/10ML IV SOSY
PREFILLED_SYRINGE | INTRAVENOUS | Status: DC | PRN
  Administered 2018-10-06: 200 mg via INTRAVENOUS

## 2018-10-06 MED ORDER — CHLORHEXIDINE GLUCONATE 4 % EX LIQD: 60.0000 mL | Freq: Once | CUTANEOUS | Status: DC

## 2018-10-06 MED ORDER — DOCUSATE SODIUM 100 MG PO CAPS
100.0000 mg | ORAL_CAPSULE | Freq: Two times a day (BID) | ORAL | Status: DC
  Administered 2018-10-06 – 2018-10-07 (×2): 100 mg via ORAL
  Filled 2018-10-06 (×2): qty 1

## 2018-10-06 MED ORDER — ACETAMINOPHEN 325 MG PO TABS: 325.0000 mg | ORAL_TABLET | Freq: Four times a day (QID) | ORAL | Status: DC | PRN

## 2018-10-06 MED ORDER — FENTANYL CITRATE (PF) 100 MCG/2ML IJ SOLN
25.0000 ug | INTRAMUSCULAR | Status: DC | PRN
  Administered 2018-10-06: 25 ug via INTRAVENOUS
  Administered 2018-10-06 (×2): 50 ug via INTRAVENOUS

## 2018-10-06 MED ORDER — PROPOFOL 10 MG/ML IV BOLUS
INTRAVENOUS | Status: DC | PRN
  Administered 2018-10-06 (×2): 200 mg via INTRAVENOUS

## 2018-10-06 MED ORDER — LACTATED RINGERS IV SOLN
INTRAVENOUS | Status: DC
  Administered 2018-10-06 (×2): via INTRAVENOUS

## 2018-10-06 MED ORDER — PHENYLEPHRINE 40 MCG/ML (10ML) SYRINGE FOR IV PUSH (FOR BLOOD PRESSURE SUPPORT)
PREFILLED_SYRINGE | INTRAVENOUS | Status: DC | PRN
  Administered 2018-10-06: 200 ug via INTRAVENOUS

## 2018-10-06 MED ORDER — FAMOTIDINE 20 MG PO TABS
10.0000 mg | ORAL_TABLET | Freq: Two times a day (BID) | ORAL | Status: DC
  Administered 2018-10-07: 10 mg via ORAL
  Filled 2018-10-06 (×2): qty 1

## 2018-10-06 MED ORDER — CEFAZOLIN SODIUM-DEXTROSE 2-4 GM/100ML-% IV SOLN
2.0000 g | INTRAVENOUS | Status: AC
  Administered 2018-10-06: 2 g via INTRAVENOUS
  Filled 2018-10-06: qty 100

## 2018-10-06 MED ORDER — MORPHINE SULFATE (PF) 2 MG/ML IV SOLN: 0.5000 mg | INTRAVENOUS | Status: DC | PRN

## 2018-10-06 MED ORDER — METOCLOPRAMIDE HCL 5 MG/ML IJ SOLN: 5.0000 mg | Freq: Three times a day (TID) | INTRAMUSCULAR | Status: DC | PRN

## 2018-10-06 MED ORDER — SODIUM CHLORIDE 0.9 % IR SOLN
Status: DC | PRN
  Administered 2018-10-06: 3000 mL

## 2018-10-06 MED ORDER — LATANOPROST 0.005 % OP SOLN
1.0000 [drp] | Freq: Every day | OPHTHALMIC | Status: DC
  Administered 2018-10-06: 1 [drp] via OPHTHALMIC
  Filled 2018-10-06: qty 2.5

## 2018-10-06 MED ORDER — ONDANSETRON HCL 4 MG/2ML IJ SOLN
INTRAMUSCULAR | Status: DC | PRN
  Administered 2018-10-06: 4 mg via INTRAVENOUS

## 2018-10-06 MED ORDER — LIDOCAINE 2% (20 MG/ML) 5 ML SYRINGE
INTRAMUSCULAR | Status: DC | PRN
  Administered 2018-10-06: 100 mg via INTRAVENOUS

## 2018-10-06 MED ORDER — SENNA 8.6 MG PO TABS
1.0000 | ORAL_TABLET | Freq: Two times a day (BID) | ORAL | Status: DC
  Administered 2018-10-06 – 2018-10-07 (×2): 8.6 mg via ORAL
  Filled 2018-10-06 (×2): qty 1

## 2018-10-06 SURGICAL SUPPLY — 49 items
BAG ZIPLOCK 12X15 (MISCELLANEOUS) ×3 IMPLANT
BLADE SURG SZ10 CARB STEEL (BLADE) ×3 IMPLANT
COVER SURGICAL LIGHT HANDLE (MISCELLANEOUS) ×3 IMPLANT
COVER WAND RF STERILE (DRAPES) ×3 IMPLANT
DERMABOND ADVANCED (GAUZE/BANDAGES/DRESSINGS)
DERMABOND ADVANCED .7 DNX12 (GAUZE/BANDAGES/DRESSINGS) IMPLANT
DRAPE ORTHO SPLIT 77X108 STRL (DRAPES) ×4
DRAPE SURG 17X11 SM STRL (DRAPES) ×3 IMPLANT
DRAPE SURG ORHT 6 SPLT 77X108 (DRAPES) ×2 IMPLANT
DRAPE U-SHAPE 47X51 STRL (DRAPES) ×3 IMPLANT
DRESSING PREVENA PLUS CUSTOM (GAUZE/BANDAGES/DRESSINGS) ×1 IMPLANT
DRSG AQUACEL AG ADV 3.5X10 (GAUZE/BANDAGES/DRESSINGS) IMPLANT
DRSG PREVENA PLUS CUSTOM (GAUZE/BANDAGES/DRESSINGS) ×3
DURAPREP 26ML APPLICATOR (WOUND CARE) IMPLANT
ELECT REM PT RETURN 15FT ADLT (MISCELLANEOUS) ×3 IMPLANT
EVACUATOR 1/8 PVC DRAIN (DRAIN) ×3 IMPLANT
GAUZE SPONGE 2X2 8PLY STRL LF (GAUZE/BANDAGES/DRESSINGS) ×1 IMPLANT
GLOVE BIOGEL PI IND STRL 7.5 (GLOVE) ×1 IMPLANT
GLOVE BIOGEL PI IND STRL 8.5 (GLOVE) ×1 IMPLANT
GLOVE BIOGEL PI INDICATOR 7.5 (GLOVE) ×2
GLOVE BIOGEL PI INDICATOR 8.5 (GLOVE) ×2
GLOVE ECLIPSE 8.0 STRL XLNG CF (GLOVE) IMPLANT
GLOVE ORTHO TXT STRL SZ7.5 (GLOVE) ×6 IMPLANT
GLOVE SURG ORTHO 8.0 STRL STRW (GLOVE) ×3 IMPLANT
GOWN SPEC L3 XXLG W/TWL (GOWN DISPOSABLE) ×6 IMPLANT
GOWN STRL REUS W/TWL LRG LVL3 (GOWN DISPOSABLE) ×3 IMPLANT
HANDPIECE INTERPULSE COAX TIP (DISPOSABLE) ×2
IV NS IRRIG 3000ML ARTHROMATIC (IV SOLUTION) ×3 IMPLANT
KIT BASIN OR (CUSTOM PROCEDURE TRAY) ×3 IMPLANT
KIT DRSG PREVENA PLUS 7DAY 125 (MISCELLANEOUS) ×3 IMPLANT
MANIFOLD NEPTUNE II (INSTRUMENTS) ×3 IMPLANT
PACK TOTAL JOINT (CUSTOM PROCEDURE TRAY) ×3 IMPLANT
PROTECTOR NERVE ULNAR (MISCELLANEOUS) IMPLANT
SET HNDPC FAN SPRY TIP SCT (DISPOSABLE) ×1 IMPLANT
SPONGE GAUZE 2X2 STER 10/PKG (GAUZE/BANDAGES/DRESSINGS) ×2
STAPLER VISISTAT 35W (STAPLE) IMPLANT
SUT ETHILON 2 0 PSLX (SUTURE) ×9 IMPLANT
SUT ETHILON 3 0 PS 1 (SUTURE) ×3 IMPLANT
SUT MNCRL AB 4-0 PS2 18 (SUTURE) IMPLANT
SUT MON AB 2-0 CT1 36 (SUTURE) ×6 IMPLANT
SUT PDS AB 1 CT1 27 (SUTURE) ×3 IMPLANT
SUT PDS AB 2-0 CT2 27 (SUTURE) ×6 IMPLANT
SUT VIC AB 1 CT1 36 (SUTURE) IMPLANT
SUT VIC AB 2-0 CT1 27 (SUTURE)
SUT VIC AB 2-0 CT1 TAPERPNT 27 (SUTURE) IMPLANT
SWAB COLLECTION DEVICE MRSA (MISCELLANEOUS) ×3 IMPLANT
SWAB CULTURE ESWAB REG 1ML (MISCELLANEOUS) ×3 IMPLANT
TOWEL OR 17X26 10 PK STRL BLUE (TOWEL DISPOSABLE) ×6 IMPLANT
WND VAC CANISTER 500ML (MISCELLANEOUS) ×3 IMPLANT

## 2018-10-06 NOTE — Anesthesia Preprocedure Evaluation (Addendum)
Anesthesia Evaluation  Patient identified by MRN, date of birth, ID band Patient awake    Reviewed: Allergy & Precautions, NPO status , Patient's Chart, lab work & pertinent test results  Airway Mallampati: I  TM Distance: >3 FB Neck ROM: Full    Dental  (+) Edentulous Upper, Edentulous Lower   Pulmonary neg pulmonary ROS, former smoker,    Pulmonary exam normal breath sounds clear to auscultation       Cardiovascular hypertension, negative cardio ROS Normal cardiovascular exam Rhythm:Regular Rate:Normal     Neuro/Psych PSYCHIATRIC DISORDERS Anxiety negative neurological ROS     GI/Hepatic Neg liver ROS, GERD  Medicated,  Endo/Other  negative endocrine ROSMorbid obesity  Renal/GU negative Renal ROS  negative genitourinary   Musculoskeletal  (+) Arthritis , Osteoarthritis,    Abdominal   Peds  Hematology  (+) Blood dyscrasia, anemia ,   Anesthesia Other Findings Wound dehiscence right hip   Reproductive/Obstetrics                            Anesthesia Physical Anesthesia Plan  ASA: III  Anesthesia Plan: General   Post-op Pain Management:    Induction: Intravenous  PONV Risk Score and Plan: 3 and Ondansetron, Dexamethasone and Treatment may vary due to age or medical condition  Airway Management Planned: Oral ETT and LMA  Additional Equipment:   Intra-op Plan:   Post-operative Plan: Extubation in OR  Informed Consent: I have reviewed the patients History and Physical, chart, labs and discussed the procedure including the risks, benefits and alternatives for the proposed anesthesia with the patient or authorized representative who has indicated his/her understanding and acceptance.   Dental advisory given  Plan Discussed with: CRNA  Anesthesia Plan Comments:         Anesthesia Quick Evaluation

## 2018-10-06 NOTE — Discharge Instructions (Signed)
Keep dressing clean and dry. Do not remove. Charge VAC unit nightly. Empty, record, and charge suction bulb every 8 hours.

## 2018-10-06 NOTE — Transfer of Care (Signed)
Immediate Anesthesia Transfer of Care Note  Patient: [name] [name]  Procedure(s) Performed: DEBRIDEMENT AND CLOSURE RIGHT HIP (Right Hip) APPLICATION OF WOUND VAC (Right Hip)  Patient Location: PACU  Anesthesia Type:General  Level of Consciousness: awake and alert   Airway & Oxygen Therapy: Patient Spontanous Breathing and Patient connected to face mask oxygen  Post-op Assessment: Report given to RN and Post -op Vital signs reviewed and stable  Post vital signs: Reviewed and stable  Last Vitals:  Vitals Value Taken Time  BP 146/117 10/06/2018  7:31 PM  Temp    Pulse 80 10/06/2018  7:33 PM  Resp 17 10/06/2018  7:33 PM  SpO2 95 % 10/06/2018  7:33 PM  Vitals shown include unvalidated device data.  Last Pain:  Vitals:   10/06/18 1528  TempSrc:   PainSc: 0-No pain         Complications: No apparent anesthesia complications

## 2018-10-06 NOTE — Op Note (Signed)
OPERATIVE REPORT   10/06/2018  7:17 PM  PATIENT:  [name] [name]   SURGEON:  Bertram Savin, MD  ASSISTANT:  Staff.   PREOPERATIVE DIAGNOSIS:  Superficial dehiscence of right hip wound.  POSTOPERATIVE DIAGNOSIS:  Same.  PROCEDURE:  1.  Excisional debridement of skin and subcutaneous tissue right hip. 2. Application of incisional wound VAC.  ANESTHESIA:   GETA.  ANTIBIOTICS: 2 g Ancef.  IMPLANTS: None.  TUBES AND DRAINS: 1.  JP drain x1. 2.  Incisional wound VAC at 125 mmHg.  SPECIMENS: Right hip superficial wound culture.  COMPLICATIONS: None.  DISPOSITION: Stable to PACU.  SURGICAL INDICATIONS:  [name] [name] is a 77 y.o. female who underwent primary right total hip arthroplasty about 3 weeks ago.  She developed necrosis of her skin edges and was indicated for operative debridement and closure.  The risks, benefits, and alternatives were discussed with the patient preoperatively including but not limited to the risks of infection, bleeding, nerve / blood vessel injury, cardiopulmonary complications, the need for repeat surgery, among others, and the patient was willing to proceed.  PROCEDURE IN DETAIL: Identified the patient in the holding areas and 2 identifiers.  The surgical site was marked by myself.  She was taken the operating room, and general anesthesia was induced.  She was then transferred to the [facility] table.  The right hip was prepped and draped in the normal sterile surgical fashion.  Timeout was called, verifying site and site of surgery.  I began by examining her right hip wound.  She had a few areas involving the superior two thirds of the incision where the skin edges had necrosis.  There is no fluctuance.  There is no periwound erythema.  I used a #10 blade to excisionally debride the edges of the wound, running for the entirety of the previous incision.  Underneath, there was a small amount of necrotic fat, which I excisionally debrided with a  rongeur.  I removed all suture material.  Deep to this area, the tissue was intact.  I elected to carefully dissected with Bovie electrocautery until I reached the underlying fascia.  The fascial sutures were intact.  I obtained a superficial wound swab and this was sent for culture.  There were no fluid collections.  There was no pus.  No evidence of infection.  After I was satisfied with the excisional debridement, I copiously irrigated the wound with 3 L of normal saline using pulse lavage.  I closed the deep fatty layer with #1 PDS suture over a 10 mm JP drain which I brought out distally in line with the incision through a separate stab incision.  Deep dermal layer was closed with 2-0 Monocryl suture.  Skin was closed with 2-0 nylon using a vertical mattress technique.  The drain was sewn in with 3-0 nylon.  I then applied a Prevena incisional wound VAC according to manufacturer's instructions.  VAC was hooked up to suction at 125 mmHg with excellent seal.  Dressing was placed over the JP drain site.  The patient was then extubated, taken to the PACU in stable condition.  Sponge, needle, and instrument counts were correct at the end of the case x2.  There were no known complications.  POSTOPERATIVE PLAN:  Postoperatively, the patient will be admitted for overnight observation.  She may weight-bear as tolerated right lower extremity.  24 hours of IV Ancef.  She will continue her aspirin for DVT prophylaxis. Follow culture.  Monitor JP output.  Plan for discharge tomorrow.  Convert to portable Prevena VAC unit upon discharge.  She will return to the office on 10/14/2018 for VAC removal.

## 2018-10-06 NOTE — H&P (Signed)
PREOPERATIVE H&P  Chief Complaint: Superficial dehiscence of right hip  HPI: [name] [name] is a 77 y.o. female who presents for preoperative history and physical with a diagnosis of Superficial dehiscence of right hip. She has necrosis of the skin edges. She has elected for surgical management.   Past Medical History:  Diagnosis Date  . Anemia   . Anxiety   . Arthritis    back, shoulders ,  right hip  . Breast cancer, left [facility]) oncologist-- dr Audelia Hives [facility] in [place])--- per lov in epic no recurrence   dx 05/ 2011---- Stage I (T1,N0M0), DCIS, ER+;  s/p  left breast lumpectomy ,  completed radiation therapy 06-02-2010,  started antiestrogen  . Full dentures   . GERD (gastroesophageal reflux disease)   . History of colon polyps 2009   Dr. Collene Mares  . History of external beam radiation therapy 04-21-2010  to 06-02-2010   left breast cancer  . Hyperparathyroidism ([place])    per pt dx 03/ 2019,  was told by surgeon not high enough to worry about  . Hypertension   . Vitamin D deficiency   . Wears glasses    Past Surgical History:  Procedure Laterality Date  . BREAST LUMPECTOMY Left 2011  . COLONOSCOPY N/A 05/18/2013   Procedure: COLONOSCOPY;  Surgeon: Daneil Dolin, MD;  Location[facility];  Service: Endoscopy;  Laterality: N/A;  9:30  . DILATATION & CURETTAGE/HYSTEROSCOPY WITH MYOSURE N/A 05/28/2016   Procedure: DILATATION & CURETTAGE/HYSTEROSCOPY WITH MYOSURE;  Surgeon: Servando Salina, MD;  Location: [facility];  Service: Gynecology;  Laterality: N/A;  . TOTAL HIP ARTHROPLASTY Right 09/15/2018   Procedure: TOTAL HIP ARTHROPLASTY ANTERIOR APPROACH;  Surgeon: Rod Can, MD;  Location: [facility];  Service: Orthopedics;  Laterality: Right;  . TOTAL KNEE ARTHROPLASTY Bilateral left 05-07-2009;  right 02-07-2010   both by dr Theda Sers @[facility]    Social History   Socioeconomic History  . Marital status: Legally Separated    Spouse name: Not on file  . Number of children: 4   . Years of education: Not on file  . Highest education level: Not on file  Occupational History  . Not on file  Social Needs  . Financial resource strain: Not on file  . Food insecurity:    Worry: Not on file    Inability: Not on file  . Transportation needs:    Medical: Not on file    Non-medical: Not on file  Tobacco Use  . Smoking status: Former Smoker    Years: 15.00    Types: Cigarettes    Last attempt to quit: 09/09/1974    Years since quitting: 44.1  . Smokeless tobacco: Never Used  Substance and Sexual Activity  . Alcohol use: No  . Drug use: Never  . Sexual activity: Not on file  Lifestyle  . Physical activity:    Days per week: Not on file    Minutes per session: Not on file  . Stress: Not on file  Relationships  . Social connections:    Talks on phone: Not on file    Gets together: Not on file    Attends religious service: Not on file    Active member of club or organization: Not on file    Attends meetings of clubs or organizations: Not on file    Relationship status: Not on file  Other Topics Concern  . Not on file  Social History Narrative  . Not on file   Family  History  Problem Relation Age of Onset  . Breast cancer Mother   . Colon cancer Neg Hx   . Lung cancer Neg Hx   . Ovarian cancer Neg Hx    Allergies  Allergen Reactions  . Sulfa Antibiotics Swelling and Rash   Prior to Admission medications   Medication Sig Start Date End Date Taking? Authorizing Provider  amLODipine (NORVASC) 5 MG tablet Take 10 mg by mouth at bedtime.   Yes [provider]  aspirin 81 MG chewable tablet Chew 1 tablet (81 mg total) by mouth 2 (two) times daily. 09/16/18  Yes Marili Vader, Aaron Edelman, MD  benazepril (LOTENSIN) 20 MG tablet Take 40 mg by mouth every morning.    Yes [provider]  cetirizine (ZYRTEC) 10 MG tablet Take 10 mg by mouth every morning.    Yes [provider]  docusate sodium (COLACE) 100 MG capsule Take 1 capsule (100 mg  total) by mouth 2 (two) times daily. 09/16/18  Yes Kaushik Maul, Aaron Edelman, MD  famotidine (PEPCID) 10 MG tablet Take 10 mg by mouth 2 (two) times daily.   Yes [provider]  latanoprost (XALATAN) 0.005 % ophthalmic solution Place 1 drop into both eyes at bedtime.   Yes [provider]  senna (SENOKOT) 8.6 MG TABS tablet Take 1 tablet (8.6 mg total) by mouth 2 (two) times daily. 09/16/18  Yes Earnie Bechard, Aaron Edelman, MD  HYDROcodone-acetaminophen (NORCO/VICODIN) 5-325 MG tablet Take 1-2 tablets by mouth every 6 (six) hours as needed for moderate pain. 09/16/18   Makail Watling, Aaron Edelman, MD  ondansetron (ZOFRAN) 4 MG tablet Take 1 tablet (4 mg total) by mouth every 6 (six) hours as needed for nausea. 09/16/18   Refugio Vandevoorde, Aaron Edelman, MD  ranitidine (ZANTAC) 150 MG tablet Take 150 mg by mouth daily as needed for heartburn.     [provider]     Positive ROS: All other systems have been reviewed and were otherwise negative with the exception of those mentioned in the HPI and as above.  Physical Exam: General: Alert, no acute distress Cardiovascular: No pedal edema Respiratory: No cyanosis, no use of accessory musculature GI: No organomegaly, abdomen is soft and non-tender Skin: No lesions in the area of chief complaint Neurologic: Sensation intact distally Psychiatric: Patient is competent for consent with normal mood and affect Lymphatic: No axillary or cervical lymphadenopathy  MUSCULOSKELETAL: R hip incis with necrotic skin edges. No erythema. No active drainage.  Assessment: Superficial dehiscence of right hip  Plan: Plan for Procedure(s): DEBRIDEMENT AND CLOSURE RIGHT HIP  The risks benefits and alternatives were discussed with the patient including but not limited to the risks of nonoperative treatment, versus surgical intervention including infection, bleeding, nerve injury,  blood clots, cardiopulmonary complications, morbidity, mortality, among others, and they were willing to  proceed.   Bertram Savin, MD Cell [phone]   10/06/2018 4:32 PM

## 2018-10-06 NOTE — Anesthesia Procedure Notes (Signed)
Procedure Name: Intubation Date/Time: 10/06/2018 5:47 PM Performed by: Breyana Follansbee D, CRNA Pre-anesthesia Checklist: Patient identified, Emergency Drugs available, Suction available and Patient being monitored Patient Re-evaluated:Patient Re-evaluated prior to induction Oxygen Delivery Method: Circle system utilized Preoxygenation: Pre-oxygenation with 100% oxygen Induction Type: IV induction Ventilation: Mask ventilation without difficulty Laryngoscope Size: Mac and 4 Grade View: Grade I Tube type: Oral Tube size: 7.5 mm Number of attempts: 1 Airway Equipment and Method: Stylet Placement Confirmation: ETT inserted through vocal cords under direct vision,  positive ETCO2 and breath sounds checked- equal and bilateral Secured at: 21 cm Tube secured with: Tape Dental Injury: Teeth and Oropharynx as per pre-operative assessment

## 2018-10-06 NOTE — Anesthesia Procedure Notes (Signed)
Procedure Name: LMA Insertion Date/Time: 10/06/2018 5:26 PM Performed by: Marquell Saenz D, CRNA Pre-anesthesia Checklist: Patient identified, Emergency Drugs available, Suction available and Patient being monitored Patient Re-evaluated:Patient Re-evaluated prior to induction Oxygen Delivery Method: Circle system utilized Preoxygenation: Pre-oxygenation with 100% oxygen Induction Type: IV induction Ventilation: Mask ventilation without difficulty LMA: LMA inserted LMA Size: 4.0 Tube type: Oral Number of attempts: 1 Placement Confirmation: positive ETCO2 and breath sounds checked- equal and bilateral Tube secured with: Tape Dental Injury: Teeth and Oropharynx as per pre-operative assessment

## 2018-10-06 NOTE — Anesthesia Procedure Notes (Signed)
Performed by: Jahziel Sinn M, CRNA Oxygen Delivery Method: Simple face mask Placement Confirmation: positive ETCO2 and breath sounds checked- equal and bilateral Dental Injury: Teeth and Oropharynx as per pre-operative assessment        

## 2018-10-07 ENCOUNTER — Other Ambulatory Visit: Payer: Self-pay

## 2018-10-07 ENCOUNTER — Encounter (HOSPITAL_COMMUNITY): Payer: Self-pay | Admitting: Orthopedic Surgery

## 2018-10-07 DIAGNOSIS — T8131XA Disruption of external operation (surgical) wound, not elsewhere classified, initial encounter: Secondary | ICD-10-CM | POA: Diagnosis not present

## 2018-10-07 NOTE — Progress Notes (Signed)
    Subjective:  Patient reports pain as mild to moderate.  Denies N/V/CP/SOB. No c/o.  Objective:   VITALS:   Vitals:   10/06/18 2206 10/06/18 2255 10/07/18 0000 10/07/18 0621  BP: (!) 165/62 (!) 138/47 (!) 140/52 (!) 127/51  Pulse: 82 72 74 68  Resp: 15 17 16 17   Temp: 98.3 F (36.8 C) 98.1 F (36.7 C) 97.6 F (36.4 C) 98.9 F (37.2 C)  TempSrc: Oral Oral Oral Oral  SpO2: 92% 100% 100% 99%  Weight:      Height:        NAD ABD soft Sensation intact distally Intact pulses distally Dorsiflexion/Plantar flexion intact Compartment soft Prevena intact JP intact  Lab Results  Component Value Date   WBC 6.2 10/06/2018   HGB 10.7 (L) 10/06/2018   HCT 37.9 10/06/2018   MCV 101.1 (H) 10/06/2018   PLT 346 10/06/2018   BMET    Component Value Date/Time   NA 138 10/06/2018 1610   K 4.3 10/06/2018 1610   CL 106 10/06/2018 1610   CO2 24 10/06/2018 1610   GLUCOSE 94 10/06/2018 1610   BUN 13 10/06/2018 1610   BUN 24 (A) 03/17/2013   CREATININE 0.81 10/06/2018 1610   CREATININE 1.03 03/17/2013   CALCIUM 10.7 (H) 10/06/2018 1610   GFRNONAA >60 10/06/2018 1610   GFRAA >60 10/06/2018 1610   Recent Results (from the past 240 hour(s))  Aerobic/Anaerobic Culture (surgical/deep wound)     Status: None (Preliminary result)   Collection Time: 10/06/18  6:13 PM  Result Value Ref Range Status   Specimen Description   Final    WOUND RIGHT HIP Performed at [facility], [place] [address] [place], [place] [address]    Special Requests   Final    NONE Performed at [facility], [place] [address], [place], [place] [address]    Gram Stain   Final    RARE WBC PRESENT, PREDOMINANTLY MONONUCLEAR NO ORGANISMS SEEN Performed at [facility], [place] [address], [place], [place] [address]    Culture PENDING  Incomplete   Report Status PENDING  Incomplete     Assessment/Plan: 1 Day Post-Op   Active Problems:   Surgical wound  dehiscence   WBAT with walker DVT ppx: Aspirin, SCDs, TEDS PO pain control PT/OT Dispo: D/C home with iVAC and JP, covert VAC unit to portable Prevena upon d/c   Bertram Savin 10/07/2018, 10:19 AM   Rod Can, MD Cell: [phone] [facility] now [facility]  [place] [address] [place], [place], [place] [address] Phone: [phone] [email or web address] Facebook  [employer]

## 2018-10-07 NOTE — Progress Notes (Signed)
Patient discharged to home w/ dtr. Given all belongings, instructions, equipment. Wound vac changed to prevena pump and cannister, educated patient and daughter about prevena pump, patient guide given. Drain dressing changed. Educated dressing changes for home to patient. All questions answered. Verbalized understanding. Escorted to pov via w/c.

## 2018-10-07 NOTE — Discharge Summary (Signed)
Physician Discharge Summary  Patient ID: [name] [name] MRN: [id number] DOB/AGE: 05/07/1941 77 y.o.  Admit date: 10/06/2018 Discharge date: 10/07/2018  Admission Diagnoses:  Surgical wound dehiscence  Discharge Diagnoses:  Principal Problem:   Surgical wound dehiscence   Past Medical History:  Diagnosis Date  . Anemia   . Anxiety   . Arthritis    back, shoulders ,  right hip  . 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 Full dentures   . GERD (gastroesophageal reflux disease)   . History of colon polyps 2009   Dr. Collene Mares  . History of external beam radiation therapy 04-21-2010  to 06-02-2010   left breast cancer  . Hyperparathyroidism ([place])    per pt dx 03/ 2019,  was told by surgeon not high enough to worry about  . Hypertension   . Vitamin D deficiency   . Wears glasses     Surgeries: Procedure(s): DEBRIDEMENT AND CLOSURE RIGHT HIP APPLICATION OF WOUND VAC on 10/06/2018   Consultants (if any):   Discharged Condition: Improved  Hospital Course: [name] [name] is an 77 y.o. female who was admitted 10/06/2018 with a diagnosis of Surgical wound dehiscence and went to the operating room on 10/06/2018 and underwent the above named procedures.    She was given perioperative antibiotics:  Anti-infectives (From admission, onward)   Start     Dose/Rate Route Frequency Ordered Stop   10/06/18 2330  ceFAZolin (ANCEF) IVPB 2g/100 mL premix     2 g 200 mL/hr over 30 Minutes Intravenous Every 6 hours 10/06/18 2101 10/07/18 1301   10/06/18 1757  polymyxin B 500,000 Units, bacitracin 50,000 Units in sodium chloride 0.9 % 500 mL irrigation  Status:  Discontinued       As needed 10/06/18 1757 10/06/18 1929   10/06/18 1515  ceFAZolin (ANCEF) IVPB 2g/100 mL premix     2  g 200 mL/hr over 30 Minutes Intravenous On call to O.R. 10/06/18 1455 10/06/18 1728    .  She was given sequential compression devices, early ambulation, and ASA for DVT prophylaxis.  WBAT with walker. Continue JP and iVAC.  She benefited maximally from the hospital stay and there were no complications.    Recent vital signs:  Vitals:   10/07/18 0621 10/07/18 1138  BP: (!) 127/51 (!) 123/48  Pulse: 68 64  Resp: 17 15  Temp: 98.9 F (37.2 C) 98.4 F (36.9 C)  SpO2: 99% 97%    Recent laboratory studies:  Lab Results  Component Value Date   HGB 10.7 (L) 10/06/2018   HGB 10.2 (L) 09/16/2018   HGB 11.7 (L) 09/09/2018   Lab Results  Component Value Date   WBC 6.2 10/06/2018   PLT 346 10/06/2018   Lab Results  Component Value Date   INR 0.98 02/03/2010   Lab Results  Component Value Date   NA 138 10/06/2018   K 4.3 10/06/2018   CL 106 10/06/2018   CO2 24 10/06/2018   BUN 13 10/06/2018   CREATININE 0.81 10/06/2018   GLUCOSE 94 10/06/2018    Discharge Medications:   Allergies as of 10/07/2018      Reactions   Sulfa Antibiotics Swelling, Rash      Medication List    TAKE these medications  amLODipine 5 MG tablet Commonly known as:  NORVASC Take 10 mg by mouth at bedtime.   aspirin 81 MG chewable tablet Chew 1 tablet (81 mg total) by mouth 2 (two) times daily.   benazepril 20 MG tablet Commonly known as:  LOTENSIN Take 40 mg by mouth every morning.   cetirizine 10 MG tablet Commonly known as:  ZYRTEC Take 10 mg by mouth every morning.   docusate sodium 100 MG capsule Commonly known as:  COLACE Take 1 capsule (100 mg total) by mouth 2 (two) times daily.   famotidine 10 MG tablet Commonly known as:  PEPCID Take 10 mg by mouth 2 (two) times daily.   HYDROcodone-acetaminophen 5-325 MG tablet Commonly known as:  NORCO/VICODIN Take 1-2 tablets by mouth every 6 (six) hours as needed for moderate pain.   latanoprost 0.005 % ophthalmic  solution Commonly known as:  XALATAN Place 1 drop into both eyes at bedtime.   ondansetron 4 MG tablet Commonly known as:  ZOFRAN Take 1 tablet (4 mg total) by mouth every 6 (six) hours as needed for nausea.   ranitidine 150 MG tablet Commonly known as:  ZANTAC Take 150 mg by mouth daily as needed for heartburn.   senna 8.6 MG Tabs tablet Commonly known as:  SENOKOT Take 1 tablet (8.6 mg total) by mouth 2 (two) times daily.       Diagnostic Studies: Dg Pelvis Portable  Result Date: 09/15/2018 CLINICAL DATA:  Total right hip replacement. EXAM: PORTABLE PELVIS 1-2 VIEWS COMPARISON:  09/15/2018. FINDINGS: Total right hip replacement. Anatomic alignment. Hardware intact. No acute bony abnormality. IMPRESSION: Total right hip replacement with anatomic alignment. Electronically Signed   By: Marcello Moores  Register   On: 09/15/2018 10:51   Dg C-arm 1-60 Min-no Report  Result Date: 09/15/2018 Fluoroscopy was utilized by the requesting physician.  No radiographic interpretation.   Dg Hip Operative Unilat W Or W/o Pelvis Right  Result Date: 09/15/2018 CLINICAL DATA:  Right hip replacement EXAM: OPERATIVE RIGHT HIP (WITH PELVIS IF PERFORMED) 3 VIEWS TECHNIQUE: Fluoroscopic spot image(s) were submitted for interpretation post-operatively. COMPARISON:  None. FINDINGS: Multiple intraoperative spot images demonstrate changes of right hip replacement. Normal AP alignment. No visible hardware or bony complicating feature. IMPRESSION: Right hip replacement.  No visible complicating feature. Electronically Signed   By: Rolm Baptise M.D.   On: 09/15/2018 09:53    Disposition:    Discharge Instructions    Call MD / Call 911   Complete by:  As directed    If you experience chest pain or shortness of breath, CALL 911 and be transported to the hospital emergency room.  If you develope a fever above 101 F, pus (white drainage) or increased drainage or redness at the wound, or calf pain, call your surgeon's  office.   Constipation Prevention   Complete by:  As directed    Drink plenty of fluids.  Prune juice may be helpful.  You may use a stool softener, such as Colace (over the counter) 100 mg twice a day.  Use MiraLax (over the counter) for constipation as needed.   Diet - low sodium heart healthy   Complete by:  As directed    Discharge instructions   Complete by:  As directed    Keep VAC dressing clean and dry. Charge VAC unit nightly. Every 8 hours, empty JP bulb, record drainage, and recharge. Resume doxycycline.   Increase activity slowly as tolerated   Complete by:  As directed  Follow-up Information    Nikia Mangino, Aaron Edelman, MD. Schedule an appointment as soon as possible for a visit on 10/14/2018.   Specialty:  Orthopedic Surgery Why:  [facility] removal Contact information: [address] [place] [address] [phone]            Signed: Hilton Cork Tamara Kenyon 10/08/2018, 7:35 PM

## 2018-10-12 LAB — AEROBIC/ANAEROBIC CULTURE W GRAM STAIN (SURGICAL/DEEP WOUND): Culture: NO GROWTH

## 2018-10-15 NOTE — Anesthesia Postprocedure Evaluation (Signed)
Anesthesia Post Note  Patient: [name]  Procedure(s) Performed: DEBRIDEMENT AND CLOSURE RIGHT HIP (Right Hip) APPLICATION OF WOUND VAC (Right Hip)     Patient location during evaluation: PACU Anesthesia Type: General Level of consciousness: awake and alert Pain management: pain level controlled Vital Signs Assessment: post-procedure vital signs reviewed and stable Respiratory status: spontaneous breathing, nonlabored ventilation, respiratory function stable and patient connected to nasal cannula oxygen Cardiovascular status: blood pressure returned to baseline and stable Postop Assessment: no apparent nausea or vomiting Anesthetic complications: no    Last Vitals:  Vitals:   10/07/18 0621 10/07/18 1138  BP: (!) 127/51 (!) 123/48  Pulse: 68 64  Resp: 17 15  Temp: 37.2 C 36.9 C  SpO2: 99% 97%    Last Pain:  Vitals:   10/07/18 1138  TempSrc: Oral  PainSc:                  Kadeshia Kasparian L Nyonna Hargrove

## 2019-12-12 DIAGNOSIS — M159 Polyosteoarthritis, unspecified: Secondary | ICD-10-CM | POA: Diagnosis not present

## 2019-12-12 DIAGNOSIS — I10 Essential (primary) hypertension: Secondary | ICD-10-CM | POA: Diagnosis not present

## 2019-12-21 DIAGNOSIS — Z299 Encounter for prophylactic measures, unspecified: Secondary | ICD-10-CM | POA: Diagnosis not present

## 2019-12-21 DIAGNOSIS — Z789 Other specified health status: Secondary | ICD-10-CM | POA: Diagnosis not present

## 2019-12-21 DIAGNOSIS — E210 Primary hyperparathyroidism: Secondary | ICD-10-CM | POA: Diagnosis not present

## 2019-12-21 DIAGNOSIS — I10 Essential (primary) hypertension: Secondary | ICD-10-CM | POA: Diagnosis not present

## 2019-12-26 DIAGNOSIS — Z79811 Long term (current) use of aromatase inhibitors: Secondary | ICD-10-CM | POA: Diagnosis not present

## 2019-12-26 DIAGNOSIS — Z853 Personal history of malignant neoplasm of breast: Secondary | ICD-10-CM | POA: Diagnosis not present

## 2019-12-26 DIAGNOSIS — E559 Vitamin D deficiency, unspecified: Secondary | ICD-10-CM | POA: Diagnosis not present

## 2020-01-01 DIAGNOSIS — C50919 Malignant neoplasm of unspecified site of unspecified female breast: Secondary | ICD-10-CM | POA: Diagnosis not present

## 2020-01-01 DIAGNOSIS — T464X5D Adverse effect of angiotensin-converting-enzyme inhibitors, subsequent encounter: Secondary | ICD-10-CM | POA: Diagnosis not present

## 2020-01-01 DIAGNOSIS — M8580 Other specified disorders of bone density and structure, unspecified site: Secondary | ICD-10-CM | POA: Diagnosis not present

## 2020-01-01 DIAGNOSIS — T783XXD Angioneurotic edema, subsequent encounter: Secondary | ICD-10-CM | POA: Diagnosis not present

## 2020-01-01 DIAGNOSIS — E559 Vitamin D deficiency, unspecified: Secondary | ICD-10-CM | POA: Diagnosis not present

## 2020-01-04 DIAGNOSIS — H2513 Age-related nuclear cataract, bilateral: Secondary | ICD-10-CM | POA: Diagnosis not present

## 2020-01-04 DIAGNOSIS — H401431 Capsular glaucoma with pseudoexfoliation of lens, bilateral, mild stage: Secondary | ICD-10-CM | POA: Diagnosis not present

## 2020-01-04 DIAGNOSIS — H40053 Ocular hypertension, bilateral: Secondary | ICD-10-CM | POA: Diagnosis not present

## 2020-01-04 DIAGNOSIS — H18413 Arcus senilis, bilateral: Secondary | ICD-10-CM | POA: Diagnosis not present

## 2020-01-04 DIAGNOSIS — H40023 Open angle with borderline findings, high risk, bilateral: Secondary | ICD-10-CM | POA: Diagnosis not present

## 2020-01-08 DIAGNOSIS — Z7189 Other specified counseling: Secondary | ICD-10-CM | POA: Diagnosis not present

## 2020-01-08 DIAGNOSIS — I517 Cardiomegaly: Secondary | ICD-10-CM | POA: Diagnosis not present

## 2020-01-08 DIAGNOSIS — R0602 Shortness of breath: Secondary | ICD-10-CM | POA: Diagnosis not present

## 2020-01-08 DIAGNOSIS — Z299 Encounter for prophylactic measures, unspecified: Secondary | ICD-10-CM | POA: Diagnosis not present

## 2020-01-08 DIAGNOSIS — I10 Essential (primary) hypertension: Secondary | ICD-10-CM | POA: Diagnosis not present

## 2020-01-08 DIAGNOSIS — I700 Atherosclerosis of aorta: Secondary | ICD-10-CM | POA: Diagnosis not present

## 2020-01-08 DIAGNOSIS — Z0000 Encounter for general adult medical examination without abnormal findings: Secondary | ICD-10-CM | POA: Diagnosis not present

## 2020-01-08 DIAGNOSIS — Z1211 Encounter for screening for malignant neoplasm of colon: Secondary | ICD-10-CM | POA: Diagnosis not present

## 2020-01-15 DIAGNOSIS — E7800 Pure hypercholesterolemia, unspecified: Secondary | ICD-10-CM | POA: Diagnosis not present

## 2020-01-15 DIAGNOSIS — Z79899 Other long term (current) drug therapy: Secondary | ICD-10-CM | POA: Diagnosis not present

## 2020-01-15 DIAGNOSIS — E559 Vitamin D deficiency, unspecified: Secondary | ICD-10-CM | POA: Diagnosis not present

## 2020-01-15 DIAGNOSIS — R5383 Other fatigue: Secondary | ICD-10-CM | POA: Diagnosis not present

## 2020-02-07 DIAGNOSIS — I10 Essential (primary) hypertension: Secondary | ICD-10-CM | POA: Diagnosis not present

## 2020-02-07 DIAGNOSIS — M159 Polyosteoarthritis, unspecified: Secondary | ICD-10-CM | POA: Diagnosis not present

## 2020-03-11 DIAGNOSIS — Z299 Encounter for prophylactic measures, unspecified: Secondary | ICD-10-CM | POA: Diagnosis not present

## 2020-03-11 DIAGNOSIS — I10 Essential (primary) hypertension: Secondary | ICD-10-CM | POA: Diagnosis not present

## 2020-03-11 DIAGNOSIS — I700 Atherosclerosis of aorta: Secondary | ICD-10-CM | POA: Diagnosis not present

## 2020-03-14 DIAGNOSIS — Z1231 Encounter for screening mammogram for malignant neoplasm of breast: Secondary | ICD-10-CM | POA: Diagnosis not present

## 2020-03-20 DIAGNOSIS — H1045 Other chronic allergic conjunctivitis: Secondary | ICD-10-CM | POA: Diagnosis not present

## 2020-03-24 DIAGNOSIS — I10 Essential (primary) hypertension: Secondary | ICD-10-CM | POA: Diagnosis not present

## 2020-03-24 DIAGNOSIS — M159 Polyosteoarthritis, unspecified: Secondary | ICD-10-CM | POA: Diagnosis not present

## 2020-03-27 DIAGNOSIS — Z853 Personal history of malignant neoplasm of breast: Secondary | ICD-10-CM | POA: Diagnosis not present

## 2020-03-27 DIAGNOSIS — N6321 Unspecified lump in the left breast, upper outer quadrant: Secondary | ICD-10-CM | POA: Diagnosis not present

## 2020-03-27 DIAGNOSIS — R928 Other abnormal and inconclusive findings on diagnostic imaging of breast: Secondary | ICD-10-CM | POA: Diagnosis not present

## 2020-04-03 DIAGNOSIS — N649 Disorder of breast, unspecified: Secondary | ICD-10-CM | POA: Diagnosis not present

## 2020-04-03 DIAGNOSIS — N6321 Unspecified lump in the left breast, upper outer quadrant: Secondary | ICD-10-CM | POA: Diagnosis not present

## 2020-04-10 DIAGNOSIS — T783XXD Angioneurotic edema, subsequent encounter: Secondary | ICD-10-CM | POA: Diagnosis not present

## 2020-04-10 DIAGNOSIS — E559 Vitamin D deficiency, unspecified: Secondary | ICD-10-CM | POA: Diagnosis not present

## 2020-04-10 DIAGNOSIS — Z853 Personal history of malignant neoplasm of breast: Secondary | ICD-10-CM | POA: Diagnosis not present

## 2020-04-10 DIAGNOSIS — C50912 Malignant neoplasm of unspecified site of left female breast: Secondary | ICD-10-CM | POA: Diagnosis not present

## 2020-04-10 DIAGNOSIS — C50919 Malignant neoplasm of unspecified site of unspecified female breast: Secondary | ICD-10-CM | POA: Diagnosis not present

## 2020-04-16 DIAGNOSIS — C50912 Malignant neoplasm of unspecified site of left female breast: Secondary | ICD-10-CM | POA: Diagnosis not present

## 2020-04-17 DIAGNOSIS — C50912 Malignant neoplasm of unspecified site of left female breast: Secondary | ICD-10-CM | POA: Diagnosis not present

## 2020-04-17 DIAGNOSIS — Z1231 Encounter for screening mammogram for malignant neoplasm of breast: Secondary | ICD-10-CM | POA: Diagnosis not present

## 2020-04-24 DIAGNOSIS — Z170 Estrogen receptor positive status [ER+]: Secondary | ICD-10-CM | POA: Diagnosis not present

## 2020-04-24 DIAGNOSIS — C50912 Malignant neoplasm of unspecified site of left female breast: Secondary | ICD-10-CM | POA: Diagnosis not present

## 2020-04-24 DIAGNOSIS — C50919 Malignant neoplasm of unspecified site of unspecified female breast: Secondary | ICD-10-CM | POA: Diagnosis not present

## 2020-04-25 DIAGNOSIS — C50912 Malignant neoplasm of unspecified site of left female breast: Secondary | ICD-10-CM | POA: Diagnosis not present

## 2020-04-25 DIAGNOSIS — Z170 Estrogen receptor positive status [ER+]: Secondary | ICD-10-CM | POA: Diagnosis not present

## 2020-05-02 DIAGNOSIS — M1990 Unspecified osteoarthritis, unspecified site: Secondary | ICD-10-CM | POA: Diagnosis not present

## 2020-05-02 DIAGNOSIS — C50412 Malignant neoplasm of upper-outer quadrant of left female breast: Secondary | ICD-10-CM | POA: Diagnosis not present

## 2020-05-02 DIAGNOSIS — Z87891 Personal history of nicotine dependence: Secondary | ICD-10-CM | POA: Diagnosis not present

## 2020-05-02 DIAGNOSIS — K219 Gastro-esophageal reflux disease without esophagitis: Secondary | ICD-10-CM | POA: Diagnosis not present

## 2020-05-02 DIAGNOSIS — Z923 Personal history of irradiation: Secondary | ICD-10-CM | POA: Diagnosis not present

## 2020-05-02 DIAGNOSIS — D0512 Intraductal carcinoma in situ of left breast: Secondary | ICD-10-CM | POA: Diagnosis not present

## 2020-05-02 DIAGNOSIS — Z96653 Presence of artificial knee joint, bilateral: Secondary | ICD-10-CM | POA: Diagnosis not present

## 2020-05-02 DIAGNOSIS — Z780 Asymptomatic menopausal state: Secondary | ICD-10-CM | POA: Diagnosis not present

## 2020-05-02 DIAGNOSIS — Z170 Estrogen receptor positive status [ER+]: Secondary | ICD-10-CM | POA: Diagnosis not present

## 2020-05-02 DIAGNOSIS — Z96641 Presence of right artificial hip joint: Secondary | ICD-10-CM | POA: Diagnosis not present

## 2020-05-02 DIAGNOSIS — I10 Essential (primary) hypertension: Secondary | ICD-10-CM | POA: Diagnosis not present

## 2020-05-02 DIAGNOSIS — C50912 Malignant neoplasm of unspecified site of left female breast: Secondary | ICD-10-CM | POA: Diagnosis not present

## 2020-05-02 HISTORY — PX: BREAST LUMPECTOMY: SHX2

## 2020-05-10 DIAGNOSIS — C50912 Malignant neoplasm of unspecified site of left female breast: Secondary | ICD-10-CM | POA: Diagnosis not present

## 2020-05-10 DIAGNOSIS — Z170 Estrogen receptor positive status [ER+]: Secondary | ICD-10-CM | POA: Diagnosis not present

## 2020-05-13 DIAGNOSIS — C50312 Malignant neoplasm of lower-inner quadrant of left female breast: Secondary | ICD-10-CM | POA: Diagnosis not present

## 2020-05-13 DIAGNOSIS — H401431 Capsular glaucoma with pseudoexfoliation of lens, bilateral, mild stage: Secondary | ICD-10-CM | POA: Diagnosis not present

## 2020-05-13 DIAGNOSIS — H2513 Age-related nuclear cataract, bilateral: Secondary | ICD-10-CM | POA: Diagnosis not present

## 2020-05-13 DIAGNOSIS — Z170 Estrogen receptor positive status [ER+]: Secondary | ICD-10-CM | POA: Diagnosis not present

## 2020-05-13 DIAGNOSIS — H40053 Ocular hypertension, bilateral: Secondary | ICD-10-CM | POA: Diagnosis not present

## 2020-05-24 DIAGNOSIS — M159 Polyosteoarthritis, unspecified: Secondary | ICD-10-CM | POA: Diagnosis not present

## 2020-05-24 DIAGNOSIS — I10 Essential (primary) hypertension: Secondary | ICD-10-CM | POA: Diagnosis not present

## 2020-05-29 DIAGNOSIS — Z9889 Other specified postprocedural states: Secondary | ICD-10-CM | POA: Diagnosis not present

## 2020-05-29 DIAGNOSIS — K219 Gastro-esophageal reflux disease without esophagitis: Secondary | ICD-10-CM | POA: Diagnosis not present

## 2020-05-29 DIAGNOSIS — I10 Essential (primary) hypertension: Secondary | ICD-10-CM | POA: Diagnosis not present

## 2020-05-29 DIAGNOSIS — Z923 Personal history of irradiation: Secondary | ICD-10-CM | POA: Diagnosis not present

## 2020-05-29 DIAGNOSIS — C50912 Malignant neoplasm of unspecified site of left female breast: Secondary | ICD-10-CM | POA: Diagnosis not present

## 2020-05-29 DIAGNOSIS — Z79899 Other long term (current) drug therapy: Secondary | ICD-10-CM | POA: Diagnosis not present

## 2020-05-29 DIAGNOSIS — Z87891 Personal history of nicotine dependence: Secondary | ICD-10-CM | POA: Diagnosis not present

## 2020-05-29 DIAGNOSIS — Z170 Estrogen receptor positive status [ER+]: Secondary | ICD-10-CM | POA: Diagnosis not present

## 2020-05-31 DIAGNOSIS — Z923 Personal history of irradiation: Secondary | ICD-10-CM | POA: Diagnosis not present

## 2020-05-31 DIAGNOSIS — Z853 Personal history of malignant neoplasm of breast: Secondary | ICD-10-CM | POA: Diagnosis not present

## 2020-05-31 DIAGNOSIS — Z170 Estrogen receptor positive status [ER+]: Secondary | ICD-10-CM | POA: Diagnosis not present

## 2020-05-31 DIAGNOSIS — C50912 Malignant neoplasm of unspecified site of left female breast: Secondary | ICD-10-CM | POA: Diagnosis not present

## 2020-05-31 DIAGNOSIS — Z7189 Other specified counseling: Secondary | ICD-10-CM | POA: Diagnosis not present

## 2020-06-03 ENCOUNTER — Telehealth: Payer: Self-pay | Admitting: *Deleted

## 2020-06-03 NOTE — Telephone Encounter (Signed)
LVM for a call back to schedule appointment with Dr. Lisbeth Renshaw in Radiation Oncology.

## 2020-06-05 ENCOUNTER — Ambulatory Visit
Admission: RE | Admit: 2020-06-05 | Discharge: 2020-06-05 | Disposition: A | Discharge status: Home / Self Care | Payer: Medicare Other | Source: Ambulatory Visit | Attending: Radiation Oncology | Admitting: Radiation Oncology

## 2020-06-05 ENCOUNTER — Other Ambulatory Visit: Payer: Self-pay

## 2020-06-05 ENCOUNTER — Encounter: Payer: Self-pay | Admitting: Radiation Oncology

## 2020-06-05 VITALS — Ht 65.0 in | Wt 244.0 lb

## 2020-06-05 DIAGNOSIS — C50419 Malignant neoplasm of upper-outer quadrant of unspecified female breast: Secondary | ICD-10-CM | POA: Insufficient documentation

## 2020-06-05 DIAGNOSIS — Z170 Estrogen receptor positive status [ER+]: Secondary | ICD-10-CM | POA: Diagnosis not present

## 2020-06-05 DIAGNOSIS — C50412 Malignant neoplasm of upper-outer quadrant of left female breast: Secondary | ICD-10-CM

## 2020-06-05 DIAGNOSIS — Z803 Family history of malignant neoplasm of breast: Secondary | ICD-10-CM | POA: Diagnosis not present

## 2020-06-05 DIAGNOSIS — Z923 Personal history of irradiation: Secondary | ICD-10-CM | POA: Diagnosis not present

## 2020-06-05 DIAGNOSIS — Z853 Personal history of malignant neoplasm of breast: Secondary | ICD-10-CM | POA: Diagnosis not present

## 2020-06-05 NOTE — Progress Notes (Signed)
Location of Breast Cancer: Malignant neoplasm of Left breast- recurrence  Did patient present with symptoms (if so, please note symptoms) or was this found on screening mammography?: Routine Mammogram May 2021  Diagnostic Mammogram/ [place] 03/31/2020: 0.7 cm ovoid circumscribed lobulated mass in upper outer left breast by mammogram.  Ultrasound showed a 0.6 x 0.5 x 0.6 cm ovoid hypoechoic mass with slight margin irregularity at 2 o'clock position 3 cm from the nipple.  Mammogram 02/26/2020: possible mass in the left breast that warranted further evaluation.  No suspicious findings in the right breast.  Histology per Pathology Report: Partial Mastectomy     Receptor Status: ER(100% +), PR (95% +), Her2-neu (-), Ki-()  Biopsy: Left Breast 04/03/2020   Past/Anticipated interventions by surgeon, if any: Lumpectomy 05/02/2020  Past/Anticipated interventions by Radiation Oncology, if any: Dr. John Giovanni 05/29/2020     Past/Anticipated interventions by medical oncology, if any: Chemotherapy   Lymphedema issues, if any: No swelling noted in her arm.  Surgeon noted some swelling in her breast and tenderness.     Pain issues, if any:  No  SAFETY ISSUES:  Prior radiation? Left Breast 6/27-06/02/2010 with Dr. Tammi Klippel  Pacemaker/ICD? No  Possible current pregnancy? Postmenopausal  Is the patient on methotrexate? No  Current Complaints / other details:   -Left Lumpectomy 3 o'clock location 2011, radiation, Anastrozole.  + margins, declined re-excision.  -Willing to have treatment.  Cori Razor, RN 06/05/2020,8:19 AM

## 2020-06-06 NOTE — Progress Notes (Signed)
Radiation Oncology         [phone]) [phone] ________________________________  Initial Outpatient Consultation - Conducted via telephone due to current COVID-19 concerns for limiting patient exposure  I spoke with the patient to conduct this consult visit via telephone to spare the patient unnecessary potential exposure in the healthcare setting during the current COVID-19 pandemic. The patient was notified in advance and was offered a [place] meeting to allow for face to face communication but unfortunately reported that they did not have the appropriate resources/technology to support such a visit and instead preferred to proceed with a telephone consult.    Name: [name] [name]        MRN: [id number]  Date of Service: 06/05/2020 DOB: Oct 22, 1941  SJ:GGEZ, Weldon Picking, MD  Meda Klinefelter, MD     REFERRING PHYSICIAN: Meda Klinefelter, MD   DIAGNOSIS: The encounter diagnosis was Malignant neoplasm of upper-outer quadrant of left breast in female, estrogen receptor positive ([place]).   HISTORY OF PRESENT ILLNESS: [name] [name] is a 79 y.o. female seen at the request of Dr. Francesca Jewett for a locally recurrent ER/PR positive invasive ductal carcinoma of the left breast.  She was originally diagnosed in 2011 and underwent lumpectomy followed by radiation, as she had a positive margin.  There was some concern she could have had additional surgery but this was not pursued though the details are uncertain.  She completed 5 years of anastrozole.  More recently she was found to have concerns for local recurrence in the left breast seen on imaging in May 2021.  A 6 mm mass was seen at the 2 o'clock position and ultrasound-guided biopsy revealed a grade 2 invasive ductal carcinoma with mucinous features, her tumor was ER/PR positive, HER-2 negative.  Her case was reviewed in breast oncology conference at [facility], and Dr. Ernst Bowler offered her lumpectomy versus mastectomy, she was felt to be a better candidate  for breast conserving therapy and underwent lumpectomy on 05/02/2020.  Final pathology reveals a grade 2 invasive ductal carcinoma with mucinous and solid papillary features considered recurrent, this cancer measured 5 mm in greatest dimension, and her margin was clear of malignancy.  No additional lymph nodes were submitted.  She has met with Dr. Sherene Sires in [place], as well as the radiation team in [facility].  Given the proximity to [facility] from home she prefers to be treated closer to home.  Dr. Orvil Feil the heart has met with her about the different protocols for reirradiation and feels she is best suited in [place] or [place] due to technology of their linear accelerator and even not being able to accomplish prone positioning.   PREVIOUS RADIATION THERAPY: Yes    04/21/10-06/02/10: The left breast was treated to 50 Gy with a 10 Gy boost.   PAST MEDICAL HISTORY:  Past Medical History:  Diagnosis Date  . Anemia   . Anxiety   . Arthritis    back, shoulders ,  right hip  . Breast cancer, left [facility]) oncologist-- dr Audelia Hives [facility] in [place])--- per lov in epic no recurrence   dx 05/ 2011---- Stage I (T1,N0M0), DCIS, ER+;  s/p  left breast lumpectomy ,  completed radiation therapy 06-02-2010,  started antiestrogen  . Full dentures   . GERD (gastroesophageal reflux disease)   . History of colon polyps 2009   Dr. Collene Mares  . History of external beam radiation therapy 04-21-2010  to 06-02-2010   left breast cancer  . Hyperparathyroidism ([place])  per pt dx 03/ 2019,  was told by surgeon not high enough to worry about  . Hypertension   . Vitamin D deficiency   . Wears glasses        PAST SURGICAL HISTORY: Past Surgical History:  Procedure Laterality Date  . APPLICATION OF WOUND VAC Right 10/06/2018   Procedure: APPLICATION OF WOUND VAC;  Surgeon: Rod Can, MD;  Location: [facility];  Service: Orthopedics;  Laterality: Right;  . BREAST LUMPECTOMY Left 2011  . BREAST  LUMPECTOMY Left 05/02/2020  . COLONOSCOPY N/A 05/18/2013   Procedure: COLONOSCOPY;  Surgeon: Daneil Dolin, MD;  Location: [facility];  Service: Endoscopy;  Laterality: N/A;  9:30  . DILATATION & CURETTAGE/HYSTEROSCOPY WITH MYOSURE N/A 05/28/2016   Procedure: DILATATION & CURETTAGE/HYSTEROSCOPY WITH MYOSURE;  Surgeon: Servando Salina, MD;  Location: [facility] [place] [facility]S;  Service: Gynecology;  Laterality: N/A;  . INCISION AND DRAINAGE HIP Right 10/06/2018   Procedure: DEBRIDEMENT AND CLOSURE RIGHT HIP;  Surgeon: Rod Can, MD;  Location: [facility];  Service: Orthopedics;  Laterality: Right;  . TOTAL HIP ARTHROPLASTY Right 09/15/2018   Procedure: TOTAL HIP ARTHROPLASTY ANTERIOR APPROACH;  Surgeon: Rod Can, MD;  Location: [facility];  Service: Orthopedics;  Laterality: Right;  . TOTAL KNEE ARTHROPLASTY Bilateral left 05-07-2009;  right 02-07-2010   both by dr Theda Sers _0      FAMILY HISTORY:  Family History  Problem Relation Age of Onset  . Breast cancer Mother   . Colon cancer Neg Hx   . Lung cancer Neg Hx   . Ovarian cancer Neg Hx      SOCIAL HISTORY:  reports that she quit smoking about 45 years ago. Her smoking use included cigarettes. She quit after 15.00 years of use. She has never used smokeless tobacco. She reports that she does not drink alcohol and does not use drugs.  The next patient lives in [place].  She is separated.   ALLERGIES: Ace inhibitors and Sulfa antibiotics   MEDICATIONS:  Current Outpatient Medications  Medication Sig Dispense Refill  . amLODipine (NORVASC) 5 MG tablet Take 10 mg by mouth at bedtime.    . cetirizine (ZYRTEC) 10 MG tablet Take 10 mg by mouth every morning.     . Cholecalciferol 25 MCG (1000 UT) tablet Take by mouth.    . COMBIGAN 0.2-0.5 % ophthalmic solution SMARTSIG:1 Drop(s) In Eye(s) Every 12 Hours    . famotidine (PEPCID) 10 MG tablet Take 10 mg by mouth 2 (two) times daily.    . hydrALAZINE (APRESOLINE) 50 MG tablet Take 50 mg by mouth 2  (two) times daily.    Marland Kitchen latanoprost (XALATAN) 0.005 % ophthalmic solution Place 1 drop into both eyes at bedtime.    Marland Kitchen aspirin 81 MG chewable tablet Chew 1 tablet (81 mg total) by mouth 2 (two) times daily. (Patient not taking: Reported on 06/05/2020) 60 tablet 1  . docusate sodium (COLACE) 100 MG capsule Take 1 capsule (100 mg total) by mouth 2 (two) times daily. (Patient not taking: Reported on 06/05/2020) 60 capsule 1  . HYDROcodone-acetaminophen (NORCO/VICODIN) 5-325 MG tablet Take 1-2 tablets by mouth every 6 (six) hours as needed for moderate pain. (Patient not taking: Reported on 06/05/2020) 30 tablet 0  . ondansetron (ZOFRAN) 4 MG tablet Take 1 tablet (4 mg total) by mouth every 6 (six) hours as needed for nausea. (Patient not taking: Reported on 06/05/2020) 20 tablet 0  . ranitidine (ZANTAC) 150 MG tablet Take 150 mg by mouth daily as needed  for heartburn.  (Patient not taking: Reported on 06/05/2020)    . senna (SENOKOT) 8.6 MG TABS tablet Take 1 tablet (8.6 mg total) by mouth 2 (two) times daily. (Patient not taking: Reported on 06/05/2020) 120 each 0   No current facility-administered medications for this encounter.     REVIEW OF SYSTEMS: On review of systems the patient reports she is doing extremely well.  She does have a little bit of some fluid in her surgical site, and has been wearing a compression bra at nighttime, in the last few days this seems to have helped improve the swelling she has noticed.  She denies any redness or pain in the site, fevers or chills.  No other complaints are verbalized.     PHYSICAL EXAM:  Wt Readings from Last 3 Encounters:  06/05/20 244 lb (110.7 kg)  10/06/18 230 lb (104.3 kg)  09/15/18 233 lb (105.7 kg)   Unable to assess given encounter type.  ECOG = 0  0 - Asymptomatic (Fully active, able to carry on all predisease activities without restriction)  1 - Symptomatic but completely ambulatory (Restricted in physically strenuous activity but  ambulatory and able to carry out work of a light or sedentary nature. For example, light housework, office work)  2 - Symptomatic, <50% in bed during the day (Ambulatory and capable of all self care but unable to carry out any work activities. Up and about more than 50% of waking hours)  3 - Symptomatic, >50% in bed, but not bedbound (Capable of only limited self-care, confined to bed or chair 50% or more of waking hours)  4 - Bedbound (Completely disabled. Cannot carry on any self-care. Totally confined to bed or chair)  5 - Death   Eustace Pen MM, Creech RH, Tormey DC, et al. [phone]). "Toxicity and response criteria of the [facility]". [place] Oncol. 5 (6): 649-55    LABORATORY DATA:  Lab Results  Component Value Date   WBC 6.2 10/06/2018   HGB 10.7 (L) 10/06/2018   HCT 37.9 10/06/2018   MCV 101.1 (H) 10/06/2018   PLT 346 10/06/2018   Lab Results  Component Value Date   NA 138 10/06/2018   K 4.3 10/06/2018   CL 106 10/06/2018   CO2 24 10/06/2018   Lab Results  Component Value Date   ALT 10 03/17/2013   AST 14 03/17/2013   ALKPHOS 93 03/17/2013   BILITOT 0.3 03/17/2013      RADIOGRAPHY: No results found.     IMPRESSION/PLAN: 1. Recurrent ER/PR positive invasive ductal carcinoma of the left breast.  Dr. Lisbeth Renshaw discusses the pathology results and reviews her case, he discusses that there are scenarios for which considering reirradiation to the breast could be an appropriate option.  Historically patient only qualified for mastectomy however newer research has pointed to the safety of reirradiation and certain protocols have been better studied as a way to treat patients while reducing the risk of local recurrence.  He discusses the risks, benefits, short and long-term effects of radiotherapy, as well as the logistics and delivery.  The patient was offered twice daily treatment over 3 weeks to achieve a total of 45 Gy, the patient does not feel that she  can have enough support for transportation for that kind of schedule however, and as an alternative, he is offered a 5-week course of daily therapy with a plan daily dose of 1.8 Gy.  She is in agreement to proceed, and is aware  of the increased risks with reirradiation overall.  We will plan for her to come in for simulation in another week and a half, with deep inspiration breath-hold technique planning, she may be better suited for prone positioning so this will be determined at the time.  She will signed written consent at that time as well.  Given current concerns for patient exposure during the COVID-19 pandemic, this encounter was conducted via telephone.  The patient has provided two factor identification and has given verbal consent for this type of encounter and has been advised to only accept a meeting of this type in a secure network environment. The time spent during this encounter was 45 minutes including preparation, discussion, and coordination of the patient's care. The attendants for this meeting include Blenda Nicely, RN, Dr. Lisbeth Renshaw, Hayden Pedro  and [name].  During the encounter,  Blenda Nicely, RN, Dr. Lisbeth Renshaw, and Hayden Pedro were located at [facility] Radiation Oncology Department.  Viona Hosking Lara was located at home.  The above documentation reflects my direct findings during this shared patient visit. Please see the separate note by Dr. Lisbeth Renshaw on this date for the remainder of the patient's plan of care.    Carola Rhine, PAC

## 2020-06-07 DIAGNOSIS — I10 Essential (primary) hypertension: Secondary | ICD-10-CM | POA: Diagnosis not present

## 2020-06-07 DIAGNOSIS — M159 Polyosteoarthritis, unspecified: Secondary | ICD-10-CM | POA: Diagnosis not present

## 2020-06-18 ENCOUNTER — Other Ambulatory Visit: Payer: Self-pay

## 2020-06-18 ENCOUNTER — Ambulatory Visit
Admission: RE | Admit: 2020-06-18 | Discharge: 2020-06-18 | Disposition: A | Discharge status: Home / Self Care | Payer: Medicare Other | Source: Ambulatory Visit | Attending: Radiation Oncology | Admitting: Radiation Oncology

## 2020-06-18 DIAGNOSIS — Z510 Encounter for antineoplastic radiation therapy: Secondary | ICD-10-CM | POA: Insufficient documentation

## 2020-06-18 DIAGNOSIS — C50412 Malignant neoplasm of upper-outer quadrant of left female breast: Secondary | ICD-10-CM | POA: Diagnosis not present

## 2020-06-18 DIAGNOSIS — C50411 Malignant neoplasm of upper-outer quadrant of right female breast: Secondary | ICD-10-CM | POA: Diagnosis not present

## 2020-06-20 DIAGNOSIS — H40053 Ocular hypertension, bilateral: Secondary | ICD-10-CM | POA: Diagnosis not present

## 2020-06-20 DIAGNOSIS — H18413 Arcus senilis, bilateral: Secondary | ICD-10-CM | POA: Diagnosis not present

## 2020-06-20 DIAGNOSIS — H401431 Capsular glaucoma with pseudoexfoliation of lens, bilateral, mild stage: Secondary | ICD-10-CM | POA: Diagnosis not present

## 2020-06-20 DIAGNOSIS — H2513 Age-related nuclear cataract, bilateral: Secondary | ICD-10-CM | POA: Diagnosis not present

## 2020-06-22 DIAGNOSIS — Z510 Encounter for antineoplastic radiation therapy: Secondary | ICD-10-CM | POA: Diagnosis not present

## 2020-06-22 DIAGNOSIS — C50412 Malignant neoplasm of upper-outer quadrant of left female breast: Secondary | ICD-10-CM | POA: Diagnosis not present

## 2020-06-22 DIAGNOSIS — C50411 Malignant neoplasm of upper-outer quadrant of right female breast: Secondary | ICD-10-CM | POA: Diagnosis not present

## 2020-06-25 ENCOUNTER — Ambulatory Visit
Admission: RE | Admit: 2020-06-25 | Discharge: 2020-06-25 | Disposition: A | Discharge status: Home / Self Care | Payer: Medicare Other | Source: Ambulatory Visit | Attending: Radiation Oncology | Admitting: Radiation Oncology

## 2020-06-25 ENCOUNTER — Ambulatory Visit: Payer: Medicare Other | Admitting: Radiation Oncology

## 2020-06-25 ENCOUNTER — Other Ambulatory Visit: Payer: Self-pay

## 2020-06-25 DIAGNOSIS — C50411 Malignant neoplasm of upper-outer quadrant of right female breast: Secondary | ICD-10-CM | POA: Diagnosis not present

## 2020-06-25 DIAGNOSIS — Z510 Encounter for antineoplastic radiation therapy: Secondary | ICD-10-CM | POA: Diagnosis not present

## 2020-06-25 DIAGNOSIS — C50412 Malignant neoplasm of upper-outer quadrant of left female breast: Secondary | ICD-10-CM | POA: Diagnosis not present

## 2020-06-26 ENCOUNTER — Ambulatory Visit
Admission: RE | Admit: 2020-06-26 | Discharge: 2020-06-26 | Disposition: A | Discharge status: Home / Self Care | Payer: Medicare Other | Source: Ambulatory Visit | Attending: Radiation Oncology | Admitting: Radiation Oncology

## 2020-06-26 ENCOUNTER — Other Ambulatory Visit: Payer: Self-pay

## 2020-06-26 DIAGNOSIS — Z170 Estrogen receptor positive status [ER+]: Secondary | ICD-10-CM | POA: Diagnosis not present

## 2020-06-26 DIAGNOSIS — Z510 Encounter for antineoplastic radiation therapy: Secondary | ICD-10-CM | POA: Insufficient documentation

## 2020-06-26 DIAGNOSIS — Z171 Estrogen receptor negative status [ER-]: Secondary | ICD-10-CM | POA: Diagnosis not present

## 2020-06-26 DIAGNOSIS — C50411 Malignant neoplasm of upper-outer quadrant of right female breast: Secondary | ICD-10-CM | POA: Diagnosis not present

## 2020-06-26 DIAGNOSIS — C50412 Malignant neoplasm of upper-outer quadrant of left female breast: Secondary | ICD-10-CM | POA: Insufficient documentation

## 2020-06-27 ENCOUNTER — Ambulatory Visit
Admission: RE | Admit: 2020-06-27 | Discharge: 2020-06-27 | Disposition: A | Discharge status: Home / Self Care | Payer: Medicare Other | Source: Ambulatory Visit | Attending: Radiation Oncology | Admitting: Radiation Oncology

## 2020-06-27 ENCOUNTER — Other Ambulatory Visit: Payer: Self-pay

## 2020-06-27 DIAGNOSIS — C50412 Malignant neoplasm of upper-outer quadrant of left female breast: Secondary | ICD-10-CM | POA: Diagnosis not present

## 2020-06-27 DIAGNOSIS — Z171 Estrogen receptor negative status [ER-]: Secondary | ICD-10-CM | POA: Diagnosis not present

## 2020-06-27 DIAGNOSIS — C50411 Malignant neoplasm of upper-outer quadrant of right female breast: Secondary | ICD-10-CM | POA: Diagnosis not present

## 2020-06-27 DIAGNOSIS — Z170 Estrogen receptor positive status [ER+]: Secondary | ICD-10-CM | POA: Diagnosis not present

## 2020-06-27 DIAGNOSIS — Z510 Encounter for antineoplastic radiation therapy: Secondary | ICD-10-CM | POA: Diagnosis not present

## 2020-06-27 NOTE — Progress Notes (Signed)
Pt here for patient teaching.  Pt given Radiation and You booklet, skin care instructions, Alra deodorant and Sonafine.  Reviewed areas of pertinence such as fatigue, hair loss, skin changes, breast tenderness, breast swelling and taste changes . Pt able to give teach back of to pat skin and use unscented/gentle soap,apply Sonafine bid, avoid applying anything to skin within 4 hours of treatment, avoid wearing an under wire bra and to use an electric razor if they must shave. Pt verbalizes understanding of information given and will contact nursing with any questions or concerns.    Gloriajean Dell. Leonie Green, BSN

## 2020-06-28 ENCOUNTER — Other Ambulatory Visit: Payer: Self-pay

## 2020-06-28 ENCOUNTER — Ambulatory Visit
Admission: RE | Admit: 2020-06-28 | Discharge: 2020-06-28 | Disposition: A | Discharge status: Home / Self Care | Payer: Medicare Other | Source: Ambulatory Visit | Attending: Radiation Oncology | Admitting: Radiation Oncology

## 2020-06-28 DIAGNOSIS — C50412 Malignant neoplasm of upper-outer quadrant of left female breast: Secondary | ICD-10-CM | POA: Diagnosis not present

## 2020-06-28 DIAGNOSIS — Z510 Encounter for antineoplastic radiation therapy: Secondary | ICD-10-CM | POA: Diagnosis not present

## 2020-06-28 DIAGNOSIS — Z170 Estrogen receptor positive status [ER+]: Secondary | ICD-10-CM | POA: Diagnosis not present

## 2020-06-28 DIAGNOSIS — I10 Essential (primary) hypertension: Secondary | ICD-10-CM | POA: Diagnosis not present

## 2020-06-28 DIAGNOSIS — C50919 Malignant neoplasm of unspecified site of unspecified female breast: Secondary | ICD-10-CM | POA: Diagnosis not present

## 2020-06-28 DIAGNOSIS — Z171 Estrogen receptor negative status [ER-]: Secondary | ICD-10-CM | POA: Diagnosis not present

## 2020-06-28 DIAGNOSIS — M8580 Other specified disorders of bone density and structure, unspecified site: Secondary | ICD-10-CM | POA: Diagnosis not present

## 2020-06-28 DIAGNOSIS — C50411 Malignant neoplasm of upper-outer quadrant of right female breast: Secondary | ICD-10-CM | POA: Diagnosis not present

## 2020-06-28 DIAGNOSIS — E210 Primary hyperparathyroidism: Secondary | ICD-10-CM | POA: Diagnosis not present

## 2020-06-28 DIAGNOSIS — Z299 Encounter for prophylactic measures, unspecified: Secondary | ICD-10-CM | POA: Diagnosis not present

## 2020-06-28 MED ORDER — SONAFINE EX EMUL
1.0000 "application " | Freq: Once | CUTANEOUS | Status: AC
  Administered 2020-06-28: 1 via TOPICAL

## 2020-06-28 MED ORDER — ALRA NON-METALLIC DEODORANT (RAD-ONC)
1.0000 "application " | Freq: Once | TOPICAL | Status: AC
  Administered 2020-06-28: 1 via TOPICAL

## 2020-07-02 ENCOUNTER — Ambulatory Visit
Admission: RE | Admit: 2020-07-02 | Discharge: 2020-07-02 | Disposition: A | Discharge status: Home / Self Care | Payer: Medicare Other | Source: Ambulatory Visit | Attending: Radiation Oncology | Admitting: Radiation Oncology

## 2020-07-02 ENCOUNTER — Other Ambulatory Visit: Payer: Self-pay

## 2020-07-02 DIAGNOSIS — Z170 Estrogen receptor positive status [ER+]: Secondary | ICD-10-CM | POA: Diagnosis not present

## 2020-07-02 DIAGNOSIS — Z510 Encounter for antineoplastic radiation therapy: Secondary | ICD-10-CM | POA: Diagnosis not present

## 2020-07-02 DIAGNOSIS — C50412 Malignant neoplasm of upper-outer quadrant of left female breast: Secondary | ICD-10-CM | POA: Diagnosis not present

## 2020-07-02 DIAGNOSIS — Z171 Estrogen receptor negative status [ER-]: Secondary | ICD-10-CM | POA: Diagnosis not present

## 2020-07-02 DIAGNOSIS — C50411 Malignant neoplasm of upper-outer quadrant of right female breast: Secondary | ICD-10-CM | POA: Diagnosis not present

## 2020-07-03 ENCOUNTER — Ambulatory Visit
Admission: RE | Admit: 2020-07-03 | Discharge: 2020-07-03 | Disposition: A | Discharge status: Home / Self Care | Payer: Medicare Other | Source: Ambulatory Visit | Attending: Radiation Oncology | Admitting: Radiation Oncology

## 2020-07-03 DIAGNOSIS — Z510 Encounter for antineoplastic radiation therapy: Secondary | ICD-10-CM | POA: Diagnosis not present

## 2020-07-03 DIAGNOSIS — Z170 Estrogen receptor positive status [ER+]: Secondary | ICD-10-CM | POA: Diagnosis not present

## 2020-07-03 DIAGNOSIS — C50412 Malignant neoplasm of upper-outer quadrant of left female breast: Secondary | ICD-10-CM | POA: Diagnosis not present

## 2020-07-03 DIAGNOSIS — I700 Atherosclerosis of aorta: Secondary | ICD-10-CM | POA: Diagnosis not present

## 2020-07-03 DIAGNOSIS — Z171 Estrogen receptor negative status [ER-]: Secondary | ICD-10-CM | POA: Diagnosis not present

## 2020-07-03 DIAGNOSIS — C50411 Malignant neoplasm of upper-outer quadrant of right female breast: Secondary | ICD-10-CM | POA: Diagnosis not present

## 2020-07-04 ENCOUNTER — Ambulatory Visit
Admission: RE | Admit: 2020-07-04 | Discharge: 2020-07-04 | Disposition: A | Discharge status: Home / Self Care | Payer: Medicare Other | Source: Ambulatory Visit | Attending: Radiation Oncology | Admitting: Radiation Oncology

## 2020-07-04 ENCOUNTER — Other Ambulatory Visit: Payer: Self-pay

## 2020-07-04 DIAGNOSIS — Z171 Estrogen receptor negative status [ER-]: Secondary | ICD-10-CM | POA: Diagnosis not present

## 2020-07-04 DIAGNOSIS — C50412 Malignant neoplasm of upper-outer quadrant of left female breast: Secondary | ICD-10-CM | POA: Diagnosis not present

## 2020-07-04 DIAGNOSIS — Z170 Estrogen receptor positive status [ER+]: Secondary | ICD-10-CM | POA: Diagnosis not present

## 2020-07-04 DIAGNOSIS — H5213 Myopia, bilateral: Secondary | ICD-10-CM | POA: Diagnosis not present

## 2020-07-04 DIAGNOSIS — Z510 Encounter for antineoplastic radiation therapy: Secondary | ICD-10-CM | POA: Diagnosis not present

## 2020-07-04 DIAGNOSIS — C50411 Malignant neoplasm of upper-outer quadrant of right female breast: Secondary | ICD-10-CM | POA: Diagnosis not present

## 2020-07-05 ENCOUNTER — Ambulatory Visit
Admission: RE | Admit: 2020-07-05 | Discharge: 2020-07-05 | Disposition: A | Discharge status: Home / Self Care | Payer: Medicare Other | Source: Ambulatory Visit | Attending: Radiation Oncology | Admitting: Radiation Oncology

## 2020-07-05 DIAGNOSIS — C50411 Malignant neoplasm of upper-outer quadrant of right female breast: Secondary | ICD-10-CM | POA: Diagnosis not present

## 2020-07-05 DIAGNOSIS — Z510 Encounter for antineoplastic radiation therapy: Secondary | ICD-10-CM | POA: Diagnosis not present

## 2020-07-05 DIAGNOSIS — Z171 Estrogen receptor negative status [ER-]: Secondary | ICD-10-CM | POA: Diagnosis not present

## 2020-07-05 DIAGNOSIS — C50412 Malignant neoplasm of upper-outer quadrant of left female breast: Secondary | ICD-10-CM | POA: Diagnosis not present

## 2020-07-05 DIAGNOSIS — Z170 Estrogen receptor positive status [ER+]: Secondary | ICD-10-CM | POA: Diagnosis not present

## 2020-07-08 ENCOUNTER — Other Ambulatory Visit: Payer: Self-pay

## 2020-07-08 ENCOUNTER — Ambulatory Visit
Admission: RE | Admit: 2020-07-08 | Discharge: 2020-07-08 | Disposition: A | Discharge status: Home / Self Care | Payer: Medicare Other | Source: Ambulatory Visit | Attending: Radiation Oncology | Admitting: Radiation Oncology

## 2020-07-08 DIAGNOSIS — Z171 Estrogen receptor negative status [ER-]: Secondary | ICD-10-CM | POA: Diagnosis not present

## 2020-07-08 DIAGNOSIS — C50412 Malignant neoplasm of upper-outer quadrant of left female breast: Secondary | ICD-10-CM | POA: Diagnosis not present

## 2020-07-08 DIAGNOSIS — Z170 Estrogen receptor positive status [ER+]: Secondary | ICD-10-CM | POA: Diagnosis not present

## 2020-07-08 DIAGNOSIS — Z510 Encounter for antineoplastic radiation therapy: Secondary | ICD-10-CM | POA: Diagnosis not present

## 2020-07-08 DIAGNOSIS — C50411 Malignant neoplasm of upper-outer quadrant of right female breast: Secondary | ICD-10-CM | POA: Diagnosis not present

## 2020-07-09 ENCOUNTER — Ambulatory Visit
Admission: RE | Admit: 2020-07-09 | Discharge: 2020-07-09 | Disposition: A | Discharge status: Home / Self Care | Payer: Medicare Other | Source: Ambulatory Visit | Attending: Radiation Oncology | Admitting: Radiation Oncology

## 2020-07-09 DIAGNOSIS — Z171 Estrogen receptor negative status [ER-]: Secondary | ICD-10-CM | POA: Diagnosis not present

## 2020-07-09 DIAGNOSIS — Z510 Encounter for antineoplastic radiation therapy: Secondary | ICD-10-CM | POA: Diagnosis not present

## 2020-07-09 DIAGNOSIS — C50412 Malignant neoplasm of upper-outer quadrant of left female breast: Secondary | ICD-10-CM | POA: Diagnosis not present

## 2020-07-09 DIAGNOSIS — C50411 Malignant neoplasm of upper-outer quadrant of right female breast: Secondary | ICD-10-CM | POA: Diagnosis not present

## 2020-07-09 DIAGNOSIS — Z170 Estrogen receptor positive status [ER+]: Secondary | ICD-10-CM | POA: Diagnosis not present

## 2020-07-10 ENCOUNTER — Ambulatory Visit
Admission: RE | Admit: 2020-07-10 | Discharge: 2020-07-10 | Disposition: A | Discharge status: Home / Self Care | Payer: Medicare Other | Source: Ambulatory Visit | Attending: Radiation Oncology | Admitting: Radiation Oncology

## 2020-07-10 DIAGNOSIS — Z170 Estrogen receptor positive status [ER+]: Secondary | ICD-10-CM | POA: Diagnosis not present

## 2020-07-10 DIAGNOSIS — C50411 Malignant neoplasm of upper-outer quadrant of right female breast: Secondary | ICD-10-CM | POA: Diagnosis not present

## 2020-07-10 DIAGNOSIS — Z171 Estrogen receptor negative status [ER-]: Secondary | ICD-10-CM | POA: Diagnosis not present

## 2020-07-10 DIAGNOSIS — C50412 Malignant neoplasm of upper-outer quadrant of left female breast: Secondary | ICD-10-CM | POA: Diagnosis not present

## 2020-07-10 DIAGNOSIS — Z510 Encounter for antineoplastic radiation therapy: Secondary | ICD-10-CM | POA: Diagnosis not present

## 2020-07-11 ENCOUNTER — Other Ambulatory Visit: Payer: Self-pay

## 2020-07-11 ENCOUNTER — Ambulatory Visit
Admission: RE | Admit: 2020-07-11 | Discharge: 2020-07-11 | Disposition: A | Discharge status: Home / Self Care | Payer: Medicare Other | Source: Ambulatory Visit | Attending: Radiation Oncology | Admitting: Radiation Oncology

## 2020-07-11 DIAGNOSIS — C50412 Malignant neoplasm of upper-outer quadrant of left female breast: Secondary | ICD-10-CM | POA: Diagnosis not present

## 2020-07-11 DIAGNOSIS — Z96653 Presence of artificial knee joint, bilateral: Secondary | ICD-10-CM | POA: Diagnosis not present

## 2020-07-11 DIAGNOSIS — K219 Gastro-esophageal reflux disease without esophagitis: Secondary | ICD-10-CM | POA: Diagnosis not present

## 2020-07-11 DIAGNOSIS — Z923 Personal history of irradiation: Secondary | ICD-10-CM | POA: Diagnosis not present

## 2020-07-11 DIAGNOSIS — C50012 Malignant neoplasm of nipple and areola, left female breast: Secondary | ICD-10-CM | POA: Diagnosis not present

## 2020-07-11 DIAGNOSIS — C50411 Malignant neoplasm of upper-outer quadrant of right female breast: Secondary | ICD-10-CM | POA: Diagnosis not present

## 2020-07-11 DIAGNOSIS — Z510 Encounter for antineoplastic radiation therapy: Secondary | ICD-10-CM | POA: Diagnosis not present

## 2020-07-11 DIAGNOSIS — Z882 Allergy status to sulfonamides status: Secondary | ICD-10-CM | POA: Diagnosis not present

## 2020-07-11 DIAGNOSIS — D509 Iron deficiency anemia, unspecified: Secondary | ICD-10-CM | POA: Diagnosis not present

## 2020-07-11 DIAGNOSIS — Z170 Estrogen receptor positive status [ER+]: Secondary | ICD-10-CM | POA: Diagnosis not present

## 2020-07-11 DIAGNOSIS — I10 Essential (primary) hypertension: Secondary | ICD-10-CM | POA: Diagnosis not present

## 2020-07-11 DIAGNOSIS — Z853 Personal history of malignant neoplasm of breast: Secondary | ICD-10-CM | POA: Diagnosis not present

## 2020-07-11 DIAGNOSIS — M8580 Other specified disorders of bone density and structure, unspecified site: Secondary | ICD-10-CM | POA: Diagnosis not present

## 2020-07-11 DIAGNOSIS — Z23 Encounter for immunization: Secondary | ICD-10-CM | POA: Diagnosis not present

## 2020-07-11 DIAGNOSIS — Z171 Estrogen receptor negative status [ER-]: Secondary | ICD-10-CM | POA: Diagnosis not present

## 2020-07-11 DIAGNOSIS — E559 Vitamin D deficiency, unspecified: Secondary | ICD-10-CM | POA: Diagnosis not present

## 2020-07-11 DIAGNOSIS — Z888 Allergy status to other drugs, medicaments and biological substances status: Secondary | ICD-10-CM | POA: Diagnosis not present

## 2020-07-11 DIAGNOSIS — Z79899 Other long term (current) drug therapy: Secondary | ICD-10-CM | POA: Diagnosis not present

## 2020-07-12 ENCOUNTER — Ambulatory Visit
Admission: RE | Admit: 2020-07-12 | Discharge: 2020-07-12 | Disposition: A | Discharge status: Home / Self Care | Payer: Medicare Other | Source: Ambulatory Visit | Attending: Radiation Oncology | Admitting: Radiation Oncology

## 2020-07-12 ENCOUNTER — Other Ambulatory Visit: Payer: Self-pay

## 2020-07-12 DIAGNOSIS — C50412 Malignant neoplasm of upper-outer quadrant of left female breast: Secondary | ICD-10-CM

## 2020-07-12 DIAGNOSIS — Z510 Encounter for antineoplastic radiation therapy: Secondary | ICD-10-CM | POA: Diagnosis not present

## 2020-07-12 DIAGNOSIS — Z171 Estrogen receptor negative status [ER-]: Secondary | ICD-10-CM | POA: Diagnosis not present

## 2020-07-12 DIAGNOSIS — Z170 Estrogen receptor positive status [ER+]: Secondary | ICD-10-CM | POA: Diagnosis not present

## 2020-07-12 DIAGNOSIS — C50411 Malignant neoplasm of upper-outer quadrant of right female breast: Secondary | ICD-10-CM | POA: Diagnosis not present

## 2020-07-12 MED ORDER — SONAFINE EX EMUL
1.0000 "application " | Freq: Two times a day (BID) | CUTANEOUS | Status: DC
  Administered 2020-07-12: 1 via TOPICAL

## 2020-07-15 ENCOUNTER — Ambulatory Visit
Admission: RE | Admit: 2020-07-15 | Discharge: 2020-07-15 | Disposition: A | Discharge status: Home / Self Care | Payer: Medicare Other | Source: Ambulatory Visit | Attending: Radiation Oncology | Admitting: Radiation Oncology

## 2020-07-15 ENCOUNTER — Other Ambulatory Visit: Payer: Self-pay

## 2020-07-15 DIAGNOSIS — Z171 Estrogen receptor negative status [ER-]: Secondary | ICD-10-CM | POA: Diagnosis not present

## 2020-07-15 DIAGNOSIS — C50411 Malignant neoplasm of upper-outer quadrant of right female breast: Secondary | ICD-10-CM | POA: Diagnosis not present

## 2020-07-15 DIAGNOSIS — Z170 Estrogen receptor positive status [ER+]: Secondary | ICD-10-CM | POA: Diagnosis not present

## 2020-07-15 DIAGNOSIS — Z510 Encounter for antineoplastic radiation therapy: Secondary | ICD-10-CM | POA: Diagnosis not present

## 2020-07-15 DIAGNOSIS — C50412 Malignant neoplasm of upper-outer quadrant of left female breast: Secondary | ICD-10-CM | POA: Diagnosis not present

## 2020-07-16 ENCOUNTER — Other Ambulatory Visit: Payer: Self-pay

## 2020-07-16 ENCOUNTER — Ambulatory Visit
Admission: RE | Admit: 2020-07-16 | Discharge: 2020-07-16 | Disposition: A | Discharge status: Home / Self Care | Payer: Medicare Other | Source: Ambulatory Visit | Attending: Radiation Oncology | Admitting: Radiation Oncology

## 2020-07-16 DIAGNOSIS — Z171 Estrogen receptor negative status [ER-]: Secondary | ICD-10-CM | POA: Diagnosis not present

## 2020-07-16 DIAGNOSIS — Z170 Estrogen receptor positive status [ER+]: Secondary | ICD-10-CM | POA: Diagnosis not present

## 2020-07-16 DIAGNOSIS — Z510 Encounter for antineoplastic radiation therapy: Secondary | ICD-10-CM | POA: Diagnosis not present

## 2020-07-16 DIAGNOSIS — C50411 Malignant neoplasm of upper-outer quadrant of right female breast: Secondary | ICD-10-CM | POA: Diagnosis not present

## 2020-07-16 DIAGNOSIS — C50412 Malignant neoplasm of upper-outer quadrant of left female breast: Secondary | ICD-10-CM | POA: Diagnosis not present

## 2020-07-17 ENCOUNTER — Ambulatory Visit
Admission: RE | Admit: 2020-07-17 | Discharge: 2020-07-17 | Disposition: A | Discharge status: Home / Self Care | Payer: Medicare Other | Source: Ambulatory Visit | Attending: Radiation Oncology | Admitting: Radiation Oncology

## 2020-07-17 DIAGNOSIS — C50412 Malignant neoplasm of upper-outer quadrant of left female breast: Secondary | ICD-10-CM | POA: Diagnosis not present

## 2020-07-17 DIAGNOSIS — C50411 Malignant neoplasm of upper-outer quadrant of right female breast: Secondary | ICD-10-CM | POA: Diagnosis not present

## 2020-07-17 DIAGNOSIS — Z171 Estrogen receptor negative status [ER-]: Secondary | ICD-10-CM | POA: Diagnosis not present

## 2020-07-17 DIAGNOSIS — Z510 Encounter for antineoplastic radiation therapy: Secondary | ICD-10-CM | POA: Diagnosis not present

## 2020-07-17 DIAGNOSIS — Z170 Estrogen receptor positive status [ER+]: Secondary | ICD-10-CM | POA: Diagnosis not present

## 2020-07-18 ENCOUNTER — Ambulatory Visit
Admission: RE | Admit: 2020-07-18 | Discharge: 2020-07-18 | Disposition: A | Discharge status: Home / Self Care | Payer: Medicare Other | Source: Ambulatory Visit | Attending: Radiation Oncology | Admitting: Radiation Oncology

## 2020-07-18 DIAGNOSIS — Z171 Estrogen receptor negative status [ER-]: Secondary | ICD-10-CM | POA: Diagnosis not present

## 2020-07-18 DIAGNOSIS — Z510 Encounter for antineoplastic radiation therapy: Secondary | ICD-10-CM | POA: Diagnosis not present

## 2020-07-18 DIAGNOSIS — Z170 Estrogen receptor positive status [ER+]: Secondary | ICD-10-CM | POA: Diagnosis not present

## 2020-07-18 DIAGNOSIS — C50412 Malignant neoplasm of upper-outer quadrant of left female breast: Secondary | ICD-10-CM | POA: Diagnosis not present

## 2020-07-18 DIAGNOSIS — C50411 Malignant neoplasm of upper-outer quadrant of right female breast: Secondary | ICD-10-CM | POA: Diagnosis not present

## 2020-07-19 ENCOUNTER — Ambulatory Visit
Admission: RE | Admit: 2020-07-19 | Discharge: 2020-07-19 | Disposition: A | Discharge status: Home / Self Care | Payer: Medicare Other | Source: Ambulatory Visit | Attending: Radiation Oncology | Admitting: Radiation Oncology

## 2020-07-19 DIAGNOSIS — Z170 Estrogen receptor positive status [ER+]: Secondary | ICD-10-CM | POA: Diagnosis not present

## 2020-07-19 DIAGNOSIS — Z510 Encounter for antineoplastic radiation therapy: Secondary | ICD-10-CM | POA: Diagnosis not present

## 2020-07-19 DIAGNOSIS — C50411 Malignant neoplasm of upper-outer quadrant of right female breast: Secondary | ICD-10-CM | POA: Diagnosis not present

## 2020-07-19 DIAGNOSIS — Z171 Estrogen receptor negative status [ER-]: Secondary | ICD-10-CM | POA: Diagnosis not present

## 2020-07-19 DIAGNOSIS — C50412 Malignant neoplasm of upper-outer quadrant of left female breast: Secondary | ICD-10-CM | POA: Diagnosis not present

## 2020-07-22 ENCOUNTER — Ambulatory Visit
Admission: RE | Admit: 2020-07-22 | Discharge: 2020-07-22 | Disposition: A | Discharge status: Home / Self Care | Payer: Medicare Other | Source: Ambulatory Visit | Attending: Radiation Oncology | Admitting: Radiation Oncology

## 2020-07-22 ENCOUNTER — Other Ambulatory Visit: Payer: Self-pay

## 2020-07-22 DIAGNOSIS — Z510 Encounter for antineoplastic radiation therapy: Secondary | ICD-10-CM | POA: Diagnosis not present

## 2020-07-22 DIAGNOSIS — Z170 Estrogen receptor positive status [ER+]: Secondary | ICD-10-CM | POA: Diagnosis not present

## 2020-07-22 DIAGNOSIS — C50412 Malignant neoplasm of upper-outer quadrant of left female breast: Secondary | ICD-10-CM | POA: Diagnosis not present

## 2020-07-22 DIAGNOSIS — Z171 Estrogen receptor negative status [ER-]: Secondary | ICD-10-CM | POA: Diagnosis not present

## 2020-07-22 DIAGNOSIS — C50411 Malignant neoplasm of upper-outer quadrant of right female breast: Secondary | ICD-10-CM | POA: Diagnosis not present

## 2020-07-23 ENCOUNTER — Ambulatory Visit
Admission: RE | Admit: 2020-07-23 | Discharge: 2020-07-23 | Disposition: A | Discharge status: Home / Self Care | Payer: Medicare Other | Source: Ambulatory Visit | Attending: Radiation Oncology | Admitting: Radiation Oncology

## 2020-07-23 DIAGNOSIS — Z510 Encounter for antineoplastic radiation therapy: Secondary | ICD-10-CM | POA: Diagnosis not present

## 2020-07-23 DIAGNOSIS — Z170 Estrogen receptor positive status [ER+]: Secondary | ICD-10-CM | POA: Diagnosis not present

## 2020-07-23 DIAGNOSIS — Z171 Estrogen receptor negative status [ER-]: Secondary | ICD-10-CM | POA: Diagnosis not present

## 2020-07-23 DIAGNOSIS — C50412 Malignant neoplasm of upper-outer quadrant of left female breast: Secondary | ICD-10-CM | POA: Diagnosis not present

## 2020-07-23 DIAGNOSIS — C50411 Malignant neoplasm of upper-outer quadrant of right female breast: Secondary | ICD-10-CM | POA: Diagnosis not present

## 2020-07-24 ENCOUNTER — Ambulatory Visit
Admission: RE | Admit: 2020-07-24 | Discharge: 2020-07-24 | Disposition: A | Discharge status: Home / Self Care | Payer: Medicare Other | Source: Ambulatory Visit | Attending: Radiation Oncology | Admitting: Radiation Oncology

## 2020-07-24 DIAGNOSIS — Z510 Encounter for antineoplastic radiation therapy: Secondary | ICD-10-CM | POA: Diagnosis not present

## 2020-07-24 DIAGNOSIS — C50412 Malignant neoplasm of upper-outer quadrant of left female breast: Secondary | ICD-10-CM | POA: Diagnosis not present

## 2020-07-24 DIAGNOSIS — Z170 Estrogen receptor positive status [ER+]: Secondary | ICD-10-CM | POA: Diagnosis not present

## 2020-07-24 DIAGNOSIS — Z171 Estrogen receptor negative status [ER-]: Secondary | ICD-10-CM | POA: Diagnosis not present

## 2020-07-24 DIAGNOSIS — C50411 Malignant neoplasm of upper-outer quadrant of right female breast: Secondary | ICD-10-CM | POA: Diagnosis not present

## 2020-07-25 ENCOUNTER — Ambulatory Visit
Admission: RE | Admit: 2020-07-25 | Discharge: 2020-07-25 | Disposition: A | Discharge status: Home / Self Care | Payer: Medicare Other | Source: Ambulatory Visit | Attending: Radiation Oncology | Admitting: Radiation Oncology

## 2020-07-25 DIAGNOSIS — C50412 Malignant neoplasm of upper-outer quadrant of left female breast: Secondary | ICD-10-CM | POA: Diagnosis not present

## 2020-07-25 DIAGNOSIS — C50411 Malignant neoplasm of upper-outer quadrant of right female breast: Secondary | ICD-10-CM | POA: Diagnosis not present

## 2020-07-25 DIAGNOSIS — Z171 Estrogen receptor negative status [ER-]: Secondary | ICD-10-CM | POA: Diagnosis not present

## 2020-07-25 DIAGNOSIS — M159 Polyosteoarthritis, unspecified: Secondary | ICD-10-CM | POA: Diagnosis not present

## 2020-07-25 DIAGNOSIS — Z510 Encounter for antineoplastic radiation therapy: Secondary | ICD-10-CM | POA: Diagnosis not present

## 2020-07-25 DIAGNOSIS — I10 Essential (primary) hypertension: Secondary | ICD-10-CM | POA: Diagnosis not present

## 2020-07-25 DIAGNOSIS — Z170 Estrogen receptor positive status [ER+]: Secondary | ICD-10-CM | POA: Diagnosis not present

## 2020-07-26 ENCOUNTER — Ambulatory Visit
Admission: RE | Admit: 2020-07-26 | Discharge: 2020-07-26 | Disposition: A | Discharge status: Home / Self Care | Payer: Medicare Other | Source: Ambulatory Visit | Attending: Radiation Oncology | Admitting: Radiation Oncology

## 2020-07-26 DIAGNOSIS — Z510 Encounter for antineoplastic radiation therapy: Secondary | ICD-10-CM | POA: Insufficient documentation

## 2020-07-26 DIAGNOSIS — Z170 Estrogen receptor positive status [ER+]: Secondary | ICD-10-CM | POA: Insufficient documentation

## 2020-07-26 DIAGNOSIS — C50412 Malignant neoplasm of upper-outer quadrant of left female breast: Secondary | ICD-10-CM | POA: Diagnosis not present

## 2020-07-26 DIAGNOSIS — Z171 Estrogen receptor negative status [ER-]: Secondary | ICD-10-CM | POA: Diagnosis not present

## 2020-07-26 DIAGNOSIS — C50411 Malignant neoplasm of upper-outer quadrant of right female breast: Secondary | ICD-10-CM | POA: Insufficient documentation

## 2020-07-29 ENCOUNTER — Ambulatory Visit
Admission: RE | Admit: 2020-07-29 | Discharge: 2020-07-29 | Disposition: A | Discharge status: Home / Self Care | Payer: Medicare Other | Source: Ambulatory Visit | Attending: Radiation Oncology | Admitting: Radiation Oncology

## 2020-07-29 DIAGNOSIS — C50412 Malignant neoplasm of upper-outer quadrant of left female breast: Secondary | ICD-10-CM | POA: Diagnosis not present

## 2020-07-29 DIAGNOSIS — Z170 Estrogen receptor positive status [ER+]: Secondary | ICD-10-CM | POA: Diagnosis not present

## 2020-07-29 DIAGNOSIS — C50411 Malignant neoplasm of upper-outer quadrant of right female breast: Secondary | ICD-10-CM | POA: Diagnosis not present

## 2020-07-29 DIAGNOSIS — Z171 Estrogen receptor negative status [ER-]: Secondary | ICD-10-CM | POA: Diagnosis not present

## 2020-07-29 DIAGNOSIS — Z510 Encounter for antineoplastic radiation therapy: Secondary | ICD-10-CM | POA: Diagnosis not present

## 2020-07-30 ENCOUNTER — Ambulatory Visit: Payer: Medicare Other

## 2020-07-30 ENCOUNTER — Other Ambulatory Visit: Payer: Self-pay

## 2020-07-30 ENCOUNTER — Encounter: Payer: Self-pay | Admitting: Radiation Oncology

## 2020-07-30 ENCOUNTER — Ambulatory Visit
Admission: RE | Admit: 2020-07-30 | Discharge: 2020-07-30 | Disposition: A | Discharge status: Home / Self Care | Payer: Medicare Other | Source: Ambulatory Visit | Attending: Radiation Oncology | Admitting: Radiation Oncology

## 2020-07-30 DIAGNOSIS — Z510 Encounter for antineoplastic radiation therapy: Secondary | ICD-10-CM | POA: Diagnosis not present

## 2020-07-30 DIAGNOSIS — C50412 Malignant neoplasm of upper-outer quadrant of left female breast: Secondary | ICD-10-CM | POA: Diagnosis not present

## 2020-07-30 DIAGNOSIS — Z170 Estrogen receptor positive status [ER+]: Secondary | ICD-10-CM | POA: Diagnosis not present

## 2020-07-30 DIAGNOSIS — C50411 Malignant neoplasm of upper-outer quadrant of right female breast: Secondary | ICD-10-CM | POA: Diagnosis not present

## 2020-07-30 DIAGNOSIS — Z171 Estrogen receptor negative status [ER-]: Secondary | ICD-10-CM | POA: Diagnosis not present

## 2020-07-31 ENCOUNTER — Ambulatory Visit: Payer: Medicare Other

## 2020-08-22 DIAGNOSIS — H401431 Capsular glaucoma with pseudoexfoliation of lens, bilateral, mild stage: Secondary | ICD-10-CM | POA: Diagnosis not present

## 2020-08-22 DIAGNOSIS — H40053 Ocular hypertension, bilateral: Secondary | ICD-10-CM | POA: Diagnosis not present

## 2020-08-23 DIAGNOSIS — M159 Polyosteoarthritis, unspecified: Secondary | ICD-10-CM | POA: Diagnosis not present

## 2020-08-23 DIAGNOSIS — I10 Essential (primary) hypertension: Secondary | ICD-10-CM | POA: Diagnosis not present

## 2020-09-02 DIAGNOSIS — C50919 Malignant neoplasm of unspecified site of unspecified female breast: Secondary | ICD-10-CM | POA: Diagnosis not present

## 2020-09-02 DIAGNOSIS — Z299 Encounter for prophylactic measures, unspecified: Secondary | ICD-10-CM | POA: Diagnosis not present

## 2020-09-02 DIAGNOSIS — I10 Essential (primary) hypertension: Secondary | ICD-10-CM | POA: Diagnosis not present

## 2020-09-02 DIAGNOSIS — N182 Chronic kidney disease, stage 2 (mild): Secondary | ICD-10-CM | POA: Diagnosis not present

## 2020-09-09 ENCOUNTER — Telehealth: Payer: Self-pay | Admitting: Radiation Oncology

## 2020-09-09 NOTE — Telephone Encounter (Signed)
  Radiation Oncology         [phone]) [phone] ________________________________  Name: [name] [name] MRN: [id number]  Date of Service: 09/09/2020  DOB: [birth year]  Post Treatment Telephone Note  Diagnosis:  Recurrent ER/PR positive invasive ductal carcinoma of the left breast.  Interval Since Last Radiation:  6 weeks   06/25/20-07/30/20: The left breast was retreated with radiation to a dose of 45 Gy in 25 fractions  04/21/10-06/02/10: The left breast was treated to 50 Gy with a 10 Gy boost.   Narrative:  The patient was contacted today for routine follow-up. During treatment she did very well with radiotherapy and did not have significant desquamation. She reports she states her skin is healing up nicely. She denies any concerns with her skin care regimen. She has been anxious and has had increase in her blood pressure. She's had some stress in her life with worry of her great grandchildren's health.  Impression/Plan: 1. Recurrent ER/PR positive invasive ductal carcinoma of the left breast.. The patient has been doing well since completion of radiotherapy. We discussed that we would be happy to continue to follow her as needed, but she will also continue to follow up with her team at [facility] in medical oncology. She was counseled on skin care as well as measures to avoid sun exposure to this area.  2. Anxiousness and high blood pressure. The patient is interested in continuing her diet and exercise recommendations as well as talking with our cancer center chaplain. I'll reach out to Lorrin Jackson to help make contact.     Carola Rhine, PAC

## 2020-09-10 ENCOUNTER — Encounter: Payer: Self-pay | Admitting: General Practice

## 2020-09-10 NOTE — Progress Notes (Signed)
[place] Spiritual Care Note  Reached Ms Brar by phone per referral from [facility] re spiritual/emotional support for anxiety. Ms Hallgren reports that her anxiety is largely due to fluctuating blood pressure, because HBP stirs up her fear of having a stroke due to family history. This anxiety in turn can make sleep difficult. She reports better sleep recently and is hopeful that her PCP appointment on 12/9 may yield a positive report. She notes that she had past success with anxiety medication but prefers to avoid meds due to fear of addiction. Instead, she is using several spiritual strategies to help with anxiety and insomnia: playing spiritual songs, repeating positive affirmations, and praying. She also reports a discipline of praying and periodic fasting during portions of the day. Encouraged her to speak with PCP about these and other tools he might recommend. We plan to follow up by phone ca 12/10 to check in about how her strategies and the BP medication are working for her. She also knows to contact chaplain as needed/desired in the meantime.   [place], [place], [facility] Pager [phone] Voicemail [phone]

## 2020-09-23 DIAGNOSIS — H401431 Capsular glaucoma with pseudoexfoliation of lens, bilateral, mild stage: Secondary | ICD-10-CM | POA: Diagnosis not present

## 2020-09-23 DIAGNOSIS — H40053 Ocular hypertension, bilateral: Secondary | ICD-10-CM | POA: Diagnosis not present

## 2020-09-23 NOTE — Progress Notes (Signed)
  Radiation Oncology         [phone]) [phone] ________________________________  Name: [name] [name] MRN: [id number]  Date: 07/30/2020  DOB: 1940/12/12  End of Treatment Note  Diagnosis:   left-sided breast cancer     Indication for treatment:  Curative       Radiation treatment dates:   06/25/20 - 07/30/20  Site/dose:   The patient initially received a dose of 45 Gy in 25 fractions to the breast using conformal fields targeting the lumpectomy cavity plus margin. This was delivered using a 3-D conformal technique.   Narrative: The patient tolerated radiation treatment relatively well.   The patient had some expected skin irritation as she progressed during treatment.   Plan: The patient has completed radiation treatment. The patient will return to radiation oncology clinic for routine followup in one month. I advised the patient to call or return sooner if they have any questions or concerns related to their recovery or treatment. ________________________________  Jodelle Gross, M.D., Ph.D.

## 2020-09-24 DIAGNOSIS — M159 Polyosteoarthritis, unspecified: Secondary | ICD-10-CM | POA: Diagnosis not present

## 2020-09-24 DIAGNOSIS — I10 Essential (primary) hypertension: Secondary | ICD-10-CM | POA: Diagnosis not present

## 2020-10-03 DIAGNOSIS — Z299 Encounter for prophylactic measures, unspecified: Secondary | ICD-10-CM | POA: Diagnosis not present

## 2020-10-03 DIAGNOSIS — I10 Essential (primary) hypertension: Secondary | ICD-10-CM | POA: Diagnosis not present

## 2020-10-03 DIAGNOSIS — E210 Primary hyperparathyroidism: Secondary | ICD-10-CM | POA: Diagnosis not present

## 2020-10-03 DIAGNOSIS — I700 Atherosclerosis of aorta: Secondary | ICD-10-CM | POA: Diagnosis not present

## 2020-10-03 DIAGNOSIS — I739 Peripheral vascular disease, unspecified: Secondary | ICD-10-CM | POA: Diagnosis not present

## 2020-10-04 ENCOUNTER — Encounter: Payer: Self-pay | Admitting: General Practice

## 2020-10-04 NOTE — Progress Notes (Signed)
[place] Spiritual Care Note  Reached [name] [name] by phone as planned for follow-up pastoral check-in. She reports that her BP is much better, which in turn has reduced her anxiety and increased the quality of her sleep, so she is doing very well. She also notes that winter depression can be hard for her, but that she does water aerobics 3x/week, has a weekly shopping excursion with a buddy, and does some daily caregiving, all of which keep her busy and engaged in a way that helps her mood. We plan to follow up by phone in the new year.   [place], [place], [facility] Pager [phone] Voicemail [phone]

## 2020-10-07 DIAGNOSIS — E559 Vitamin D deficiency, unspecified: Secondary | ICD-10-CM | POA: Diagnosis not present

## 2020-10-07 DIAGNOSIS — C50012 Malignant neoplasm of nipple and areola, left female breast: Secondary | ICD-10-CM | POA: Diagnosis not present

## 2020-10-07 DIAGNOSIS — Z170 Estrogen receptor positive status [ER+]: Secondary | ICD-10-CM | POA: Diagnosis not present

## 2020-10-10 DIAGNOSIS — C50012 Malignant neoplasm of nipple and areola, left female breast: Secondary | ICD-10-CM | POA: Diagnosis not present

## 2020-10-10 DIAGNOSIS — Z170 Estrogen receptor positive status [ER+]: Secondary | ICD-10-CM | POA: Diagnosis not present

## 2020-10-15 DIAGNOSIS — R0602 Shortness of breath: Secondary | ICD-10-CM | POA: Diagnosis not present

## 2020-10-15 DIAGNOSIS — Z923 Personal history of irradiation: Secondary | ICD-10-CM | POA: Diagnosis not present

## 2020-10-15 DIAGNOSIS — Z853 Personal history of malignant neoplasm of breast: Secondary | ICD-10-CM | POA: Diagnosis not present

## 2020-10-15 DIAGNOSIS — Z79811 Long term (current) use of aromatase inhibitors: Secondary | ICD-10-CM | POA: Diagnosis not present

## 2020-10-15 DIAGNOSIS — R278 Other lack of coordination: Secondary | ICD-10-CM | POA: Diagnosis not present

## 2020-10-15 DIAGNOSIS — R2689 Other abnormalities of gait and mobility: Secondary | ICD-10-CM | POA: Diagnosis not present

## 2020-10-22 DIAGNOSIS — Z923 Personal history of irradiation: Secondary | ICD-10-CM | POA: Diagnosis not present

## 2020-10-22 DIAGNOSIS — R2689 Other abnormalities of gait and mobility: Secondary | ICD-10-CM | POA: Diagnosis not present

## 2020-10-22 DIAGNOSIS — Z79811 Long term (current) use of aromatase inhibitors: Secondary | ICD-10-CM | POA: Diagnosis not present

## 2020-10-22 DIAGNOSIS — Z853 Personal history of malignant neoplasm of breast: Secondary | ICD-10-CM | POA: Diagnosis not present

## 2020-10-22 DIAGNOSIS — R0602 Shortness of breath: Secondary | ICD-10-CM | POA: Diagnosis not present

## 2020-10-22 DIAGNOSIS — R278 Other lack of coordination: Secondary | ICD-10-CM | POA: Diagnosis not present

## 2020-10-24 DIAGNOSIS — R278 Other lack of coordination: Secondary | ICD-10-CM | POA: Diagnosis not present

## 2020-10-24 DIAGNOSIS — Z79811 Long term (current) use of aromatase inhibitors: Secondary | ICD-10-CM | POA: Diagnosis not present

## 2020-10-24 DIAGNOSIS — Z853 Personal history of malignant neoplasm of breast: Secondary | ICD-10-CM | POA: Diagnosis not present

## 2020-10-24 DIAGNOSIS — R0602 Shortness of breath: Secondary | ICD-10-CM | POA: Diagnosis not present

## 2020-10-24 DIAGNOSIS — Z923 Personal history of irradiation: Secondary | ICD-10-CM | POA: Diagnosis not present

## 2020-10-24 DIAGNOSIS — R2689 Other abnormalities of gait and mobility: Secondary | ICD-10-CM | POA: Diagnosis not present

## 2020-10-30 DIAGNOSIS — J069 Acute upper respiratory infection, unspecified: Secondary | ICD-10-CM | POA: Diagnosis not present

## 2020-10-30 DIAGNOSIS — Z299 Encounter for prophylactic measures, unspecified: Secondary | ICD-10-CM | POA: Diagnosis not present

## 2020-10-30 DIAGNOSIS — Z20822 Contact with and (suspected) exposure to covid-19: Secondary | ICD-10-CM | POA: Diagnosis not present

## 2020-10-30 DIAGNOSIS — I10 Essential (primary) hypertension: Secondary | ICD-10-CM | POA: Diagnosis not present

## 2020-11-04 DIAGNOSIS — Z299 Encounter for prophylactic measures, unspecified: Secondary | ICD-10-CM | POA: Diagnosis not present

## 2020-11-04 DIAGNOSIS — I10 Essential (primary) hypertension: Secondary | ICD-10-CM | POA: Diagnosis not present

## 2020-11-04 DIAGNOSIS — G4700 Insomnia, unspecified: Secondary | ICD-10-CM | POA: Diagnosis not present

## 2020-11-04 DIAGNOSIS — K219 Gastro-esophageal reflux disease without esophagitis: Secondary | ICD-10-CM | POA: Diagnosis not present

## 2020-11-04 DIAGNOSIS — U071 COVID-19: Secondary | ICD-10-CM | POA: Diagnosis not present

## 2020-11-21 DIAGNOSIS — Z79811 Long term (current) use of aromatase inhibitors: Secondary | ICD-10-CM | POA: Diagnosis not present

## 2020-11-21 DIAGNOSIS — R2681 Unsteadiness on feet: Secondary | ICD-10-CM | POA: Diagnosis not present

## 2020-11-21 DIAGNOSIS — Z923 Personal history of irradiation: Secondary | ICD-10-CM | POA: Diagnosis not present

## 2020-11-21 DIAGNOSIS — Z853 Personal history of malignant neoplasm of breast: Secondary | ICD-10-CM | POA: Diagnosis not present

## 2020-11-21 DIAGNOSIS — Z7409 Other reduced mobility: Secondary | ICD-10-CM | POA: Diagnosis not present

## 2020-11-25 DIAGNOSIS — H40053 Ocular hypertension, bilateral: Secondary | ICD-10-CM | POA: Diagnosis not present

## 2020-11-25 DIAGNOSIS — H401431 Capsular glaucoma with pseudoexfoliation of lens, bilateral, mild stage: Secondary | ICD-10-CM | POA: Diagnosis not present

## 2020-11-26 DIAGNOSIS — Z96641 Presence of right artificial hip joint: Secondary | ICD-10-CM | POA: Diagnosis not present

## 2020-12-05 DIAGNOSIS — H401431 Capsular glaucoma with pseudoexfoliation of lens, bilateral, mild stage: Secondary | ICD-10-CM | POA: Diagnosis not present

## 2020-12-05 DIAGNOSIS — H40053 Ocular hypertension, bilateral: Secondary | ICD-10-CM | POA: Diagnosis not present

## 2021-01-02 DIAGNOSIS — H401431 Capsular glaucoma with pseudoexfoliation of lens, bilateral, mild stage: Secondary | ICD-10-CM | POA: Diagnosis not present

## 2021-01-02 DIAGNOSIS — H40053 Ocular hypertension, bilateral: Secondary | ICD-10-CM | POA: Diagnosis not present

## 2021-01-06 DIAGNOSIS — Z170 Estrogen receptor positive status [ER+]: Secondary | ICD-10-CM | POA: Diagnosis not present

## 2021-01-06 DIAGNOSIS — C50012 Malignant neoplasm of nipple and areola, left female breast: Secondary | ICD-10-CM | POA: Diagnosis not present

## 2021-01-06 DIAGNOSIS — D508 Other iron deficiency anemias: Secondary | ICD-10-CM | POA: Diagnosis not present

## 2021-01-06 DIAGNOSIS — E559 Vitamin D deficiency, unspecified: Secondary | ICD-10-CM | POA: Diagnosis not present

## 2021-01-08 DIAGNOSIS — Z7189 Other specified counseling: Secondary | ICD-10-CM | POA: Diagnosis not present

## 2021-01-08 DIAGNOSIS — Z0000 Encounter for general adult medical examination without abnormal findings: Secondary | ICD-10-CM | POA: Diagnosis not present

## 2021-01-08 DIAGNOSIS — R5383 Other fatigue: Secondary | ICD-10-CM | POA: Diagnosis not present

## 2021-01-08 DIAGNOSIS — Z79811 Long term (current) use of aromatase inhibitors: Secondary | ICD-10-CM | POA: Diagnosis not present

## 2021-01-08 DIAGNOSIS — I10 Essential (primary) hypertension: Secondary | ICD-10-CM | POA: Diagnosis not present

## 2021-01-08 DIAGNOSIS — Z299 Encounter for prophylactic measures, unspecified: Secondary | ICD-10-CM | POA: Diagnosis not present

## 2021-01-08 DIAGNOSIS — Z79899 Other long term (current) drug therapy: Secondary | ICD-10-CM | POA: Diagnosis not present

## 2021-01-08 DIAGNOSIS — E7800 Pure hypercholesterolemia, unspecified: Secondary | ICD-10-CM | POA: Diagnosis not present

## 2021-01-08 DIAGNOSIS — Z9289 Personal history of other medical treatment: Secondary | ICD-10-CM | POA: Diagnosis not present

## 2021-01-08 DIAGNOSIS — Z1231 Encounter for screening mammogram for malignant neoplasm of breast: Secondary | ICD-10-CM | POA: Diagnosis not present

## 2021-01-08 DIAGNOSIS — E559 Vitamin D deficiency, unspecified: Secondary | ICD-10-CM | POA: Diagnosis not present

## 2021-01-08 DIAGNOSIS — M8580 Other specified disorders of bone density and structure, unspecified site: Secondary | ICD-10-CM | POA: Diagnosis not present

## 2021-01-08 DIAGNOSIS — Z170 Estrogen receptor positive status [ER+]: Secondary | ICD-10-CM | POA: Diagnosis not present

## 2021-01-08 DIAGNOSIS — Z923 Personal history of irradiation: Secondary | ICD-10-CM | POA: Diagnosis not present

## 2021-01-08 DIAGNOSIS — C50012 Malignant neoplasm of nipple and areola, left female breast: Secondary | ICD-10-CM | POA: Diagnosis not present

## 2021-02-05 DIAGNOSIS — C50012 Malignant neoplasm of nipple and areola, left female breast: Secondary | ICD-10-CM | POA: Diagnosis not present

## 2021-02-05 DIAGNOSIS — R922 Inconclusive mammogram: Secondary | ICD-10-CM | POA: Diagnosis not present

## 2021-02-05 DIAGNOSIS — Z79811 Long term (current) use of aromatase inhibitors: Secondary | ICD-10-CM | POA: Diagnosis not present

## 2021-02-05 DIAGNOSIS — Z853 Personal history of malignant neoplasm of breast: Secondary | ICD-10-CM | POA: Diagnosis not present

## 2021-02-05 DIAGNOSIS — Z9889 Other specified postprocedural states: Secondary | ICD-10-CM | POA: Diagnosis not present

## 2021-02-05 DIAGNOSIS — Z170 Estrogen receptor positive status [ER+]: Secondary | ICD-10-CM | POA: Diagnosis not present

## 2021-05-05 DIAGNOSIS — E559 Vitamin D deficiency, unspecified: Secondary | ICD-10-CM | POA: Diagnosis not present

## 2021-05-05 DIAGNOSIS — C50912 Malignant neoplasm of unspecified site of left female breast: Secondary | ICD-10-CM | POA: Diagnosis not present

## 2021-05-05 DIAGNOSIS — C50012 Malignant neoplasm of nipple and areola, left female breast: Secondary | ICD-10-CM | POA: Diagnosis not present

## 2021-05-05 DIAGNOSIS — D508 Other iron deficiency anemias: Secondary | ICD-10-CM | POA: Diagnosis not present

## 2021-05-05 DIAGNOSIS — Z170 Estrogen receptor positive status [ER+]: Secondary | ICD-10-CM | POA: Diagnosis not present

## 2021-05-12 DIAGNOSIS — C50012 Malignant neoplasm of nipple and areola, left female breast: Secondary | ICD-10-CM | POA: Diagnosis not present

## 2021-05-12 DIAGNOSIS — D649 Anemia, unspecified: Secondary | ICD-10-CM | POA: Diagnosis not present

## 2021-05-12 DIAGNOSIS — H401431 Capsular glaucoma with pseudoexfoliation of lens, bilateral, mild stage: Secondary | ICD-10-CM | POA: Diagnosis not present

## 2021-05-12 DIAGNOSIS — Z170 Estrogen receptor positive status [ER+]: Secondary | ICD-10-CM | POA: Diagnosis not present

## 2021-05-12 DIAGNOSIS — M8589 Other specified disorders of bone density and structure, multiple sites: Secondary | ICD-10-CM | POA: Diagnosis not present

## 2021-05-12 DIAGNOSIS — H40053 Ocular hypertension, bilateral: Secondary | ICD-10-CM | POA: Diagnosis not present

## 2021-05-20 DIAGNOSIS — I10 Essential (primary) hypertension: Secondary | ICD-10-CM | POA: Diagnosis not present

## 2021-05-20 DIAGNOSIS — Z299 Encounter for prophylactic measures, unspecified: Secondary | ICD-10-CM | POA: Diagnosis not present

## 2021-05-20 DIAGNOSIS — Z789 Other specified health status: Secondary | ICD-10-CM | POA: Diagnosis not present

## 2021-06-05 DIAGNOSIS — Z299 Encounter for prophylactic measures, unspecified: Secondary | ICD-10-CM | POA: Diagnosis not present

## 2021-06-05 DIAGNOSIS — I10 Essential (primary) hypertension: Secondary | ICD-10-CM | POA: Diagnosis not present

## 2021-06-05 DIAGNOSIS — C50919 Malignant neoplasm of unspecified site of unspecified female breast: Secondary | ICD-10-CM | POA: Diagnosis not present

## 2021-06-05 DIAGNOSIS — J329 Chronic sinusitis, unspecified: Secondary | ICD-10-CM | POA: Diagnosis not present

## 2021-06-05 DIAGNOSIS — I700 Atherosclerosis of aorta: Secondary | ICD-10-CM | POA: Diagnosis not present

## 2021-07-11 DIAGNOSIS — Z713 Dietary counseling and surveillance: Secondary | ICD-10-CM | POA: Diagnosis not present

## 2021-07-11 DIAGNOSIS — I10 Essential (primary) hypertension: Secondary | ICD-10-CM | POA: Diagnosis not present

## 2021-07-11 DIAGNOSIS — Z299 Encounter for prophylactic measures, unspecified: Secondary | ICD-10-CM | POA: Diagnosis not present

## 2021-07-11 DIAGNOSIS — Z23 Encounter for immunization: Secondary | ICD-10-CM | POA: Diagnosis not present

## 2021-07-15 DIAGNOSIS — Z853 Personal history of malignant neoplasm of breast: Secondary | ICD-10-CM | POA: Diagnosis not present

## 2021-08-07 DIAGNOSIS — Z170 Estrogen receptor positive status [ER+]: Secondary | ICD-10-CM | POA: Diagnosis not present

## 2021-08-07 DIAGNOSIS — M8589 Other specified disorders of bone density and structure, multiple sites: Secondary | ICD-10-CM | POA: Diagnosis not present

## 2021-08-07 DIAGNOSIS — C50012 Malignant neoplasm of nipple and areola, left female breast: Secondary | ICD-10-CM | POA: Diagnosis not present

## 2021-08-11 DIAGNOSIS — H18413 Arcus senilis, bilateral: Secondary | ICD-10-CM | POA: Diagnosis not present

## 2021-08-11 DIAGNOSIS — H40053 Ocular hypertension, bilateral: Secondary | ICD-10-CM | POA: Diagnosis not present

## 2021-08-11 DIAGNOSIS — H01002 Unspecified blepharitis right lower eyelid: Secondary | ICD-10-CM | POA: Diagnosis not present

## 2021-08-11 DIAGNOSIS — H01005 Unspecified blepharitis left lower eyelid: Secondary | ICD-10-CM | POA: Diagnosis not present

## 2021-08-11 DIAGNOSIS — H401431 Capsular glaucoma with pseudoexfoliation of lens, bilateral, mild stage: Secondary | ICD-10-CM | POA: Diagnosis not present

## 2021-08-11 DIAGNOSIS — H01001 Unspecified blepharitis right upper eyelid: Secondary | ICD-10-CM | POA: Diagnosis not present

## 2021-08-11 DIAGNOSIS — H01004 Unspecified blepharitis left upper eyelid: Secondary | ICD-10-CM | POA: Diagnosis not present

## 2021-08-19 DIAGNOSIS — J029 Acute pharyngitis, unspecified: Secondary | ICD-10-CM | POA: Diagnosis not present

## 2021-08-19 DIAGNOSIS — Z299 Encounter for prophylactic measures, unspecified: Secondary | ICD-10-CM | POA: Diagnosis not present

## 2021-08-19 DIAGNOSIS — I10 Essential (primary) hypertension: Secondary | ICD-10-CM | POA: Diagnosis not present

## 2021-08-19 DIAGNOSIS — M7061 Trochanteric bursitis, right hip: Secondary | ICD-10-CM | POA: Diagnosis not present

## 2021-08-26 DIAGNOSIS — C50012 Malignant neoplasm of nipple and areola, left female breast: Secondary | ICD-10-CM | POA: Diagnosis not present

## 2021-08-26 DIAGNOSIS — M8589 Other specified disorders of bone density and structure, multiple sites: Secondary | ICD-10-CM | POA: Diagnosis not present

## 2021-08-26 DIAGNOSIS — Z1231 Encounter for screening mammogram for malignant neoplasm of breast: Secondary | ICD-10-CM | POA: Diagnosis not present

## 2021-08-26 DIAGNOSIS — Z170 Estrogen receptor positive status [ER+]: Secondary | ICD-10-CM | POA: Diagnosis not present

## 2021-11-12 DIAGNOSIS — R7989 Other specified abnormal findings of blood chemistry: Secondary | ICD-10-CM | POA: Diagnosis not present

## 2021-11-12 DIAGNOSIS — Z299 Encounter for prophylactic measures, unspecified: Secondary | ICD-10-CM | POA: Diagnosis not present

## 2021-11-12 DIAGNOSIS — I10 Essential (primary) hypertension: Secondary | ICD-10-CM | POA: Diagnosis not present

## 2021-11-12 DIAGNOSIS — J019 Acute sinusitis, unspecified: Secondary | ICD-10-CM | POA: Diagnosis not present

## 2021-12-15 DIAGNOSIS — H18413 Arcus senilis, bilateral: Secondary | ICD-10-CM | POA: Diagnosis not present

## 2021-12-15 DIAGNOSIS — H401431 Capsular glaucoma with pseudoexfoliation of lens, bilateral, mild stage: Secondary | ICD-10-CM | POA: Diagnosis not present

## 2021-12-15 DIAGNOSIS — H01001 Unspecified blepharitis right upper eyelid: Secondary | ICD-10-CM | POA: Diagnosis not present

## 2021-12-15 DIAGNOSIS — H40053 Ocular hypertension, bilateral: Secondary | ICD-10-CM | POA: Diagnosis not present

## 2022-01-22 DIAGNOSIS — E210 Primary hyperparathyroidism: Secondary | ICD-10-CM | POA: Diagnosis not present

## 2022-01-22 DIAGNOSIS — Z79899 Other long term (current) drug therapy: Secondary | ICD-10-CM | POA: Diagnosis not present

## 2022-01-22 DIAGNOSIS — I10 Essential (primary) hypertension: Secondary | ICD-10-CM | POA: Diagnosis not present

## 2022-01-22 DIAGNOSIS — E559 Vitamin D deficiency, unspecified: Secondary | ICD-10-CM | POA: Diagnosis not present

## 2022-01-22 DIAGNOSIS — Z789 Other specified health status: Secondary | ICD-10-CM | POA: Diagnosis not present

## 2022-01-22 DIAGNOSIS — R5383 Other fatigue: Secondary | ICD-10-CM | POA: Diagnosis not present

## 2022-01-22 DIAGNOSIS — Z299 Encounter for prophylactic measures, unspecified: Secondary | ICD-10-CM | POA: Diagnosis not present

## 2022-01-22 DIAGNOSIS — Z0000 Encounter for general adult medical examination without abnormal findings: Secondary | ICD-10-CM | POA: Diagnosis not present

## 2022-02-11 DIAGNOSIS — Z853 Personal history of malignant neoplasm of breast: Secondary | ICD-10-CM | POA: Diagnosis not present

## 2022-02-11 DIAGNOSIS — R928 Other abnormal and inconclusive findings on diagnostic imaging of breast: Secondary | ICD-10-CM | POA: Diagnosis not present

## 2022-02-11 DIAGNOSIS — R922 Inconclusive mammogram: Secondary | ICD-10-CM | POA: Diagnosis not present

## 2022-02-16 DIAGNOSIS — I158 Other secondary hypertension: Secondary | ICD-10-CM | POA: Diagnosis not present

## 2022-02-16 DIAGNOSIS — E559 Vitamin D deficiency, unspecified: Secondary | ICD-10-CM | POA: Diagnosis not present

## 2022-02-16 DIAGNOSIS — Z170 Estrogen receptor positive status [ER+]: Secondary | ICD-10-CM | POA: Diagnosis not present

## 2022-02-16 DIAGNOSIS — D508 Other iron deficiency anemias: Secondary | ICD-10-CM | POA: Diagnosis not present

## 2022-02-16 DIAGNOSIS — Z7189 Other specified counseling: Secondary | ICD-10-CM | POA: Diagnosis not present

## 2022-02-16 DIAGNOSIS — C50012 Malignant neoplasm of nipple and areola, left female breast: Secondary | ICD-10-CM | POA: Diagnosis not present

## 2022-02-24 DIAGNOSIS — Z170 Estrogen receptor positive status [ER+]: Secondary | ICD-10-CM | POA: Diagnosis not present

## 2022-02-24 DIAGNOSIS — C50012 Malignant neoplasm of nipple and areola, left female breast: Secondary | ICD-10-CM | POA: Diagnosis not present

## 2022-02-24 DIAGNOSIS — Z79811 Long term (current) use of aromatase inhibitors: Secondary | ICD-10-CM | POA: Diagnosis not present

## 2022-04-05 ENCOUNTER — Other Ambulatory Visit: Payer: Self-pay

## 2022-04-05 ENCOUNTER — Inpatient Hospital Stay (HOSPITAL_COMMUNITY)
Admission: EM | Admit: 2022-04-05 | Discharge: 2022-04-13 | DRG: 699 | Disposition: A | Discharge status: Home Health | Payer: Medicare Other | Attending: Internal Medicine | Admitting: Internal Medicine

## 2022-04-05 ENCOUNTER — Encounter (HOSPITAL_COMMUNITY): Payer: Self-pay | Admitting: *Deleted

## 2022-04-05 ENCOUNTER — Emergency Department (HOSPITAL_COMMUNITY): Payer: Medicare Other

## 2022-04-05 DIAGNOSIS — R531 Weakness: Secondary | ICD-10-CM | POA: Diagnosis not present

## 2022-04-05 DIAGNOSIS — N1832 Chronic kidney disease, stage 3b: Secondary | ICD-10-CM | POA: Diagnosis not present

## 2022-04-05 DIAGNOSIS — Z853 Personal history of malignant neoplasm of breast: Secondary | ICD-10-CM

## 2022-04-05 DIAGNOSIS — Z923 Personal history of irradiation: Secondary | ICD-10-CM

## 2022-04-05 DIAGNOSIS — C50411 Malignant neoplasm of upper-outer quadrant of right female breast: Secondary | ICD-10-CM | POA: Diagnosis not present

## 2022-04-05 DIAGNOSIS — Z7982 Long term (current) use of aspirin: Secondary | ICD-10-CM

## 2022-04-05 DIAGNOSIS — E213 Hyperparathyroidism, unspecified: Secondary | ICD-10-CM | POA: Diagnosis present

## 2022-04-05 DIAGNOSIS — K219 Gastro-esophageal reflux disease without esophagitis: Secondary | ICD-10-CM | POA: Diagnosis not present

## 2022-04-05 DIAGNOSIS — C50412 Malignant neoplasm of upper-outer quadrant of left female breast: Secondary | ICD-10-CM | POA: Diagnosis not present

## 2022-04-05 DIAGNOSIS — Z888 Allergy status to other drugs, medicaments and biological substances status: Secondary | ICD-10-CM

## 2022-04-05 DIAGNOSIS — I824Z2 Acute embolism and thrombosis of unspecified deep veins of left distal lower extremity: Secondary | ICD-10-CM | POA: Diagnosis not present

## 2022-04-05 DIAGNOSIS — R0600 Dyspnea, unspecified: Secondary | ICD-10-CM | POA: Diagnosis not present

## 2022-04-05 DIAGNOSIS — Z803 Family history of malignant neoplasm of breast: Secondary | ICD-10-CM | POA: Diagnosis not present

## 2022-04-05 DIAGNOSIS — I82442 Acute embolism and thrombosis of left tibial vein: Secondary | ICD-10-CM | POA: Diagnosis present

## 2022-04-05 DIAGNOSIS — Z96653 Presence of artificial knee joint, bilateral: Secondary | ICD-10-CM | POA: Diagnosis present

## 2022-04-05 DIAGNOSIS — F419 Anxiety disorder, unspecified: Secondary | ICD-10-CM | POA: Diagnosis present

## 2022-04-05 DIAGNOSIS — I776 Arteritis, unspecified: Secondary | ICD-10-CM | POA: Diagnosis not present

## 2022-04-05 DIAGNOSIS — Z170 Estrogen receptor positive status [ER+]: Secondary | ICD-10-CM | POA: Diagnosis not present

## 2022-04-05 DIAGNOSIS — Z87891 Personal history of nicotine dependence: Secondary | ICD-10-CM | POA: Diagnosis not present

## 2022-04-05 DIAGNOSIS — E559 Vitamin D deficiency, unspecified: Secondary | ICD-10-CM | POA: Diagnosis present

## 2022-04-05 DIAGNOSIS — I10 Essential (primary) hypertension: Secondary | ICD-10-CM | POA: Diagnosis present

## 2022-04-05 DIAGNOSIS — N009 Acute nephritic syndrome with unspecified morphologic changes: Secondary | ICD-10-CM

## 2022-04-05 DIAGNOSIS — Z882 Allergy status to sulfonamides status: Secondary | ICD-10-CM | POA: Diagnosis not present

## 2022-04-05 DIAGNOSIS — N179 Acute kidney failure, unspecified: Secondary | ICD-10-CM | POA: Diagnosis present

## 2022-04-05 DIAGNOSIS — D509 Iron deficiency anemia, unspecified: Secondary | ICD-10-CM | POA: Diagnosis present

## 2022-04-05 DIAGNOSIS — I82402 Acute embolism and thrombosis of unspecified deep veins of left lower extremity: Secondary | ICD-10-CM | POA: Diagnosis present

## 2022-04-05 DIAGNOSIS — Z96641 Presence of right artificial hip joint: Secondary | ICD-10-CM | POA: Diagnosis present

## 2022-04-05 DIAGNOSIS — R32 Unspecified urinary incontinence: Secondary | ICD-10-CM | POA: Diagnosis present

## 2022-04-05 DIAGNOSIS — R0602 Shortness of breath: Secondary | ICD-10-CM | POA: Diagnosis not present

## 2022-04-05 DIAGNOSIS — N008 Acute nephritic syndrome with other morphologic changes: Principal | ICD-10-CM | POA: Diagnosis present

## 2022-04-05 DIAGNOSIS — Z79899 Other long term (current) drug therapy: Secondary | ICD-10-CM

## 2022-04-05 DIAGNOSIS — R079 Chest pain, unspecified: Secondary | ICD-10-CM | POA: Diagnosis not present

## 2022-04-05 DIAGNOSIS — R0609 Other forms of dyspnea: Secondary | ICD-10-CM | POA: Diagnosis present

## 2022-04-05 DIAGNOSIS — Z6839 Body mass index (BMI) 39.0-39.9, adult: Secondary | ICD-10-CM

## 2022-04-05 DIAGNOSIS — E669 Obesity, unspecified: Secondary | ICD-10-CM | POA: Diagnosis present

## 2022-04-05 DIAGNOSIS — I824Z1 Acute embolism and thrombosis of unspecified deep veins of right distal lower extremity: Secondary | ICD-10-CM | POA: Diagnosis not present

## 2022-04-05 DIAGNOSIS — C50419 Malignant neoplasm of upper-outer quadrant of unspecified female breast: Secondary | ICD-10-CM | POA: Diagnosis present

## 2022-04-05 DIAGNOSIS — E210 Primary hyperparathyroidism: Secondary | ICD-10-CM | POA: Diagnosis not present

## 2022-04-05 LAB — BASIC METABOLIC PANEL
Anion gap: 7 (ref 5–15)
BUN: 40 mg/dL — ABNORMAL HIGH (ref 8–23)
CO2: 26 mmol/L (ref 22–32)
Calcium: 9.9 mg/dL (ref 8.9–10.3)
Chloride: 106 mmol/L (ref 98–111)
Creatinine, Ser: 3.16 mg/dL — ABNORMAL HIGH (ref 0.44–1.00)
GFR, Estimated: 14 mL/min — ABNORMAL LOW (ref >60)
Glucose, Bld: 103 mg/dL — ABNORMAL HIGH (ref 70–99)
Potassium: 4 mmol/L (ref 3.5–5.1)
Sodium: 139 mmol/L (ref 135–145)

## 2022-04-05 LAB — URINALYSIS, ROUTINE W REFLEX MICROSCOPIC
Bilirubin Urine: NEGATIVE
Glucose, UA: NEGATIVE mg/dL
Ketones, ur: NEGATIVE mg/dL
Leukocytes,Ua: NEGATIVE
Nitrite: NEGATIVE
Protein, ur: 100 mg/dL — AB
RBC / HPF: >50 RBC/hpf — ABNORMAL HIGH (ref 0–5)
Specific Gravity, Urine: 1.008 (ref 1.005–1.030)
pH: 8 (ref 5.0–8.0)

## 2022-04-05 LAB — CBC
HCT: 28.9 % — ABNORMAL LOW (ref 36.0–46.0)
Hemoglobin: 9.3 g/dL — ABNORMAL LOW (ref 12.0–15.0)
MCH: 29.3 pg (ref 26.0–34.0)
MCHC: 32.2 g/dL (ref 30.0–36.0)
MCV: 91.2 fL (ref 80.0–100.0)
Platelets: 278 10*3/uL (ref 150–400)
RBC: 3.17 MIL/uL — ABNORMAL LOW (ref 3.87–5.11)
RDW: 14.1 % (ref 11.5–15.5)
WBC: 5.6 10*3/uL (ref 4.0–10.5)
nRBC: 0 % (ref 0.0–0.2)

## 2022-04-05 LAB — BRAIN NATRIURETIC PEPTIDE: B Natriuretic Peptide: 73 pg/mL (ref 0.0–100.0)

## 2022-04-05 MED ORDER — HYDRALAZINE HCL 25 MG PO TABS
100.0000 mg | ORAL_TABLET | Freq: Three times a day (TID) | ORAL | Status: DC
  Administered 2022-04-05 – 2022-04-08 (×8): 100 mg via ORAL
  Filled 2022-04-05 (×8): qty 4

## 2022-04-05 MED ORDER — SODIUM CHLORIDE 0.9 % IV BOLUS
1000.0000 mL | Freq: Once | INTRAVENOUS | Status: AC
  Administered 2022-04-05: 1000 mL via INTRAVENOUS

## 2022-04-05 MED ORDER — POLYETHYLENE GLYCOL 3350 17 G PO PACK
17.0000 g | PACK | Freq: Every day | ORAL | Status: DC | PRN
  Administered 2022-04-11: 17 g via ORAL
  Filled 2022-04-05: qty 1

## 2022-04-05 MED ORDER — SODIUM CHLORIDE 0.9 % IV SOLN: INTRAVENOUS | Status: DC

## 2022-04-05 MED ORDER — ONDANSETRON HCL 4 MG PO TABS: 4.0000 mg | ORAL_TABLET | Freq: Four times a day (QID) | ORAL | Status: DC | PRN

## 2022-04-05 MED ORDER — ACETAMINOPHEN 325 MG PO TABS: 650.0000 mg | ORAL_TABLET | Freq: Four times a day (QID) | ORAL | Status: DC | PRN

## 2022-04-05 MED ORDER — ACETAMINOPHEN 650 MG RE SUPP: 650.0000 mg | Freq: Four times a day (QID) | RECTAL | Status: DC | PRN

## 2022-04-05 MED ORDER — ONDANSETRON HCL 4 MG/2ML IJ SOLN: 4.0000 mg | Freq: Four times a day (QID) | INTRAMUSCULAR | Status: DC | PRN

## 2022-04-05 MED ORDER — HEPARIN SODIUM (PORCINE) 5000 UNIT/ML IJ SOLN
5000.0000 [IU] | Freq: Three times a day (TID) | INTRAMUSCULAR | Status: DC
  Administered 2022-04-05 – 2022-04-06 (×2): 5000 [IU] via SUBCUTANEOUS
  Filled 2022-04-05 (×2): qty 1

## 2022-04-05 NOTE — H&P (Signed)
History and Physical    [name] [name] WEX:[id number] DOB: 17-Jan-1941 DOA: 04/05/2022  PCP: Monico Blitz, MD   Patient coming from: Home  I have personally briefly reviewed patient's old medical records in [place]  Chief Complaint: Difficulty breathing  HPI: [name] [name] is a 81 y.o. female with medical history significant for breast cancer, anemia, hyperparathyroidism, hypertension. Patient presented to the ED with intermittent difficulty breathing over the past 2 weeks.  Symptoms are mostly with exertion.  She denies cough.  No chest pain.  She reports swelling of her lower extremities today.  She feels her abdomen is bloated.  Checks her weight regularly, she reports there has been no change. She denies vomiting, no loose stools.  She has maintained good oral intake.  Not on any diuretics.  ED Course:  Temp 97.6.  Heart rate 70s to 80s.  Respiratory rate 13-24.  Blood pressure systolic 938B to 017P. Creatinine elevated at 3.16, BNP 73.  Chest x-ray without acute abnormality. 1 L bolus given.  Renal ultrasound ordered.  Review of Systems: As per HPI all other systems reviewed and negative.  Past Medical History:  Diagnosis Date   Anemia    Anxiety    Arthritis    back, shoulders ,  right hip   Breast cancer, left [facility]) oncologist-- dr Audelia Hives ([facility] in [place])--- per lov in epic no recurrence   dx 05/ 2011---- Stage I (T1,N0M0), DCIS, ER+;  s/p  left breast lumpectomy ,  completed radiation therapy 06-02-2010,  started antiestrogen   Full dentures    GERD (gastroesophageal reflux disease)    History of colon polyps 2009   Dr. Collene Mares   History of external beam radiation therapy 04-21-2010  to 06-02-2010   left breast cancer   Hyperparathyroidism ([place])    per pt dx 03/ 2019,  was told by surgeon not high enough to worry about   Hypertension    Vitamin D deficiency    Wears glasses     Past Surgical History:  Procedure Laterality Date   APPLICATION  OF WOUND VAC Right 10/06/2018   Procedure: APPLICATION OF WOUND VAC;  Surgeon: Rod Can, MD;  Location: [facility];  Service: Orthopedics;  Laterality: Right;   BREAST LUMPECTOMY Left 2011   BREAST LUMPECTOMY Left 05/02/2020   COLONOSCOPY N/A 05/18/2013   Procedure: COLONOSCOPY;  Surgeon: Daneil Dolin, MD;  Location: [facility];  Service: Endoscopy;  Laterality: N/A;  9:30   DILATATION & CURETTAGE/HYSTEROSCOPY WITH MYOSURE N/A 05/28/2016   Procedure: DILATATION & CURETTAGE/HYSTEROSCOPY WITH MYOSURE;  Surgeon: Servando Salina, MD;  Location: [facility];  Service: Gynecology;  Laterality: N/A;   INCISION AND DRAINAGE HIP Right 10/06/2018   Procedure: DEBRIDEMENT AND CLOSURE RIGHT HIP;  Surgeon: Rod Can, MD;  Location: [facility];  Service: Orthopedics;  Laterality: Right;   TOTAL HIP ARTHROPLASTY Right 09/15/2018   Procedure: TOTAL HIP ARTHROPLASTY ANTERIOR APPROACH;  Surgeon: Rod Can, MD;  Location: [facility];  Service: Orthopedics;  Laterality: Right;   TOTAL KNEE ARTHROPLASTY Bilateral left 05-07-2009;  right 02-07-2010   both by dr Theda Sers '@[email or web address]'$      reports that she quit smoking about 47 years ago. Her smoking use included cigarettes. She has never used smokeless tobacco. She reports that she does not drink alcohol and does not use drugs.  Allergies  Allergen Reactions   Ace Inhibitors Swelling   Sulfa Antibiotics Swelling and Rash    Family History  Problem Relation Age of  Onset   Breast cancer Mother    Colon cancer Neg Hx    Lung cancer Neg Hx    Ovarian cancer Neg Hx     Prior to Admission medications   Medication Sig Start Date End Date Taking? Authorizing Provider  amLODipine (NORVASC) 5 MG tablet Take 10 mg by mouth at bedtime.    [provider]  aspirin 81 MG chewable tablet Chew 1 tablet (81 mg total) by mouth 2 (two) times daily. Patient not taking: Reported on 06/05/2020 09/16/18   Rod Can, MD  cetirizine (ZYRTEC) 10 MG tablet Take 10 mg  by mouth every morning.     [provider]  Cholecalciferol 25 MCG (1000 UT) tablet Take by mouth.    [provider]  COMBIGAN 0.2-0.5 % ophthalmic solution SMARTSIG:1 Drop(s) In Eye(s) Every 12 Hours 05/22/20   [provider]  docusate sodium (COLACE) 100 MG capsule Take 1 capsule (100 mg total) by mouth 2 (two) times daily. Patient not taking: Reported on 06/05/2020 09/16/18   Rod Can, MD  famotidine (PEPCID) 10 MG tablet Take 10 mg by mouth 2 (two) times daily.    [provider]  hydrALAZINE (APRESOLINE) 50 MG tablet Take 50 mg by mouth 2 (two) times daily. 05/29/20   [provider]  HYDROcodone-acetaminophen (NORCO/VICODIN) 5-325 MG tablet Take 1-2 tablets by mouth every 6 (six) hours as needed for moderate pain. Patient not taking: Reported on 06/05/2020 09/16/18   Rod Can, MD  latanoprost (XALATAN) 0.005 % ophthalmic solution Place 1 drop into both eyes at bedtime.    [provider]  ondansetron (ZOFRAN) 4 MG tablet Take 1 tablet (4 mg total) by mouth every 6 (six) hours as needed for nausea. Patient not taking: Reported on 06/05/2020 09/16/18   Rod Can, MD  ranitidine (ZANTAC) 150 MG tablet Take 150 mg by mouth daily as needed for heartburn.  Patient not taking: Reported on 06/05/2020    [provider]  senna (SENOKOT) 8.6 MG TABS tablet Take 1 tablet (8.6 mg total) by mouth 2 (two) times daily. Patient not taking: Reported on 06/05/2020 09/16/18   Rod Can, MD    Physical Exam: Vitals:   04/05/22 1422 04/05/22 1430 04/05/22 1605 04/05/22 1630  BP: (!) 173/65 (!) 167/59 (!) 186/68 (!) 168/63  Pulse: 72 73 82 70  Resp: 14 20 (!) 21 13  Temp:      TempSrc:      SpO2: 93% 93% 98% 91%  Weight:      Height:        Constitutional: NAD, calm, comfortable Vitals:   04/05/22 1422 04/05/22 1430 04/05/22 1605 04/05/22 1630  BP: (!) 173/65 (!) 167/59 (!) 186/68 (!) 168/63  Pulse: 72 73 82 70   Resp: 14 20 (!) 21 13  Temp:      TempSrc:      SpO2: 93% 93% 98% 91%  Weight:      Height:       Eyes: PERRL, lids and conjunctivae normal ENMT: Mucous membranes are Mildly dry  Neck: normal, supple, no masses, no thyromegaly Respiratory: clear to auscultation bilaterally, no wheezing, no crackles. Normal respiratory effort. No accessory muscle use.  Cardiovascular: Regular rate and rhythm, 3/6 systolic murmurs- patient denies hx of murmurs,, no rubs, no gallops. Trace bilat pitting extremity edema.  Lower extremities warm.  Abdomen: no tenderness, no masses palpated. No hepatosplenomegaly. Bowel sounds positive.  Musculoskeletal: no clubbing / cyanosis. No joint deformity upper and  lower extremities.  Skin: no rashes, lesions, ulcers. No induration Neurologic: No apparent cranial nerve abnormality moving extremities spontaneously. Psychiatric: Normal judgment and insight. Alert and oriented x 3. Normal mood.   Labs on Admission: I have personally reviewed following labs and imaging studies  CBC: Recent Labs  Lab 04/05/22 1317  WBC 5.6  HGB 9.3*  HCT 28.9*  MCV 91.2  PLT 081   Basic Metabolic Panel: Recent Labs  Lab 04/05/22 1317  NA 139  K 4.0  CL 106  CO2 26  GLUCOSE 103*  BUN 40*  CREATININE 3.16*  CALCIUM 9.9    Radiological Exams on Admission: DG Chest 2 View  Result Date: 04/05/2022 CLINICAL DATA:  Pt c/o sob and weakness for several days. Denies cp, fever or n/v. HX of hypertension and CA. EXAM: CHEST - 2 VIEW COMPARISON:  01/08/2020. FINDINGS: Mild enlargement of the cardiopericardial silhouette. No mediastinal or hilar masses. No evidence of adenopathy. Clear lungs.  No pleural effusion or pneumothorax. Skeletal structures are intact. IMPRESSION: No acute cardiopulmonary disease. Electronically Signed   By: Lajean Manes M.D.   On: 04/05/2022 14:04    EKG: Independently reviewed.  Sinus rhythm rate 77, QTc 432.  No significant change from  prior.  Assessment/Plan Principal Problem:   AKI (acute kidney injury) ([place]) Active Problems:   Anemia, iron deficiency   Malignant neoplasm of upper outer quadrant of female breast ([place])   HTN (hypertension)   Dyspnea   Assessment and Plan: * AKI (acute kidney injury) ([place]) Cr-3.16, per Care Everywhere last check 4/24 creatinine was 1.9.  No history of GI losses, she has maintained good oral intake.  She has trace bilateral pitting lower extremity edema which is new, otherwise no other sign of volume overload. -No apparent nephrotoxics on patient's home med list - UA Showing moderate hemoglobin, 100 protein. -Trial of IV fluids, 1 L bolus given, continue N/s 100cc.hr x 15hrs -If no improvement in renal function in a.m., will need nephrology evaluation -Renal ultrasound   Dyspnea Dyspnea with exertion.  No objective evidence of dyspnea.  O2 sats 91 to 96% on room air.  Chest x-ray clear.  Has trace bilateral lower extremity edema. -Bilateral lower extremity venous Dopplers -Further work-up pending clinical course  HTN (hypertension) Systolic 448J to 856D. -Resume hydralazine, Norvasc  Malignant neoplasm of upper outer quadrant of female breast ([place]) Status postlumpectomy, radiation treatment, currently on anastrozole. - Resume Anastrozole    DVT prophylaxis: Heparin Code Status:  Full code Family Communication: Daughter Felicia at bedside Disposition Plan:  ~ 2 days Consults called: None, will need nephrology evaluation if no improvement in renal function Admission status:  Obs tele   Author: Bethena Roys, MD 04/05/2022 8:04 PM  For on call review [email or web address].

## 2022-04-05 NOTE — Assessment & Plan Note (Addendum)
-  Systolic 689N to 406E at time of admission. 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

## 2022-04-05 NOTE — Assessment & Plan Note (Addendum)
-  Dyspnea with exertion. -suspect symptomatic anemia -1 unit PRBCs transfused >>dyspnea improved; stable on RA -04/06/22 echo G1DD LV with hyperdynamic function, no wall motion abnormalities, ejection fraction 70 to 75%.  No significant valvular disorder.   -Lower extremity Doppler with positive left leg DVT; therapy with anticoagulation will be initiated.    -overall improved.  Stable on RA

## 2022-04-05 NOTE — Assessment & Plan Note (Signed)
Status postlumpectomy, radiation treatment, currently on anastrozole. - Resume Anastrozole

## 2022-04-05 NOTE — ED Triage Notes (Signed)
Pt with SOB off and on "for awhile", states worse this morning. Cough occasionally.  Denies any pain.

## 2022-04-05 NOTE — Assessment & Plan Note (Addendum)
-  Cr-3.16, per Care Everywhere last check 4/24 creatinine was 1.9.  -positive autoantibodies suggest hydralazine induced GN -appreciate renal  -ANA and anti-dsDNA positive -PANCA positive -anti-histone antibody positive -SPEP neg M-spike -renal US without hydronephrosis. 04/09/22--planning renal biopsy -antiGBM, C3, C4 neg -empiric prednisone started 6/14 -serum creatinine gradually improving--2.43 on day of d/c 6/19--nephrology cleared pt for d/c;  Repeat renal panel and CBC on 6/22--pt given instructions to come to [facility] lab

## 2022-04-05 NOTE — ED Provider Notes (Signed)
[facility] EMERGENCY DEPARTMENT Provider Note   CSN: [id number] Arrival date & time: 04/05/22  1141     History  Chief Complaint  Patient presents with  . Shortness of Breath    [name] [name] is a 81 y.o. female with past medical history significant for hypertension, acid reflux, with breast cancer twice she is not currently being treated for breast cancer who presents with shortness of breath on and off for around 2 weeks.  Patient reports that she feels like she is keeping some fluid on, has endorsed some increased swelling of the legs.  She denies any chest pain associated with her shortness of breath.  She reports that she has had a nagging cough for a while associated with her acid reflux, and had a runny nose a few days ago which is since resolved.  She denies any persistent cough, sore throat, congestion, fever, chills.  She denies any recent sick contacts.  She denies any smoking.  No previous history of heart failure or kidney failure.   Shortness of Breath      Home Medications Prior to Admission medications   Medication Sig Start Date End Date Taking? Authorizing Provider  amLODipine (NORVASC) 5 MG tablet Take 10 mg by mouth at bedtime.    [provider]  aspirin 81 MG chewable tablet Chew 1 tablet (81 mg total) by mouth 2 (two) times daily. Patient not taking: Reported on 06/05/2020 09/16/18   Rod Can, MD  cetirizine (ZYRTEC) 10 MG tablet Take 10 mg by mouth every morning.     [provider]  Cholecalciferol 25 MCG (1000 UT) tablet Take by mouth.    [provider]  COMBIGAN 0.2-0.5 % ophthalmic solution SMARTSIG:1 Drop(s) In Eye(s) Every 12 Hours 05/22/20   [provider]  docusate sodium (COLACE) 100 MG capsule Take 1 capsule (100 mg total) by mouth 2 (two) times daily. Patient not taking: Reported on 06/05/2020 09/16/18   Rod Can, MD  famotidine (PEPCID) 10 MG tablet Take 10 mg by mouth 2 (two) times daily.     [provider]  hydrALAZINE (APRESOLINE) 50 MG tablet Take 50 mg by mouth 2 (two) times daily. 05/29/20   [provider]  HYDROcodone-acetaminophen (NORCO/VICODIN) 5-325 MG tablet Take 1-2 tablets by mouth every 6 (six) hours as needed for moderate pain. Patient not taking: Reported on 06/05/2020 09/16/18   Rod Can, MD  latanoprost (XALATAN) 0.005 % ophthalmic solution Place 1 drop into both eyes at bedtime.    [provider]  ondansetron (ZOFRAN) 4 MG tablet Take 1 tablet (4 mg total) by mouth every 6 (six) hours as needed for nausea. Patient not taking: Reported on 06/05/2020 09/16/18   Rod Can, MD  ranitidine (ZANTAC) 150 MG tablet Take 150 mg by mouth daily as needed for heartburn.  Patient not taking: Reported on 06/05/2020    [provider]  senna (SENOKOT) 8.6 MG TABS tablet Take 1 tablet (8.6 mg total) by mouth 2 (two) times daily. Patient not taking: Reported on 06/05/2020 09/16/18   Rod Can, MD      Allergies    Ace inhibitors and Sulfa antibiotics    Review of Systems   Review of Systems  Respiratory:  Positive for shortness of breath.   All other systems reviewed and are negative.   Physical Exam Updated Vital Signs BP (!) 168/63   Pulse 70   Temp 97.6 F (36.4 C) (Oral)   Resp 13   Ht  $'5\' 5"'L$  (1.651 m)   Wt 106.6 kg   SpO2 91%   BMI 39.11 kg/m  Physical Exam Vitals and nursing note reviewed.  Constitutional:      General: She is not in acute distress.    Appearance: Normal appearance.  HENT:     Head: Normocephalic and atraumatic.  Eyes:     General:        Right eye: No discharge.        Left eye: No discharge.  Cardiovascular:     Rate and Rhythm: Normal rate and regular rhythm.     Heart sounds: No murmur heard.    No friction rub. No gallop.  Pulmonary:     Effort: Pulmonary effort is normal.     Breath sounds: Normal breath sounds.     Comments: Minimal bibasilar crackles noted, patient in no  respiratory distress, no focal consolidation noted, no wheezing, rhonchi, rales. Abdominal:     General: Bowel sounds are normal.     Palpations: Abdomen is soft.  Musculoskeletal:     Comments: 1-2+ nonpitting edema bilateral lower extremities.  No unilateral disproportionate swelling or tenderness to palpation.  Skin:    General: Skin is warm and dry.     Capillary Refill: Capillary refill takes less than 2 seconds.  Neurological:     Mental Status: She is alert and oriented to person, place, and time.  Psychiatric:        Mood and Affect: Mood normal.        Behavior: Behavior normal.     ED Results / Procedures / Treatments   Labs (all labs ordered are listed, but only abnormal results are displayed) Labs Reviewed  CBC - Abnormal; Notable for the following components:      Result Value   RBC 3.17 (*)    Hemoglobin 9.3 (*)    HCT 28.9 (*)    All other components within normal limits  BASIC METABOLIC PANEL - Abnormal; Notable for the following components:   Glucose, Bld 103 (*)    BUN 40 (*)    Creatinine, Ser 3.16 (*)    GFR, Estimated 14 (*)    All other components within normal limits  BRAIN NATRIURETIC PEPTIDE  URINALYSIS, ROUTINE W REFLEX MICROSCOPIC    EKG EKG Interpretation  Date/Time:  Sunday April 05 2022 12:03:23 EDT Ventricular Rate:  77 PR Interval:  166 QRS Duration: 88 QT Interval:  381 QTC Calculation: 432 R Axis:   15 Text Interpretation: Sinus rhythm Low voltage, precordial leads Probable anteroseptal infarct, old Confirmed by Thamas Jaegers ([id number]) on 04/05/2022 4:44:49 PM  Radiology DG Chest 2 View  Result Date: 04/05/2022 CLINICAL DATA:  Pt c/o sob and weakness for several days. Denies cp, fever or n/v. HX of hypertension and CA. EXAM: CHEST - 2 VIEW COMPARISON:  01/08/2020. FINDINGS: Mild enlargement of the cardiopericardial silhouette. No mediastinal or hilar masses. No evidence of adenopathy. Clear lungs.  No pleural effusion or pneumothorax.  Skeletal structures are intact. IMPRESSION: No acute cardiopulmonary disease. Electronically Signed   By: Lajean Manes M.D.   On: 04/05/2022 14:04    Procedures Procedures    Medications Ordered in ED Medications  sodium chloride 0.9 % bolus 1,000 mL (has no administration in time range)    ED Course/ Medical Decision Making/ A&P Clinical Course as of 04/05/22 1723  Sun Apr 05, 2022  1642 Creatinine 1.9 in april [CP]  1648 Creatinine(!): 3.16 [CP]  1721 MCHC: 32.2 [CP]  Clinical Course User Index [CP] Anselmo Pickler, PA-C                           Medical Decision Making Amount and/or Complexity of Data Reviewed Labs: ordered. Decision-making details documented in ED Course. Radiology: ordered.  Risk Decision regarding hospitalization.   This patient is a 81 y.o. female who presents to the ED for concern of shortness of breath on and off for the last few weeks, this involves an extensive number of treatment options, and is a complaint that carries with it a high risk of complications and morbidity. The emergent differential diagnosis prior to evaluation includes, but is not limited to, new onset heart failure, pneumonia, acute bronchitis, upper respiratory infection, atypical ACS, electrolyte abnormality, dehydration, symptomatic anemia versus other.   This is not an exhaustive differential.   Past Medical History / Co-morbidities / Social History: hypertension, acid reflux, with breast cancer twice she is not currently being treated for breast cancer  Additional history: Chart reviewed. Pertinent results include: Reviewed outpatient oncology lab work from April, as well as lab work on file from October 2022.  Patient had normal creatinine in October 2022 with creatinine at 0.9, creatinine had elevated to 1.9 in April, patient had not been made aware, no presumptive cause discussed, AKI not addressed at that time.  Physical Exam: Physical exam performed. The  pertinent findings include: No significant respiratory distress, patient does have some 1-2+ bilateral edema without pitting.  She is overall well-appearing in no acute distress, no chest pain on my exam.  Lab Tests: I ordered, and personally interpreted labs.  The pertinent results include: CBC remarkable for hemoglobin 9.3, this is decreased from patient's baseline but not significantly, no recent baseline on file.  Patient without any dark tarry stools by her estimation.  BMP shows significant worsening kidney function, creatinine 3.16, BUN 40 today.  No electrolyte abnormalities.  BNP is unremarkable, no signs of heart failure or significant fluid overload.   Imaging Studies: I ordered imaging studies including chest x-ray. I independently visualized and interpreted imaging which showed no evidence of intrathoracic abnormality, pneumonia, pleural effusion. I agree with the radiologist interpretation.   Cardiac Monitoring:  The patient was maintained on a cardiac monitor.  My attending physician Dr. Almyra Free viewed and interpreted the cardiac monitored which showed an underlying rhythm of: Probable old infarct, but otherwise normal sinus rhythm, no signs of acute ischemia. I agree with this interpretation.   Medications: I ordered medication including fluid bolus for AKI.  Patient is being admitted for evaluation of her acute kidney injury and will need further evaluation to address improvement of kidney function over her admission.  Consultations Obtained: I requested consultation with the hospitalist, spoke with Dr. Denton Brick, spoke with nephrologist Dr. Johnney Ou,  and discussed lab and imaging findings as well as pertinent plan - they recommend: Admission, nephrology evaluation in the morning, renal ultrasound and urinalysis.  I discussed this case with my attending physician Dr. Almyra Free who cosigned this note including patient's presenting symptoms, physical exam, and planned diagnostics and  interventions. Attending physician stated agreement with plan or made changes to plan which were implemented.    Final Clinical Impression(s) / ED Diagnoses Final diagnoses:  None    Rx / DC Orders ED Discharge Orders     None         Dorien Chihuahua 04/05/22 1723    Luna Fuse, MD 04/13/22  1658  

## 2022-04-05 NOTE — Progress Notes (Signed)
Patient arrived to unit BP 174/59, P 88. MD Emokpae made aware. New orders placed.

## 2022-04-06 ENCOUNTER — Observation Stay (HOSPITAL_COMMUNITY): Payer: Medicare Other

## 2022-04-06 ENCOUNTER — Inpatient Hospital Stay (HOSPITAL_COMMUNITY): Payer: Medicare Other

## 2022-04-06 DIAGNOSIS — N281 Cyst of kidney, acquired: Secondary | ICD-10-CM | POA: Diagnosis not present

## 2022-04-06 DIAGNOSIS — F419 Anxiety disorder, unspecified: Secondary | ICD-10-CM | POA: Diagnosis present

## 2022-04-06 DIAGNOSIS — I776 Arteritis, unspecified: Secondary | ICD-10-CM | POA: Diagnosis present

## 2022-04-06 DIAGNOSIS — N1832 Chronic kidney disease, stage 3b: Secondary | ICD-10-CM

## 2022-04-06 DIAGNOSIS — R0600 Dyspnea, unspecified: Secondary | ICD-10-CM | POA: Diagnosis not present

## 2022-04-06 DIAGNOSIS — Z96653 Presence of artificial knee joint, bilateral: Secondary | ICD-10-CM | POA: Diagnosis present

## 2022-04-06 DIAGNOSIS — I824Z1 Acute embolism and thrombosis of unspecified deep veins of right distal lower extremity: Secondary | ICD-10-CM | POA: Diagnosis not present

## 2022-04-06 DIAGNOSIS — R0609 Other forms of dyspnea: Secondary | ICD-10-CM

## 2022-04-06 DIAGNOSIS — N179 Acute kidney failure, unspecified: Secondary | ICD-10-CM | POA: Diagnosis not present

## 2022-04-06 DIAGNOSIS — E669 Obesity, unspecified: Secondary | ICD-10-CM | POA: Diagnosis present

## 2022-04-06 DIAGNOSIS — E210 Primary hyperparathyroidism: Secondary | ICD-10-CM | POA: Diagnosis present

## 2022-04-06 DIAGNOSIS — Z87891 Personal history of nicotine dependence: Secondary | ICD-10-CM | POA: Diagnosis not present

## 2022-04-06 DIAGNOSIS — Z888 Allergy status to other drugs, medicaments and biological substances status: Secondary | ICD-10-CM | POA: Diagnosis not present

## 2022-04-06 DIAGNOSIS — I82401 Acute embolism and thrombosis of unspecified deep veins of right lower extremity: Secondary | ICD-10-CM | POA: Diagnosis not present

## 2022-04-06 DIAGNOSIS — I82402 Acute embolism and thrombosis of unspecified deep veins of left lower extremity: Secondary | ICD-10-CM | POA: Diagnosis not present

## 2022-04-06 DIAGNOSIS — I82442 Acute embolism and thrombosis of left tibial vein: Secondary | ICD-10-CM | POA: Diagnosis not present

## 2022-04-06 DIAGNOSIS — R32 Unspecified urinary incontinence: Secondary | ICD-10-CM | POA: Diagnosis present

## 2022-04-06 DIAGNOSIS — E213 Hyperparathyroidism, unspecified: Secondary | ICD-10-CM | POA: Diagnosis present

## 2022-04-06 DIAGNOSIS — Z7982 Long term (current) use of aspirin: Secondary | ICD-10-CM | POA: Diagnosis not present

## 2022-04-06 DIAGNOSIS — M7989 Other specified soft tissue disorders: Secondary | ICD-10-CM | POA: Diagnosis not present

## 2022-04-06 DIAGNOSIS — Z170 Estrogen receptor positive status [ER+]: Secondary | ICD-10-CM | POA: Diagnosis not present

## 2022-04-06 DIAGNOSIS — D509 Iron deficiency anemia, unspecified: Secondary | ICD-10-CM | POA: Diagnosis not present

## 2022-04-06 DIAGNOSIS — Z96641 Presence of right artificial hip joint: Secondary | ICD-10-CM | POA: Diagnosis present

## 2022-04-06 DIAGNOSIS — Z923 Personal history of irradiation: Secondary | ICD-10-CM | POA: Diagnosis not present

## 2022-04-06 DIAGNOSIS — Z853 Personal history of malignant neoplasm of breast: Secondary | ICD-10-CM | POA: Diagnosis not present

## 2022-04-06 DIAGNOSIS — K219 Gastro-esophageal reflux disease without esophagitis: Secondary | ICD-10-CM | POA: Diagnosis present

## 2022-04-06 DIAGNOSIS — C50411 Malignant neoplasm of upper-outer quadrant of right female breast: Secondary | ICD-10-CM | POA: Diagnosis not present

## 2022-04-06 DIAGNOSIS — Z803 Family history of malignant neoplasm of breast: Secondary | ICD-10-CM | POA: Diagnosis not present

## 2022-04-06 DIAGNOSIS — Z6839 Body mass index (BMI) 39.0-39.9, adult: Secondary | ICD-10-CM | POA: Diagnosis not present

## 2022-04-06 DIAGNOSIS — R0602 Shortness of breath: Secondary | ICD-10-CM | POA: Diagnosis not present

## 2022-04-06 DIAGNOSIS — I824Z2 Acute embolism and thrombosis of unspecified deep veins of left distal lower extremity: Secondary | ICD-10-CM | POA: Diagnosis present

## 2022-04-06 DIAGNOSIS — I10 Essential (primary) hypertension: Secondary | ICD-10-CM | POA: Diagnosis not present

## 2022-04-06 DIAGNOSIS — C50412 Malignant neoplasm of upper-outer quadrant of left female breast: Secondary | ICD-10-CM | POA: Diagnosis not present

## 2022-04-06 DIAGNOSIS — D649 Anemia, unspecified: Secondary | ICD-10-CM | POA: Diagnosis not present

## 2022-04-06 DIAGNOSIS — N289 Disorder of kidney and ureter, unspecified: Secondary | ICD-10-CM | POA: Diagnosis not present

## 2022-04-06 DIAGNOSIS — N009 Acute nephritic syndrome with unspecified morphologic changes: Secondary | ICD-10-CM | POA: Diagnosis not present

## 2022-04-06 DIAGNOSIS — Z882 Allergy status to sulfonamides status: Secondary | ICD-10-CM | POA: Diagnosis not present

## 2022-04-06 DIAGNOSIS — N008 Acute nephritic syndrome with other morphologic changes: Secondary | ICD-10-CM | POA: Diagnosis present

## 2022-04-06 DIAGNOSIS — E559 Vitamin D deficiency, unspecified: Secondary | ICD-10-CM | POA: Diagnosis present

## 2022-04-06 LAB — BASIC METABOLIC PANEL
Anion gap: 1 — ABNORMAL LOW (ref 5–15)
BUN: 34 mg/dL — ABNORMAL HIGH (ref 8–23)
CO2: 27 mmol/L (ref 22–32)
Calcium: 9.2 mg/dL (ref 8.9–10.3)
Chloride: 111 mmol/L (ref 98–111)
Creatinine, Ser: 2.99 mg/dL — ABNORMAL HIGH (ref 0.44–1.00)
GFR, Estimated: 15 mL/min — ABNORMAL LOW (ref >60)
Glucose, Bld: 102 mg/dL — ABNORMAL HIGH (ref 70–99)
Potassium: 4.1 mmol/L (ref 3.5–5.1)
Sodium: 139 mmol/L (ref 135–145)

## 2022-04-06 LAB — ECHOCARDIOGRAM COMPLETE
AR max vel: 1.97 cm2
AV Area VTI: 1.72 cm2
AV Area mean vel: 1.78 cm2
AV Mean grad: 12 mmHg
AV Peak grad: 24.4 mmHg
Ao pk vel: 2.47 m/s
Area-P 1/2: 3.21 cm2
Calc EF: 71.7 %
Height: 65 in
MV VTI: 1.99 cm2
S' Lateral: 2.8 cm
Single Plane A2C EF: 73.1 %
Single Plane A4C EF: 69.5 %
Weight: 3795.44 oz

## 2022-04-06 LAB — HEPATITIS PANEL, ACUTE
HCV Ab: NONREACTIVE
Hep A IgM: NONREACTIVE
Hep B C IgM: NONREACTIVE
Hepatitis B Surface Ag: NONREACTIVE

## 2022-04-06 LAB — CBC
HCT: 24.2 % — ABNORMAL LOW (ref 36.0–46.0)
Hemoglobin: 7.6 g/dL — ABNORMAL LOW (ref 12.0–15.0)
MCH: 29 pg (ref 26.0–34.0)
MCHC: 31.4 g/dL (ref 30.0–36.0)
MCV: 92.4 fL (ref 80.0–100.0)
Platelets: 284 10*3/uL (ref 150–400)
RBC: 2.62 MIL/uL — ABNORMAL LOW (ref 3.87–5.11)
RDW: 14.3 % (ref 11.5–15.5)
WBC: 5.3 10*3/uL (ref 4.0–10.5)
nRBC: 0 % (ref 0.0–0.2)

## 2022-04-06 LAB — PREPARE RBC (CROSSMATCH)

## 2022-04-06 LAB — PROTEIN / CREATININE RATIO, URINE
Creatinine, Urine: 63.68 mg/dL
Protein Creatinine Ratio: 3.36 mg/mg{Cre} — ABNORMAL HIGH (ref 0.00–0.15)
Total Protein, Urine: 214 mg/dL

## 2022-04-06 MED ORDER — DIPHENHYDRAMINE HCL 50 MG/ML IJ SOLN
25.0000 mg | Freq: Once | INTRAMUSCULAR | Status: AC
  Administered 2022-04-06: 25 mg via INTRAVENOUS
  Filled 2022-04-06: qty 1

## 2022-04-06 MED ORDER — ANASTROZOLE 1 MG PO TABS
1.0000 mg | ORAL_TABLET | Freq: Every day | ORAL | Status: DC
  Administered 2022-04-06 – 2022-04-13 (×8): 1 mg via ORAL
  Filled 2022-04-06 (×10): qty 1

## 2022-04-06 MED ORDER — TIMOLOL MALEATE 0.5 % OP SOLN
1.0000 [drp] | Freq: Two times a day (BID) | OPHTHALMIC | Status: DC
  Administered 2022-04-06 – 2022-04-13 (×15): 1 [drp] via OPHTHALMIC
  Filled 2022-04-06: qty 5

## 2022-04-06 MED ORDER — SODIUM CHLORIDE 0.9 % IV SOLN: INTRAVENOUS | Status: AC

## 2022-04-06 MED ORDER — BRIMONIDINE TARTRATE-TIMOLOL 0.2-0.5 % OP SOLN
1.0000 [drp] | Freq: Two times a day (BID) | OPHTHALMIC | Status: DC
Start: 2022-04-06 — End: 2022-04-06

## 2022-04-06 MED ORDER — HYDRALAZINE HCL 20 MG/ML IJ SOLN
10.0000 mg | Freq: Once | INTRAMUSCULAR | Status: AC
  Administered 2022-04-06: 10 mg via INTRAVENOUS
  Filled 2022-04-06: qty 1

## 2022-04-06 MED ORDER — ACETAMINOPHEN 325 MG PO TABS
650.0000 mg | ORAL_TABLET | Freq: Once | ORAL | Status: AC
Start: 2022-04-06 — End: 2022-04-06
  Administered 2022-04-06: 650 mg via ORAL
  Filled 2022-04-06: qty 2

## 2022-04-06 MED ORDER — HEPARIN (PORCINE) 25000 UT/250ML-% IV SOLN
1000.0000 [IU]/h | INTRAVENOUS | Status: DC
Start: 2022-04-06 — End: 2022-04-09
  Administered 2022-04-06 – 2022-04-07 (×2): 1300 [IU]/h via INTRAVENOUS
  Filled 2022-04-06 (×3): qty 250

## 2022-04-06 MED ORDER — AMLODIPINE BESYLATE 5 MG PO TABS
5.0000 mg | ORAL_TABLET | Freq: Every day | ORAL | Status: DC
  Administered 2022-04-06 – 2022-04-08 (×3): 5 mg via ORAL
  Filled 2022-04-06 (×3): qty 1

## 2022-04-06 MED ORDER — SODIUM CHLORIDE 0.9% IV SOLUTION: Freq: Once | INTRAVENOUS | Status: AC

## 2022-04-06 MED ORDER — HEPARIN BOLUS VIA INFUSION
5000.0000 [IU] | Freq: Once | INTRAVENOUS | Status: AC
  Administered 2022-04-06: 5000 [IU] via INTRAVENOUS
  Filled 2022-04-06: qty 5000

## 2022-04-06 MED ORDER — PANTOPRAZOLE SODIUM 40 MG PO TBEC
40.0000 mg | DELAYED_RELEASE_TABLET | Freq: Every day | ORAL | Status: DC
  Administered 2022-04-06 – 2022-04-13 (×8): 40 mg via ORAL
  Filled 2022-04-06 (×8): qty 1

## 2022-04-06 MED ORDER — BRIMONIDINE TARTRATE 0.2 % OP SOLN
1.0000 [drp] | Freq: Two times a day (BID) | OPHTHALMIC | Status: DC
  Administered 2022-04-06 – 2022-04-13 (×15): 1 [drp] via OPHTHALMIC
  Filled 2022-04-06: qty 5

## 2022-04-06 NOTE — Progress Notes (Signed)
  Transition of Care [facility]) Screening Note   Patient Details  Name: [name] [name] Date of Birth: [birth year]   Transition of Care [facility]) CM/SW Contact:    Ihor Gully, LCSW Phone Number: 04/06/2022, 2:07 PM    Transition of Care Department [facility]) has reviewed patient and no TOC needs have been identified at this time. We will continue to monitor patient advancement through interdisciplinary progression rounds. If new patient transition needs arise, please place a TOC consult.

## 2022-04-06 NOTE — Assessment & Plan Note (Signed)
-   Positive lower extremity Dopplers for right distal leg DVT -Heparin initiated. -At least 6 months of anticoagulation using Eliquis will be required at discharge -Sedentarism and prior history of breast cancer with very likely increased viscosity due to malignancy as recent for blood clots.

## 2022-04-06 NOTE — Plan of Care (Signed)

## 2022-04-06 NOTE — Plan of Care (Signed)
  Problem: Education: Goal: Knowledge of General Education information will improve Description: Including pain rating scale, medication(s)/side effects and non-pharmacologic comfort measures 04/06/2022 2224 by Jule Ser, RN Outcome: Progressing 04/06/2022 2215 by Jule Ser, RN Outcome: Progressing   Problem: Health Behavior/Discharge Planning: Goal: Ability to manage health-related needs will improve 04/06/2022 2224 by Jule Ser, RN Outcome: Progressing 04/06/2022 2215 by Jule Ser, RN Outcome: Progressing   Problem: Clinical Measurements: Goal: Ability to maintain clinical measurements within normal limits will improve 04/06/2022 2224 by Jule Ser, RN Outcome: Progressing 04/06/2022 2215 by Jule Ser, RN Outcome: Progressing Goal: Will remain free from infection 04/06/2022 2224 by Jule Ser, RN Outcome: Progressing 04/06/2022 2215 by Jule Ser, RN Outcome: Progressing Goal: Diagnostic test results will improve 04/06/2022 2224 by Jule Ser, RN Outcome: Progressing 04/06/2022 2215 by Jule Ser, RN Outcome: Progressing Goal: Respiratory complications will improve 04/06/2022 2224 by Jule Ser, RN Outcome: Progressing 04/06/2022 2215 by Jule Ser, RN Outcome: Progressing Goal: Cardiovascular complication will be avoided 04/06/2022 2224 by Jule Ser, RN Outcome: Progressing 04/06/2022 2215 by Jule Ser, RN Outcome: Progressing   Problem: Activity: Goal: Risk for activity intolerance will decrease 04/06/2022 2224 by Jule Ser, RN Outcome: Progressing 04/06/2022 2215 by Jule Ser, RN Outcome: Progressing   Problem: Nutrition: Goal: Adequate nutrition will be maintained 04/06/2022 2224 by Jule Ser, RN Outcome: Progressing 04/06/2022 2215 by Jule Ser, RN Outcome: Progressing   Problem: Coping: Goal: Level of anxiety will decrease 04/06/2022 2224 by Jule Ser, RN Outcome: Progressing 04/06/2022 2215 by Jule Ser, RN Outcome: Progressing   Problem: Elimination: Goal: Will not experience complications related to bowel motility 04/06/2022 2224 by Jule Ser, RN Outcome: Progressing 04/06/2022 2215 by Jule Ser, RN Outcome: Progressing Goal: Will not experience complications related to urinary retention 04/06/2022 2224 by Jule Ser, RN Outcome: Progressing 04/06/2022 2215 by Jule Ser, RN Outcome: Progressing   Problem: Pain Managment: Goal: General experience of comfort will improve 04/06/2022 2224 by Jule Ser, RN Outcome: Progressing 04/06/2022 2215 by Jule Ser, RN Outcome: Progressing   Problem: Safety: Goal: Ability to remain free from injury will improve 04/06/2022 2224 by Jule Ser, RN Outcome: Progressing 04/06/2022 2215 by Jule Ser, RN Outcome: Progressing   Problem: Skin Integrity: Goal: Risk for impaired skin integrity will decrease 04/06/2022 2224 by Jule Ser, RN Outcome: Progressing 04/06/2022 2215 by Jule Ser, RN Outcome: Progressing

## 2022-04-06 NOTE — Assessment & Plan Note (Signed)
-  Body mass index is 39.47 kg/m. -Low calorie diet, portion control and increase activity discussed with patient.

## 2022-04-06 NOTE — Care Management Obs Status (Signed)
[place] NOTIFICATION   Patient Details  Name: DUSTY WAGONER MRN: [id number] Date of Birth: [birth year]   Medicare Observation Status Notification Given:  Yes    Ihor Gully, LCSW 04/06/2022, 11:11 AM

## 2022-04-06 NOTE — Progress Notes (Signed)
Called to pt's room by family member, pt states feeling very hot in her chest and face, feels like her face is "on fire". Skin is hot to touch, no rash or swelling noted of face/body. Chest clear to auscultation, HR RRR. Oral temp 98.8, axillary temp 99.4. Temp rechecked with different machine, oral 98.7, axillary 99.6. Blood transfusion stopped, MD notified via [place] page for further instructions.

## 2022-04-06 NOTE — Progress Notes (Addendum)
Pt states feeling less flushed, skin still hot to touch, dry, no rash, no SOB. VSS except temp up to 99.6. Orders received from MD. Night shift nurse Ulyses Jarred, RN updated on current pt condition and new orders from MD.

## 2022-04-06 NOTE — Consult Note (Signed)
Reason for Consult:AKI Referring Physician: Dyann Kief, MD  [name] [name] is an 81 y.o. female with a PMH significant for HTN, recurrent breast cancer, GERD, obesity, DJD, primary hyperparathyroidism, and iron deficiency anemia who presents with a 2 week history of SOB, lower extremity edema, and DOE.  In the ED, vital signs were stable.  CXR without acute abnormality.  Labs were notable for BUN 40, Cr 3.16, gluc 103, Hgb 9.3.  UA moderate blood, +protein 100 mg/dL, and >50 RBC/HPF.  She was admitted for AKI and started on IVF's.  Scr did improve somewhat overnight.  We were consulted to further evaluate and manage her AKI.  She denies any N/V/D, dysuria, pyuria, hematuria, urgency, or retention but has had frequency, and "bubbles in my urine" for the past month.  She denies any NSAIDs/Cox-II I's, recent IV contrasted studies.  Her Scr did begin to rise on 02/16/22 but she was never told about this.  Trend in Creatinine: Creatinine, Ser  Date/Time Value Ref Range Status  04/06/2022 04:44 AM 2.99 (H) 0.44 - 1.00 mg/dL Final  04/05/2022 01:17 PM 3.16 (H) 0.44 - 1.00 mg/dL Final  02/16/2022 1.91 (H) 0.60 - 1.10 mg/dL Final  08/07/2021 0.94 0.60 - 1.10 mg/dL Final  05/05/2021 0.89 0.60 - 1.10 mg/dL Final  01/06/2021 1.12 (H) 0.60 - 1.10 mg/dL Final  10/07/2020 0.83 0.60 - 1.10 mg/dL Final  10/06/2018 04:10 PM 0.81 0.44 - 1.00 mg/dL Final  09/16/2018 04:40 AM 1.01 (H) 0.44 - 1.00 mg/dL Final  09/09/2018 04:26 PM 0.99 0.44 - 1.00 mg/dL Final  05/25/2016 12:00 PM 0.88 0.44 - 1.00 mg/dL Final  02/09/2010 04:00 AM 0.93 0.4 - 1.2 mg/dL Final  02/08/2010 04:15 AM 0.94 0.4 - 1.2 mg/dL Final  02/03/2010 10:30 AM 1.04 0.4 - 1.2 mg/dL Final  05/10/2009 04:20 AM 0.90 0.4 - 1.2 mg/dL Final  05/09/2009 04:35 AM 0.93 0.4 - 1.2 mg/dL Final  05/08/2009 04:10 AM 0.92 0.4 - 1.2 mg/dL Final  05/02/2009 01:35 PM 0.90 0.4 - 1.2 mg/dL Final    PMH:   Past Medical History:  Diagnosis Date   Anemia    Anxiety     Arthritis    back, shoulders ,  right hip   Breast cancer, left [facility]) oncologist-- dr Audelia Hives ([facility] [place])--- per lov in epic no recurrence   dx 05/ 2011---- Stage I (T1,N0M0), DCIS, ER+;  s/p  left breast lumpectomy ,  completed radiation therapy 06-02-2010,  started antiestrogen   Full dentures    GERD (gastroesophageal reflux disease)    History of colon polyps 2009   Dr. Collene Mares   History of external beam radiation therapy 04-21-2010  to 06-02-2010   left breast cancer   Hyperparathyroidism ([place])    per pt dx 03/ 2019,  was told by surgeon not high enough to worry about   Hypertension    Vitamin D deficiency    Wears glasses     PSH:   Past Surgical History:  Procedure Laterality Date   APPLICATION OF WOUND VAC Right 10/06/2018   Procedure: APPLICATION OF WOUND VAC;  Surgeon: Rod Can, MD;  Location: [facility];  Service: Orthopedics;  Laterality: Right;   BREAST LUMPECTOMY Left 2011   BREAST LUMPECTOMY Left 05/02/2020   COLONOSCOPY N/A 05/18/2013   Procedure: COLONOSCOPY;  Surgeon: Daneil Dolin, MD;  Location: [facility];  Service: Endoscopy;  Laterality: N/A;  9:30   DILATATION & CURETTAGE/HYSTEROSCOPY WITH MYOSURE N/A 05/28/2016   Procedure: DILATATION &  CURETTAGE/HYSTEROSCOPY WITH MYOSURE;  Surgeon: Servando Salina, MD;  Location: [facility];  Service: Gynecology;  Laterality: N/A;   INCISION AND DRAINAGE HIP Right 10/06/2018   Procedure: DEBRIDEMENT AND CLOSURE RIGHT HIP;  Surgeon: Rod Can, MD;  Location: [facility];  Service: Orthopedics;  Laterality: Right;   TOTAL HIP ARTHROPLASTY Right 09/15/2018   Procedure: TOTAL HIP ARTHROPLASTY ANTERIOR APPROACH;  Surgeon: Rod Can, MD;  Location: [facility];  Service: Orthopedics;  Laterality: Right;   TOTAL KNEE ARTHROPLASTY Bilateral left 05-07-2009;  right 02-07-2010   both by dr Theda Sers '@[facility]'$     Allergies:  Allergies  Allergen Reactions   Ace Inhibitors Swelling   Sulfa Antibiotics Swelling and Rash     Medications:   Prior to Admission medications   Medication Sig Start Date End Date Taking? Authorizing Provider  amLODipine (NORVASC) 10 MG tablet Take 10 mg by mouth daily. 02/26/22  Yes [provider]  anastrozole (ARIMIDEX) 1 MG tablet Take 1 mg by mouth daily. 02/24/22  Yes [provider]  cetirizine (ZYRTEC) 10 MG tablet Take 10 mg by mouth every morning.    Yes [provider]  Cholecalciferol 25 MCG (1000 UT) tablet Take by mouth.   Yes [provider]  COMBIGAN 0.2-0.5 % ophthalmic solution Place 1 drop into both eyes every 12 (twelve) hours. 05/22/20  Yes [provider]  famotidine (PEPCID) 10 MG tablet Take 10 mg by mouth 2 (two) times daily.   Yes [provider]  hydrALAZINE (APRESOLINE) 100 MG tablet Take 100 mg by mouth 3 (three) times daily. 02/25/22  Yes [provider]  ROCKLATAN 0.02-0.005 % SOLN Apply 1 drop to eye at bedtime. 02/23/22  Yes [provider]    Inpatient medications:  heparin  5,000 Units Subcutaneous Q8H   hydrALAZINE  100 mg Oral TID    Discontinued Meds:   Medications Discontinued During This Encounter  Medication Reason   aspirin 81 MG chewable tablet Discontinued by provider   amLODipine (NORVASC) 5 MG tablet Dose change   docusate sodium (COLACE) 100 MG capsule Patient Preference   HYDROcodone-acetaminophen (NORCO/VICODIN) 5-325 MG tablet Completed Course   latanoprost (XALATAN) 0.005 % ophthalmic solution Completed Course   ondansetron (ZOFRAN) 4 MG tablet Completed Course   ranitidine (ZANTAC) 150 MG tablet Patient Preference   senna (SENOKOT) 8.6 MG TABS tablet Patient Preference   hydrALAZINE (APRESOLINE) 50 MG tablet Dose change    Social History:  reports that she quit smoking about 47 years ago. Her smoking use included cigarettes. She has never used smokeless tobacco. She reports that she does not drink alcohol and does not use drugs.  Family History:   Family  History  Problem Relation Age of Onset   Breast cancer Mother    Colon cancer Neg Hx    Lung cancer Neg Hx    Ovarian cancer Neg Hx     Pertinent items are noted in HPI. Weight change:   Intake/Output Summary (Last 24 hours) at 04/06/2022 0859 Last data filed at 04/06/2022 0530 Gross per 24 hour  Intake 1929.78 ml  Output 340 ml  Net 1589.78 ml   BP (!) 182/72 (BP Location: Right Arm)   Pulse 79   Temp 99.6 F (37.6 C) (Oral)   Resp 20   Ht '5\' 5"'$  (1.651 m)   Wt 107.6 kg   SpO2 94% Comment: Denies shortness of breath  BMI 39.47 kg/m  Vitals:   04/05/22 1847 04/05/22 2216 04/06/22 0352 04/06/22 1610  BP: (!) 174/59 (!) 152/49 (!) 161/53 (!) 182/72  Pulse: 88 80 80 79  Resp: '20 18 16 20  '$ Temp: 98.5 F (36.9 C) 98.8 F (37.1 C) 99.8 F (37.7 C) 99.6 F (37.6 C)  TempSrc: Oral Oral Oral Oral  SpO2: 96% 98% 94% 94%  Weight: 107.6 kg     Height: '5\' 5"'$  (1.651 m)        General appearance: alert, cooperative, and no distress Head: Normocephalic, without obvious abnormality, atraumatic Resp: clear to auscultation bilaterally Cardio: regular rate and rhythm, S1, S2 normal, no murmur, click, rub or gallop GI: soft, non-tender; bowel sounds normal; no masses,  no organomegaly Extremities: extremities normal, atraumatic, no cyanosis or edema  Labs: Basic Metabolic Panel: Recent Labs  Lab 04/05/22 1317 04/06/22 0444  NA 139 139  K 4.0 4.1  CL 106 111  CO2 26 27  GLUCOSE 103* 102*  BUN 40* 34*  CREATININE 3.16* 2.99*  CALCIUM 9.9 9.2   Liver Function Tests: No results for input(s): "AST", "ALT", "ALKPHOS", "BILITOT", "PROT", "ALBUMIN" in the last 168 hours. No results for input(s): "LIPASE", "AMYLASE" in the last 168 hours. No results for input(s): "AMMONIA" in the last 168 hours. CBC: Recent Labs  Lab 04/05/22 1317 04/06/22 0444  WBC 5.6 5.3  HGB 9.3* 7.6*  HCT 28.9* 24.2*  MCV 91.2 92.4  PLT 278 284   PT/INR: '@LABRCNTIP'$ (inr:5) Cardiac Enzymes: )No  results for input(s): "CKTOTAL", "CKMB", "CKMBINDEX", "TROPONINI" in the last 168 hours. CBG: No results for input(s): "GLUCAP" in the last 168 hours.  Iron Studies: No results for input(s): "IRON", "TIBC", "TRANSFERRIN", "FERRITIN" in the last 168 hours.  Xrays/Other Studies: DG Chest 2 View  Result Date: 04/05/2022 CLINICAL DATA:  Pt c/o sob and weakness for several days. Denies cp, fever or n/v. HX of hypertension and CA. EXAM: CHEST - 2 VIEW COMPARISON:  01/08/2020. FINDINGS: Mild enlargement of the cardiopericardial silhouette. No mediastinal or hilar masses. No evidence of adenopathy. Clear lungs.  No pleural effusion or pneumothorax. Skeletal structures are intact. IMPRESSION: No acute cardiopulmonary disease. Electronically Signed   By: Lajean Manes M.D.   On: 04/05/2022 14:04     Assessment/Plan:  AKI vs Subacute Kidney injury - new proteinuria and microscopic hematuria with Scr first noted to be elevated in April 2023.  Renal US ordered.  Will start acute GN workup.  Scr slightly improved with IVF's.  No indication for dialysis at this time Iron deficiency - Hgb dropping since admission.  Will check iron stores and replete.  Hold off on ESA for now.  Transfuse prn. HTN - not well controlled.  Currently on hydralazine and amlodipine.  Hold ACE/ARB for now.  Titrate dose to goal bp of 130/80 SOB/DOE - possibly due to anemia but should also check ECHO to r/o cardiac source. Recurrent breast cancer - s/p lumpectomy, XRT, and anastrozole.   Governor [place] Vaughn Beaumier 04/06/2022, 8:59 AM

## 2022-04-06 NOTE — Assessment & Plan Note (Addendum)
-  In the setting of anemia chronic kidney disease -No overt bleeding appreciated -Given symptomatic symptoms after discussing with nephrology service 1 unit of PRBCs transfused -IV iron and Epogen therapy as per renal discretion. -Current hemoglobin after transfusion 8.1>>8.7>>7.8>>8.4>>8.4

## 2022-04-06 NOTE — Progress Notes (Signed)
ANTICOAGULATION CONSULT NOTE - Initial Consult  Pharmacy Consult for Heparin Indication: LLE DVT  Allergies  Allergen Reactions   Ace Inhibitors Swelling   Sulfa Antibiotics Swelling and Rash    Patient Measurements: Height: '5\' 5"'$  (165.1 cm) Weight: 107.6 kg (237 lb 3.4 oz) IBW/kg (Calculated) : 57 Heparin Dosing Weight: 82.2 kg  Labs: Recent Labs    04/05/22 1317 04/06/22 0444  HGB 9.3* 7.6*  HCT 28.9* 24.2*  PLT 278 284  CREATININE 3.16* 2.99*    Estimated Creatinine Clearance: 18.9 mL/min (A) (by C-G formula based on SCr of 2.99 mg/dL (H)).  Assessment: 81 year old female admitted with AKI, bilateral lower extremity edema found to have LLE DVT on [place]. Pharmacy consulted to assist with systemic heparin anticoagulation. Last SQ heparin dose was at  0530am.  Goal of Therapy:  Heparin level 0.3-0.7 units/ml Monitor platelets by anticoagulation protocol: Yes   Plan:  Heparin bolus 5000 units Heparin gtt at 1300 units/hr = 13 ml/hr Heparin level in 8 hours Daily heparin labs and CBC  Lorenso Courier, PharmD Clinical Pharmacist 04/06/2022 1:47 PM

## 2022-04-06 NOTE — Progress Notes (Signed)
*  PRELIMINARY RESULTS* Echocardiogram 2D Echocardiogram has been performed.  [name] [name] 04/06/2022, 4:41 PM

## 2022-04-06 NOTE — Progress Notes (Addendum)
Progress Note   Patient: [name] [name] IEP:[id number] DOB: 1940/11/04 DOA: 04/05/2022     0 DOS: the patient was seen and examined on 04/06/2022   Brief hospital course: As per H&P written by Dr. Denton Brick on 04/05/2022 [name] [name] is a 81 y.o. female with medical history significant for breast cancer, anemia, hyperparathyroidism, hypertension. Patient presented to the ED with intermittent difficulty breathing over the past 2 weeks.  Symptoms are mostly with exertion.  She denies cough.  No chest pain.  She reports swelling of her lower extremities today.  She feels her abdomen is bloated.  Checks her weight regularly, she reports there has been no change. She denies vomiting, no loose stools.  She has maintained good oral intake.  Not on any diuretics.   ED Course:  Temp 97.6.  Heart rate 70s to 80s.  Respiratory rate 13-24.  Blood pressure systolic 660Y to 301S. Creatinine elevated at 3.16, BNP 73.  Chest x-ray without acute abnormality. 1 L bolus given.  Renal ultrasound ordered.  Assessment and Plan: * AKI (acute kidney injury) ([place]) -Cr-3.16, per Care Everywhere last check 4/24 creatinine was 1.9.  -Cr down 2.99 after IVF's. -avoid nephrotoxic agents -maintain adequate hydration -nephrology expressing concerns for possible GM -will follow results of initiated work up and follow renal function trend -patient might need renal biopsy. -renal US without hydronephrosis    Left leg DVT ([place]) - Positive lower extremity Dopplers for left distal leg DVT -Heparin initiated. -At least 6 months of anticoagulation using Eliquis will be required at discharge -Sedentarism and prior history of breast cancer with very likely increased viscosity due to malignancy as recent for blood clots.  Obesity, Class III, BMI 40-49.9 (morbid obesity) (HCC) -Body mass index is 39.47 kg/m. -Low calorie diet, portion control and increase activity discussed with patient.  Dyspnea -Dyspnea with  exertion. -Possible associated with anemia -1 unit PRBCs will be provided -2D echo will be assessed; BMP very likely falsely low in the setting of morbid obesity.   -Lower extremity Doppler with positive right leg DVT; therapy with anticoagulation will be initiated.   -Maintain adequate hydration and follow clinical response.     HTN (hypertension) -Systolic 010X to 323F. -Continue the use of hydralazine and adjust the dose of Norvasc. -Heart healthy/low-sodium diet discussed with patient.  Malignant neoplasm of upper outer quadrant of female breast ([place]) -Status post lumpectomy, radiation treatment, currently on anastrozole. -Continue patient follow-up with oncology service.  Anemia, iron deficiency - In the setting of anemia chronic kidney disease -No overt bleeding appreciated -Given symptomatic symptoms after discussing with nephrology service 1 unit of PRBCs will be transfused -IV iron and Epogen therapy as per renal discretion.   Subjective:  Afebrile, no chest pain, no nausea, no vomiting.  Reports feeling weak and tired but better overall.  Tolerating diet without problems.  No requiring oxygen supplementation.  Physical Exam: Vitals:   04/06/22 0610 04/06/22 1423 04/06/22 1645 04/06/22 1735  BP: (!) 182/72  (!) 160/80 (!) 160/80  Pulse: 79 77 79 84  Resp: '20 18 18 18  '$ Temp: 99.6 F (37.6 C) 98.7 F (37.1 C) 98.2 F (36.8 C) 97.8 F (36.6 C)  TempSrc: Oral Oral Oral Oral  SpO2: 94% 96% 93% 96%  Weight:      Height:       General exam: Alert, awake, oriented x 3; obese, no using oxygen supplementation reporting feeling weak and tired.  No chest pain, no nausea, no vomiting.  Tolerating diet. Respiratory system: Clear to auscultation. Respiratory effort normal.  No using accessory muscle.  While at rest oxygen supplementation within normal limits on room air. Cardiovascular system:RRR. No murmurs, rubs, gallops.  Unable to assess JVD with body  habitus. Gastrointestinal system: Abdomen is obese, nondistended, soft and nontender. No organomegaly or masses felt. Normal bowel sounds heard. Central nervous system: Alert and oriented. No focal neurological deficits. Extremities: No cyanosis or clubbing; 2-3+ edema bilaterally; patient reporting no pain on palpation. Skin: No petechiae. Psychiatry: Judgement and insight appear normal. Mood & affect appropriate.   Data Reviewed: Basic metabolic panel demonstrating sodium 139, potassium 4.1, CO2 27, BUN 34, creatinine 2.99; and GFR 15. CBC: Hemoglobin 7.6, platelets count 284 K and WBCs 5.3  Family Communication: Daughters at bedside.  Disposition: Status is: Inpatient Remains inpatient appropriate because: Renal function is still not back to baseline; work-up in progress by nephrology service.  Positive left lower extremity DVT initiating treatment with heparin.  Planned Discharge Destination: Home   Author: Barton Dubois, MD 04/06/2022 7:01 PM  For on call review [email or web address].

## 2022-04-07 DIAGNOSIS — I10 Essential (primary) hypertension: Secondary | ICD-10-CM | POA: Diagnosis not present

## 2022-04-07 DIAGNOSIS — N179 Acute kidney failure, unspecified: Secondary | ICD-10-CM | POA: Diagnosis not present

## 2022-04-07 DIAGNOSIS — I82402 Acute embolism and thrombosis of unspecified deep veins of left lower extremity: Secondary | ICD-10-CM | POA: Diagnosis present

## 2022-04-07 DIAGNOSIS — R0600 Dyspnea, unspecified: Secondary | ICD-10-CM | POA: Diagnosis not present

## 2022-04-07 DIAGNOSIS — C50412 Malignant neoplasm of upper-outer quadrant of left female breast: Secondary | ICD-10-CM | POA: Diagnosis not present

## 2022-04-07 LAB — ANA: Anti Nuclear Antibody (ANA): POSITIVE — AB

## 2022-04-07 LAB — EXTRACTABLE NUCLEAR ANTIGEN ANTIBODY
ENA SM Ab Ser-aCnc: <0.2 AI (ref 0.0–0.9)
Ribonucleic Protein: 0.3 AI (ref 0.0–0.9)
SSA (Ro) (ENA) Antibody, IgG: 1.2 AI — ABNORMAL HIGH (ref 0.0–0.9)
SSB (La) (ENA) Antibody, IgG: <0.2 AI (ref 0.0–0.9)
Scleroderma (Scl-70) (ENA) Antibody, IgG: <0.2 AI (ref 0.0–0.9)
ds DNA Ab: 260 IU/mL — ABNORMAL HIGH (ref 0–9)

## 2022-04-07 LAB — TYPE AND SCREEN
ABO/RH(D): O POS
Antibody Screen: NEGATIVE
Unit division: 0

## 2022-04-07 LAB — HEPARIN LEVEL (UNFRACTIONATED)
Heparin Unfractionated: 0.68 IU/mL (ref 0.30–0.70)
Heparin Unfractionated: 0.94 IU/mL — ABNORMAL HIGH (ref 0.30–0.70)
Heparin Unfractionated: 0.72 IU/mL — ABNORMAL HIGH (ref 0.30–0.70)

## 2022-04-07 LAB — ANTI-DNA ANTIBODY, DOUBLE-STRANDED: ds DNA Ab: 231 IU/mL — ABNORMAL HIGH (ref 0–9)

## 2022-04-07 LAB — RENAL FUNCTION PANEL
Albumin: 2.7 g/dL — ABNORMAL LOW (ref 3.5–5.0)
Anion gap: 5 (ref 5–15)
BUN: 32 mg/dL — ABNORMAL HIGH (ref 8–23)
CO2: 24 mmol/L (ref 22–32)
Calcium: 9.1 mg/dL (ref 8.9–10.3)
Chloride: 110 mmol/L (ref 98–111)
Creatinine, Ser: 2.75 mg/dL — ABNORMAL HIGH (ref 0.44–1.00)
GFR, Estimated: 17 mL/min — ABNORMAL LOW (ref >60)
Glucose, Bld: 100 mg/dL — ABNORMAL HIGH (ref 70–99)
Phosphorus: 3.2 mg/dL (ref 2.5–4.6)
Potassium: 3.8 mmol/L (ref 3.5–5.1)
Sodium: 139 mmol/L (ref 135–145)

## 2022-04-07 LAB — ANTI-JO 1 ANTIBODY, IGG: Anti JO-1: <0.2 AI (ref 0.0–0.9)

## 2022-04-07 LAB — BPAM RBC
Blood Product Expiration Date: 202307142359
ISSUE DATE / TIME: 202306121709
Unit Type and Rh: 5100

## 2022-04-07 LAB — CBC
HCT: 26.2 % — ABNORMAL LOW (ref 36.0–46.0)
Hemoglobin: 8.1 g/dL — ABNORMAL LOW (ref 12.0–15.0)
MCH: 28.3 pg (ref 26.0–34.0)
MCHC: 30.9 g/dL (ref 30.0–36.0)
MCV: 91.6 fL (ref 80.0–100.0)
Platelets: 272 10*3/uL (ref 150–400)
RBC: 2.86 MIL/uL — ABNORMAL LOW (ref 3.87–5.11)
RDW: 15 % (ref 11.5–15.5)
WBC: 6.8 10*3/uL (ref 4.0–10.5)
nRBC: 0 % (ref 0.0–0.2)

## 2022-04-07 LAB — C4 COMPLEMENT: Complement C4, Body Fluid: 15 mg/dL (ref 12–38)

## 2022-04-07 LAB — C3 COMPLEMENT: C3 Complement: 114 mg/dL (ref 82–167)

## 2022-04-07 LAB — GLOMERULAR BASEMENT MEMBRANE ANTIBODIES: GBM Ab: <0.2 units (ref 0.0–0.9)

## 2022-04-07 MED ORDER — ALUM & MAG HYDROXIDE-SIMETH 200-200-20 MG/5ML PO SUSP
30.0000 mL | Freq: Four times a day (QID) | ORAL | Status: DC | PRN
Start: 2022-04-07 — End: 2022-04-13
  Administered 2022-04-07 – 2022-04-11 (×2): 30 mL via ORAL
  Filled 2022-04-07 (×2): qty 30

## 2022-04-07 NOTE — Progress Notes (Signed)
Patient ID: [name] [name], female   DOB: 04/09/1941, 81 y.o.   MRN: [id number] S: Had some SOB last night and lower extremity duplex + for left leg DVT of posterior tibial vein.  She has been started on heparin but no eliquis due to possible need for kidney biopsy. O:BP (!) 162/60 (BP Location: Right Arm)   Pulse 69   Temp 98.3 F (36.8 C) (Oral)   Resp 15   Ht '5\' 5"'$  (1.651 m)   Wt 107.6 kg   SpO2 95%   BMI 39.47 kg/m   Intake/Output Summary (Last 24 hours) at 04/07/2022 0900 Last data filed at 04/07/2022 0617 Gross per 24 hour  Intake 1429.95 ml  Output 200 ml  Net 1229.95 ml   Intake/Output: I/O last 3 completed shifts: In: 2599.7 [P.O.:480; I.V.:1887.7; Blood:232] Out: 384 [Urine:540]  Intake/Output this shift:  No intake/output data recorded. Weight change:  Gen:NAD CVS: RRR Resp:CTA Abd: +BS, soft, NT/ND Ext: trace pretibial edema L>R  Recent Labs  Lab 04/05/22 1317 04/06/22 0444 04/07/22 0514  NA 139 139 139  K 4.0 4.1 3.8  CL 106 111 110  CO2 '26 27 24  '$ GLUCOSE 103* 102* 100*  BUN 40* 34* 32*  CREATININE 3.16* 2.99* 2.75*  ALBUMIN  --   --  2.7*  CALCIUM 9.9 9.2 9.1  PHOS  --   --  3.2   Liver Function Tests: Recent Labs  Lab 04/07/22 0514  ALBUMIN 2.7*   No results for input(s): "LIPASE", "AMYLASE" in the last 168 hours. No results for input(s): "AMMONIA" in the last 168 hours. CBC: Recent Labs  Lab 04/05/22 1317 04/06/22 0444 04/07/22 0514  WBC 5.6 5.3 6.8  HGB 9.3* 7.6* 8.1*  HCT 28.9* 24.2* 26.2*  MCV 91.2 92.4 91.6  PLT 278 284 272   Cardiac Enzymes: No results for input(s): "CKTOTAL", "CKMB", "CKMBINDEX", "TROPONINI" in the last 168 hours. CBG: No results for input(s): "GLUCAP" in the last 168 hours.  Iron Studies: No results for input(s): "IRON", "TIBC", "TRANSFERRIN", "FERRITIN" in the last 72 hours. Studies/Results: ECHOCARDIOGRAM COMPLETE  Result Date: 04/06/2022    ECHOCARDIOGRAM REPORT   Patient Name:   [name] [name]  Date of Exam: 04/06/2022 Medical Rec #:  [id number]         Height:       65.0 in Accession #:    [id number]        Weight:       237.2 lb Date of Birth:  03/30/1941         BSA:          2.127 m Patient Age:    80 years          BP:           182/72 mmHg Patient Gender: F                 HR:           76 bpm. Exam Location:  [facility] Procedure: 2D Echo, Cardiac Doppler and Color Doppler Indications:    Dyspnea  History:        Patient has no prior history of Echocardiogram examinations.                 Signs/Symptoms:Dyspnea; Risk Factors:Hypertension.  Sonographer:    Wenda Low Referring Phys: [place]  Sonographer Comments: Patient is morbidly obese. IMPRESSIONS  1. Left ventricular ejection fraction, by estimation, is 70 to 75%. Left ventricular ejection  fraction by 2D MOD biplane is 71.7 %. The left ventricle has hyperdynamic function. The left ventricle has no regional wall motion abnormalities. There is mild  left ventricular hypertrophy. Left ventricular diastolic parameters are consistent with Grade I diastolic dysfunction (impaired relaxation).  2. Right ventricular systolic function is normal. The right ventricular size is normal. There is normal pulmonary artery systolic pressure. The estimated right ventricular systolic pressure is 77.4 mmHg.  3. Left atrial size was moderately dilated.  4. The mitral valve is abnormal. Trivial mitral valve regurgitation.  5. The aortic valve is tricuspid. Aortic valve regurgitation is not visualized. Mild aortic valve stenosis. Aortic valve area, by VTI measures 1.72 cm. Aortic valve mean gradient measures 12.0 mmHg. Aortic valve Vmax measures 2.47 m/s.  6. The inferior vena cava is normal in size with greater than 50% respiratory variability, suggesting right atrial pressure of 3 mmHg. Comparison(s): No prior Echocardiogram. FINDINGS  Left Ventricle: Left ventricular ejection fraction, by estimation, is 70 to 75%. Left ventricular ejection fraction  by 2D MOD biplane is 71.7 %. The left ventricle has hyperdynamic function. The left ventricle has no regional wall motion abnormalities. The left ventricular internal cavity size was normal in size. There is mild left ventricular hypertrophy. Left ventricular diastolic parameters are consistent with Grade I diastolic dysfunction (impaired relaxation). Indeterminate filling pressures. Right Ventricle: The right ventricular size is normal. No increase in right ventricular wall thickness. Right ventricular systolic function is normal. There is normal pulmonary artery systolic pressure. The tricuspid regurgitant velocity is 2.34 m/s, and  with an assumed right atrial pressure of 3 mmHg, the estimated right ventricular systolic pressure is 12.8 mmHg. Left Atrium: Left atrial size was moderately dilated. Right Atrium: Right atrial size was normal in size. Pericardium: There is no evidence of pericardial effusion. Mitral Valve: The mitral valve is abnormal. There is mild calcification of the anterior and posterior mitral valve leaflet(s). Trivial mitral valve regurgitation. MV peak gradient, 10.9 mmHg. The mean mitral valve gradient is 3.0 mmHg. Tricuspid Valve: The tricuspid valve is grossly normal. Tricuspid valve regurgitation is trivial. Aortic Valve: The aortic valve is tricuspid. Aortic valve regurgitation is not visualized. Mild aortic stenosis is present. Aortic valve mean gradient measures 12.0 mmHg. Aortic valve peak gradient measures 24.4 mmHg. Aortic valve area, by VTI measures 1.72 cm. Pulmonic Valve: The pulmonic valve was normal in structure. Pulmonic valve regurgitation is not visualized. Aorta: The aortic root and ascending aorta are structurally normal, with no evidence of dilitation. Venous: The inferior vena cava is normal in size with greater than 50% respiratory variability, suggesting right atrial pressure of 3 mmHg. IAS/Shunts: No atrial level shunt detected by color flow Doppler.  LEFT VENTRICLE  PLAX 2D                        Biplane EF (MOD) LVIDd:         4.80 cm         LV Biplane EF:   Left LVIDs:         2.80 cm                          ventricular LV PW:         1.30 cm                          ejection LV IVS:  1.30 cm                          fraction by LVOT diam:     2.00 cm                          2D MOD LV SV:         89                               biplane is LV SV Index:   42                               71.7 %. LVOT Area:     3.14 cm                                Diastology                                LV e' medial:    6.85 cm/s LV Volumes (MOD)               LV E/e' medial:  11.6 LV vol d, MOD    74.7 ml       LV e' lateral:   11.20 cm/s A2C:                           LV E/e' lateral: 7.1 LV vol d, MOD    70.5 ml A4C: LV vol s, MOD    20.1 ml A2C: LV vol s, MOD    21.5 ml A4C: LV SV MOD A2C:   54.6 ml LV SV MOD A4C:   70.5 ml LV SV MOD BP:    52.6 ml RIGHT VENTRICLE RV Basal diam:  3.35 cm RV Mid diam:    3.00 cm RV S prime:     14.90 cm/s TAPSE (M-mode): 3.3 cm LEFT ATRIUM             Index        RIGHT ATRIUM           Index LA diam:        4.10 cm 1.93 cm/m   RA Area:     18.90 cm LA Vol (A2C):   62.0 ml 29.15 ml/m  RA Volume:   53.10 ml  24.97 ml/m LA Vol (A4C):   81.6 ml 38.37 ml/m LA Biplane Vol: 76.8 ml 36.11 ml/m  AORTIC VALVE                     PULMONIC VALVE AV Area (Vmax):    1.97 cm      PV Vmax:       1.15 m/s AV Area (Vmean):   1.78 cm      PV Peak grad:  5.3 mmHg AV Area (VTI):     1.72 cm AV Vmax:           247.00 cm/s AV Vmean:          159.500 cm/s AV VTI:            0.516 m AV Peak Grad:  24.4 mmHg AV Mean Grad:      12.0 mmHg LVOT Vmax:         155.00 cm/s LVOT Vmean:        90.400 cm/s LVOT VTI:          0.283 m LVOT/AV VTI ratio: 0.55  AORTA Ao Root diam: 2.80 cm MITRAL VALVE                TRICUSPID VALVE MV Area (PHT): 3.21 cm     TR Peak grad:   21.9 mmHg MV Area VTI:   1.99 cm     TR Vmax:        234.00 cm/s MV Peak grad:  10.9 mmHg MV  Mean grad:  3.0 mmHg     SHUNTS MV Vmax:       1.65 m/s     Systemic VTI:  0.28 m MV Vmean:      83.0 cm/s    Systemic Diam: 2.00 cm MV Decel Time: 236 msec MV E velocity: 79.50 cm/s MV A velocity: 141.00 cm/s MV E/A ratio:  0.56 Lyman Bishop MD Electronically signed by Lyman Bishop MD Signature Date/Time: 04/06/2022/6:10:02 PM    Final    US Venous Img Lower Bilateral (DVT)  Result Date: 04/06/2022 CLINICAL DATA:  81 year old female with dyspnea for 2 weeks and swelling of the lower extremity EXAM: BILATERAL LOWER EXTREMITY VENOUS DOPPLER ULTRASOUND TECHNIQUE: Gray-scale sonography with graded compression, as well as color Doppler and duplex ultrasound were performed to evaluate the lower extremity deep venous systems from the level of the common femoral vein and including the common femoral, femoral, profunda femoral, popliteal and calf veins including the posterior tibial, peroneal and gastrocnemius veins when visible. The superficial great saphenous vein was also interrogated. Spectral Doppler was utilized to evaluate flow at rest and with distal augmentation maneuvers in the common femoral, femoral and popliteal veins. COMPARISON:  None Available. FINDINGS: RIGHT LOWER EXTREMITY Common Femoral Vein: No evidence of thrombus. Normal compressibility, respiratory phasicity and response to augmentation. Saphenofemoral Junction: No evidence of thrombus. Normal compressibility and flow on color Doppler imaging. Profunda Femoral Vein: No evidence of thrombus. Normal compressibility and flow on color Doppler imaging. Femoral Vein: No evidence of thrombus. Normal compressibility, respiratory phasicity and response to augmentation. Popliteal Vein: No evidence of thrombus. Normal compressibility, respiratory phasicity and response to augmentation. Calf Veins: No evidence of thrombus. Normal compressibility and flow on color Doppler imaging. Superficial Great Saphenous Vein: No evidence of thrombus. Normal  compressibility and flow on color Doppler imaging Other Findings:  None. LEFT LOWER EXTREMITY Common Femoral Vein: No evidence of thrombus. Normal compressibility, respiratory phasicity and response to augmentation. Saphenofemoral Junction: No evidence of thrombus. Normal compressibility and flow on color Doppler imaging. Profunda Femoral Vein: No evidence of thrombus. Normal compressibility and flow on color Doppler imaging. Femoral Vein: No evidence of thrombus. Normal compressibility, respiratory phasicity and response to augmentation. Popliteal Vein: No evidence of thrombus. Normal compressibility, respiratory phasicity and response to augmentation. Calf Veins: Occlusive thrombus of the posterior tibial vein, noncompressible. Peroneal vein patent and compressible with flow maintained. Anterior tibial vein patent. Superficial Great Saphenous Vein: No evidence of thrombus. Normal compressibility and flow on color Doppler imaging. Other Findings:  None. IMPRESSION: Directed duplex left lower extremity positive for distal DVT of the posterior tibial vein. Negative for proximal left DVT. Directed duplex right lower extremity negative for DVT. These results will be called to the ordering clinician or representative by the Radiologist  Environmental consultant, and communication documented in the PACS or [employer]. Signed, Dulcy Fanny. Nadene Rubins, RPVI [facility] Radiology Electronically Signed   By: Corrie Mckusick D.O.   On: 04/06/2022 12:00   US Renal  Result Date: 04/06/2022 CLINICAL DATA:  Acute renal injury EXAM: RENAL / URINARY TRACT ULTRASOUND COMPLETE COMPARISON:  None Available. FINDINGS: Right Kidney: Renal measurements: 10.7 x 5.1 x 4.9 cm = volume: 167 mL. Two small (1.4 cm) anechoic cysts. No hydronephrosis Left Kidney: Renal measurements: 11.4 x 7.1 x 5.6 cm = volume: 237 mL. Single small 2.3 cm anechoic cyst. No hydronephrosis. Bladder: Appears normal for  degree of bladder distention. Other: None. IMPRESSION: 1. No hydronephrosis. 2. Bilateral benign-appearing renal cysts. 3. Normal bladder. Electronically Signed   By: Suzy Bouchard M.D.   On: 04/06/2022 10:39   DG Chest 2 View  Result Date: 04/05/2022 CLINICAL DATA:  Pt c/o sob and weakness for several days. Denies cp, fever or n/v. HX of hypertension and CA. EXAM: CHEST - 2 VIEW COMPARISON:  01/08/2020. FINDINGS: Mild enlargement of the cardiopericardial silhouette. No mediastinal or hilar masses. No evidence of adenopathy. Clear lungs.  No pleural effusion or pneumothorax. Skeletal structures are intact. IMPRESSION: No acute cardiopulmonary disease. Electronically Signed   By: Lajean Manes M.D.   On: 04/05/2022 14:04    amLODipine  5 mg Oral Daily   anastrozole  1 mg Oral Daily   brimonidine  1 drop Both Eyes BID   And   timolol  1 drop Both Eyes BID   hydrALAZINE  100 mg Oral TID   pantoprazole  40 mg Oral Daily    BMET    Component Value Date/Time   NA 139 04/07/2022 0514   K 3.8 04/07/2022 0514   CL 110 04/07/2022 0514   CO2 24 04/07/2022 0514   GLUCOSE 100 (H) 04/07/2022 0514   BUN 32 (H) 04/07/2022 0514   BUN 24 (A) 03/17/2013 0000   CREATININE 2.75 (H) 04/07/2022 0514   CREATININE 1.03 03/17/2013 0000   CALCIUM 9.1 04/07/2022 0514   GFRNONAA 17 (L) 04/07/2022 0514   GFRAA >60 10/06/2018 1610   CBC    Component Value Date/Time   WBC 6.8 04/07/2022 0514   RBC 2.86 (L) 04/07/2022 0514   HGB 8.1 (L) 04/07/2022 0514   HGB 10.9 03/16/2013 0000   HCT 26.2 (L) 04/07/2022 0514   HCT 35 03/16/2013 0000   PLT 272 04/07/2022 0514   PLT 301 03/16/2013 0000   MCV 91.6 04/07/2022 0514   MCV 93.0 03/16/2013 0000   MCH 28.3 04/07/2022 0514   MCHC 30.9 04/07/2022 0514   RDW 15.0 04/07/2022 0514   LYMPHSABS 1.2 02/03/2010 1030   MONOABS 0.7 02/03/2010 1030   EOSABS 0.2 02/03/2010 1030   BASOSABS 0.0 02/03/2010 1030   Assessment/Plan:  AKI vs Subacute Kidney injury - new  proteinuria and microscopic hematuria with Scr first noted to be elevated in April 2023.  Renal US ordered.  Will start acute GN workup.  Scr slightly improved with IVF's.  No indication for dialysis at this time Renal US without hydronephrosis, Hepatitis panel negative, C4 15, C3 114, other serologies pending Renal function continues to improve.  Off of IVF's due to SOB but remains oliguric. Possible kidney biopsy pending serologies. Iron deficiency - Hgb dropping since admission.  Will check iron stores and replete.  Hold off on ESA for now.  Transfuse prn. HTN - not well controlled.  Currently on hydralazine and amlodipine.  Hold ACE/ARB for now.  Titrate dose to goal bp of 130/80 SOB/DOE - possibly due to anemia but should also check ECHO to r/o cardiac source. Recurrent breast cancer - s/p lumpectomy, XRT, and anastrozole.  Donetta Potts, MD [facility]

## 2022-04-07 NOTE — Progress Notes (Signed)
ANTICOAGULATION CONSULT NOTE - Initial Consult  Pharmacy Consult for Heparin Indication: [place] DVT  Allergies  Allergen Reactions   Ace Inhibitors Swelling   Sulfa Antibiotics Swelling and Rash    Patient Measurements: Height: '5\' 5"'$  (165.1 cm) Weight: 107.6 kg (237 lb 3.4 oz) IBW/kg (Calculated) : 57 Heparin Dosing Weight: 82.2 kg  Labs: Recent Labs    04/05/22 1317 04/06/22 0444 04/06/22 2310 04/07/22 0514 04/07/22 0742  HGB 9.3* 7.6*  --  8.1*  --   HCT 28.9* 24.2*  --  26.2*  --   PLT 278 284  --  272  --   HEPARINUNFRC  --   --  0.68  --  0.94*  CREATININE 3.16* 2.99*  --  2.75*  --      Estimated Creatinine Clearance: 20.5 mL/min (A) (by C-G formula based on SCr of 2.75 mg/dL (H)).  Assessment: 81 year old female admitted with AKI, bilateral lower extremity edema found to have LLE DVT on [place]. Pharmacy consulted to assist with systemic heparin anticoagulation. Heparin level trending upwards, repeat level is now supratherapeutic  Goal of Therapy:  Heparin level 0.3-0.7 units/ml Monitor platelets by anticoagulation protocol: Yes   Plan:  Decrease Heparin gtt to 1150 units/hr = 11.34m/hr Heparin level in 8 hours Daily heparin labs and CBC  ELorenso Courier PharmD Clinical Pharmacist 04/07/2022 9:23 AM

## 2022-04-07 NOTE — Assessment & Plan Note (Addendum)
-  Positive lower extremity Dopplers for left distal leg DVT -Heparin initiated. -At least 6 months of anticoagulation using Eliquis will be required at discharge -vasculitis and prior history of breast cancer are risk factors -continue IV heparin>>held for renal biopsy 6/15 -restarted [facility] 6/16>>monitor CBC>>Hgb remains stable D/c home with apixaban at least 6 months Hgb 8.4 on day of d/c

## 2022-04-07 NOTE — Progress Notes (Signed)
Progress Note   Patient: [name] [name] QIH:[id number] DOB: 08/04/1941 DOA: 04/05/2022     1 DOS: the patient was seen and examined on 04/07/2022   Brief hospital course: As per H&P written by Dr. Denton Brick on 04/05/2022 [name] [name] is a 81 y.o. female with medical history significant for breast cancer, anemia, hyperparathyroidism, hypertension. Patient presented to the ED with intermittent difficulty breathing over the past 2 weeks.  Symptoms are mostly with exertion.  She denies cough.  No chest pain.  She reports swelling of her lower extremities today.  She feels her abdomen is bloated.  Checks her weight regularly, she reports there has been no change. She denies vomiting, no loose stools.  She has maintained good oral intake.  Not on any diuretics.   ED Course:  Temp 97.6.  Heart rate 70s to 80s.  Respiratory rate 13-24.  Blood pressure systolic 875I to 433I. Creatinine elevated at 3.16, BNP 73.  Chest x-ray without acute abnormality. 1 L bolus given.  Renal ultrasound ordered.  Assessment and Plan: * AKI (acute kidney injury) ([place]) -Cr-3.16, per Care Everywhere last check 4/24 creatinine was 1.9.  -Cr down 2.75 after IVF's.  BUN 32 -Patient reported some increasing her urine output. -Intake and output report did not appear to be accurate. -Continue to avoid nephrotoxic agents -maintain adequate hydration -nephrology expressing concerns for possible GM -will follow results of initiated work up and follow renal function trend; patient might need renal biopsy. -renal US without hydronephrosis.   Left leg DVT (HCC) -Positive lower extremity Dopplers for left distal leg DVT -Heparin initiated. -At least 6 months of anticoagulation using Eliquis will be required at discharge -Sedentarism and prior history of breast cancer very likely increasing viscosity due to malignancy contributing to blood clots and might lead to longer therapy time.   Obesity, Class III, BMI 40-49.9  (morbid obesity) (HCC) -Body mass index is 39.47 kg/m. -Low calorie diet, portion control and increase activity discussed with patient.  Dyspnea -Dyspnea with exertion. -Possible associated with anemia -1 unit PRBCs transfused  -2D echo demonstrating grade 1 diastolic dysfunction, left ventricle with hyperdynamic function, no wall motion abnormalities, ejection fraction 70 to 75%.  No significant valvular disorder.   -BNP very likely falsely low in the setting of morbid obesity.   -Lower extremity Doppler with positive left leg DVT; therapy with anticoagulation will be initiated.   -Maintain adequate hydration and follow clinical response.     HTN (hypertension) -Systolic 951O to 841Y at time of admission. -Continue the use of hydralazine and adjust the dose of Norvasc. -Blood pressure currently in the 140s range. -Heart healthy/low-sodium diet discussed with patient.  Malignant neoplasm of upper outer quadrant of female breast ([place]) -Status post lumpectomy, radiation treatment, currently on anastrozole. -Continue patient follow-up with oncology service.  Anemia, iron deficiency -In the setting of anemia chronic kidney disease -No overt bleeding appreciated -Given symptomatic symptoms after discussing with nephrology service 1 unit of PRBCs transfused -IV iron and Epogen therapy as per renal discretion. -Current hemoglobin after transfusion 8.1.   Subjective:  No requiring oxygen supplementation; denies chest pain, nausea, vomiting and shortness of breath.  Feeling better overall.  And expressing increase in urine output.  Physical Exam: Vitals:   04/06/22 1918 04/06/22 1930 04/06/22 2043 04/07/22 0502  BP:  (!) 172/50 (!) 151/45 (!) 162/60  Pulse:  80 78 69  Resp:  '18 16 15  '$ Temp: 99.6 F (37.6 C) 98.9 F (37.2 C) 98.1 F (  36.7 C) 98.3 F (36.8 C)  TempSrc: Axillary Axillary Oral Oral  SpO2:  96% 96% 95%  Weight:      Height:       General exam: Alert, awake,  oriented x 3; no chest pain, no shortness of breath, no requiring oxygen supplementation.  Reports no nausea or vomiting. Respiratory system: Good air movement bilaterally; no wheezing or crackles. Cardiovascular system:RRR. No murmurs, rubs, gallops.  Unable to assess JVD with body habitus Gastrointestinal system: Abdomen is obese, nondistended, soft and nontender. No organomegaly or masses felt. Normal bowel sounds heard. Central nervous system: Alert and oriented. No focal neurological deficits. Extremities: No cyanosis or clubbing; 2-3+ edema bilaterally appreciated on exam. Skin: No petechiae. Psychiatry: Judgement and insight appear normal. Mood & affect appropriate.   Data Reviewed: CBC: Demonstrating WBCs of 6.8, hemoglobin 8.1 and platelet count 272 K Renal function panel: Sodium 139, potassium 3.8, bicarb 24, BUN 32, creatinine 2.75 GFR 17 currently.  Family Communication: Daughters at bedside.  Disposition: Status is: Inpatient Remains inpatient appropriate because: Renal function is still not back to baseline; work-up in progress by nephrology service.  Positive left lower extremity DVT initiating treatment with heparin.   Planned Discharge Destination: Home   Author: Barton Dubois, MD 04/07/2022 11:44 AM  For on call review [email or web address].

## 2022-04-07 NOTE — Progress Notes (Signed)
ANTICOAGULATION CONSULT NOTE   Pharmacy Consult for Heparin Indication: LLE DVT  Allergies  Allergen Reactions   Ace Inhibitors Swelling   Sulfa Antibiotics Swelling and Rash    Patient Measurements: Height: '5\' 5"'$  (165.1 cm) Weight: 107.6 kg (237 lb 3.4 oz) IBW/kg (Calculated) : 57 Heparin Dosing Weight: 82.2 kg  Labs: Recent Labs    04/05/22 1317 04/06/22 0444 04/06/22 2310  HGB 9.3* 7.6*  --   HCT 28.9* 24.2*  --   PLT 278 284  --   HEPARINUNFRC  --   --  0.68  CREATININE 3.16* 2.99*  --      Estimated Creatinine Clearance: 18.9 mL/min (A) (by C-G formula based on SCr of 2.99 mg/dL (H)).  Assessment: 81 year old female admitted with AKI, bilateral lower extremity edema found to have LLE DVT on [place]. Pharmacy consulted to assist with systemic heparin anticoagulation.   Heparin level 0.68 on 1300 units/hr; no infusion issues or overt bleeding reported; noted Hgb down 9.3>7.6 from admission.  Goal of Therapy:  Heparin level 0.3-0.7 units/ml Monitor platelets by anticoagulation protocol: Yes   Plan:  Continue heparin gtt at 1300 units/hr = 13 ml/hr Heparin level in 8 hours Daily heparin labs and CBC  Georga Bora, PharmD Clinical Pharmacist 04/07/2022 12:48 AM Please check AMION for all [place] numbers

## 2022-04-07 NOTE — Progress Notes (Signed)
ANTICOAGULATION CONSULT NOTE - Initial Consult  Pharmacy Consult for Heparin Indication:  LLE DVT 04/06/22  Allergies  Allergen Reactions   Ace Inhibitors Swelling   Sulfa Antibiotics Swelling and Rash    Patient Measurements: Height: '5\' 5"'$  (165.1 cm) Weight: 107.6 kg (237 lb 3.4 oz) IBW/kg (Calculated) : 57 Heparin Dosing Weight: 82.2 kg  Labs: Recent Labs    04/05/22 1317 04/06/22 0444 04/06/22 2310 04/07/22 0514 04/07/22 0742 04/07/22 2031  HGB 9.3* 7.6*  --  8.1*  --   --   HCT 28.9* 24.2*  --  26.2*  --   --   PLT 278 284  --  272  --   --   HEPARINUNFRC  --   --  0.68  --  0.94* 0.72*  CREATININE 3.16* 2.99*  --  2.75*  --   --     Estimated Creatinine Clearance: 20.5 mL/min (A) (by C-G formula based on SCr of 2.75 mg/dL (H)).  Assessment: 81 year old W  found to have LLE DVT 04/06/22. PMH significant for anemia, breast cancer and obesity. No anticoagulation prior to admission. Pharmacy consulted for heparin.    Heparin level 0.72 is just supratherapeutic on 1150 units/hr. H/H, plt stable.    Goal of Therapy:  Heparin level 0.3-0.7 units/ml Monitor platelets by anticoagulation protocol: Yes   Plan:  Decrease Heparin gtt to 1050 units/hr  Monitor daily heparin level, CBC Monitor for signs/symptoms of bleeding   F/u longterm anticoagulation plan   Benetta Spar, PharmD, BCPS, [facility] Clinical Pharmacist  Please check AMION for all [place] phone numbers After 10:00 PM, call [place]

## 2022-04-08 DIAGNOSIS — C50411 Malignant neoplasm of upper-outer quadrant of right female breast: Secondary | ICD-10-CM

## 2022-04-08 DIAGNOSIS — I82402 Acute embolism and thrombosis of unspecified deep veins of left lower extremity: Secondary | ICD-10-CM

## 2022-04-08 DIAGNOSIS — N179 Acute kidney failure, unspecified: Secondary | ICD-10-CM | POA: Diagnosis not present

## 2022-04-08 LAB — PROTEIN ELECTROPHORESIS, SERUM
A/G Ratio: 0.7 (ref 0.7–1.7)
Albumin ELP: 2.8 g/dL — ABNORMAL LOW (ref 2.9–4.4)
Alpha-1-Globulin: 0.3 g/dL (ref 0.0–0.4)
Alpha-2-Globulin: 0.7 g/dL (ref 0.4–1.0)
Beta Globulin: 0.9 g/dL (ref 0.7–1.3)
Gamma Globulin: 2.3 g/dL — ABNORMAL HIGH (ref 0.4–1.8)
Globulin, Total: 4.1 g/dL — ABNORMAL HIGH (ref 2.2–3.9)
Total Protein ELP: 6.9 g/dL (ref 6.0–8.5)

## 2022-04-08 LAB — CBC
HCT: 25.7 % — ABNORMAL LOW (ref 36.0–46.0)
Hemoglobin: 8.1 g/dL — ABNORMAL LOW (ref 12.0–15.0)
MCH: 28.6 pg (ref 26.0–34.0)
MCHC: 31.5 g/dL (ref 30.0–36.0)
MCV: 90.8 fL (ref 80.0–100.0)
Platelets: 272 10*3/uL (ref 150–400)
RBC: 2.83 MIL/uL — ABNORMAL LOW (ref 3.87–5.11)
RDW: 14.7 % (ref 11.5–15.5)
WBC: 6.3 10*3/uL (ref 4.0–10.5)
nRBC: 0 % (ref 0.0–0.2)

## 2022-04-08 LAB — ANCA TITERS
Atypical P-ANCA titer: <1:20 {titer}
C-ANCA: <1:20 {titer}
P-ANCA: 1:640 {titer} — ABNORMAL HIGH

## 2022-04-08 LAB — RENAL FUNCTION PANEL
Albumin: 2.6 g/dL — ABNORMAL LOW (ref 3.5–5.0)
Anion gap: 5 (ref 5–15)
BUN: 31 mg/dL — ABNORMAL HIGH (ref 8–23)
CO2: 24 mmol/L (ref 22–32)
Calcium: 9.2 mg/dL (ref 8.9–10.3)
Chloride: 107 mmol/L (ref 98–111)
Creatinine, Ser: 2.79 mg/dL — ABNORMAL HIGH (ref 0.44–1.00)
GFR, Estimated: 17 mL/min — ABNORMAL LOW (ref >60)
Glucose, Bld: 106 mg/dL — ABNORMAL HIGH (ref 70–99)
Phosphorus: 3 mg/dL (ref 2.5–4.6)
Potassium: 3.8 mmol/L (ref 3.5–5.1)
Sodium: 136 mmol/L (ref 135–145)

## 2022-04-08 LAB — HEPARIN LEVEL (UNFRACTIONATED)
Heparin Unfractionated: 0.99 IU/mL — ABNORMAL HIGH (ref 0.30–0.70)
Heparin Unfractionated: 0.37 IU/mL (ref 0.30–0.70)

## 2022-04-08 LAB — COMPLEMENT, TOTAL: Compl, Total (CH50): 57 U/mL (ref >41)

## 2022-04-08 MED ORDER — PREDNISONE 20 MG PO TABS
60.0000 mg | ORAL_TABLET | Freq: Every day | ORAL | Status: DC
Start: 2022-04-08 — End: 2022-04-13
  Administered 2022-04-08 – 2022-04-13 (×6): 60 mg via ORAL
  Filled 2022-04-08 (×6): qty 3

## 2022-04-08 MED ORDER — AMLODIPINE BESYLATE 5 MG PO TABS
10.0000 mg | ORAL_TABLET | Freq: Every day | ORAL | Status: DC
  Administered 2022-04-09 – 2022-04-13 (×5): 10 mg via ORAL
  Filled 2022-04-08 (×5): qty 2

## 2022-04-08 MED ORDER — LABETALOL HCL 5 MG/ML IV SOLN
5.0000 mg | Freq: Four times a day (QID) | INTRAVENOUS | Status: DC | PRN
  Administered 2022-04-08 – 2022-04-10 (×2): 5 mg via INTRAVENOUS
  Filled 2022-04-08 (×2): qty 4

## 2022-04-08 MED ORDER — AMLODIPINE BESYLATE 5 MG PO TABS
5.0000 mg | ORAL_TABLET | Freq: Once | ORAL | Status: AC
  Administered 2022-04-08: 5 mg via ORAL
  Filled 2022-04-08: qty 1

## 2022-04-08 NOTE — Progress Notes (Signed)
PROGRESS NOTE  [name] [name] IEP:[id number] DOB: May 18, 1941 DOA: 04/05/2022 PCP: Monico Blitz, MD  Brief History:  As per H&P written by Dr. Denton Brick on 04/05/2022 [name] [name] is a 81 y.o. female with medical history significant for recurrent breast cancer, Fe deficiency anemia, hyperparathyroidism, hypertension. Patient presented to the ED with intermittent difficulty breathing over the past 2 weeks.  Symptoms are mostly with exertion.  She denies cough.  No chest pain.  She reports swelling of her lower extremities today.  She feels her abdomen is bloated.  Checks her weight regularly, she reports there has been no change. She denies vomiting, no loose stools.  She has maintained good oral intake.  Not on any diuretics.   ED Course:  Temp 97.6.  Heart rate 70s to 80s.  Respiratory rate 13-24.  Blood pressure systolic 660Y to 301S. Creatinine elevated at 3.16, BNP 73.  Chest x-ray without acute abnormality. 1 L bolus given.  Renal ultrasound ordered.    Assessment and Plan: * AKI (acute kidney injury) ([place]) -Cr-3.16, per Care Everywhere last check 4/24 creatinine was 1.9.  -Cr down 2.75 after IVF's.  BUN 32 -appreciate renal  -ANA and anti-dsDNA positive -renal US without hydronephrosis. 04/09/22--planning renal biopsy -antiGBM, C3, C4 neg -empiric prednisone started 6/14   Left leg DVT (HCC) -Positive lower extremity Dopplers for left distal leg DVT -Heparin initiated. -At least 6 months of anticoagulation using Eliquis will be required at discharge -Sedentarism and prior history of breast cancer are risk factors -continue IV heparin -restart AC when cleared by renal service  Obesity, Class III, BMI 40-49.9 (morbid obesity) ([place]) -Body mass index is 39.47 kg/m. -Lifestyle modification  Dyspnea -Dyspnea with exertion. -suspect symptomatic anemia -1 unit PRBCs transfused >>dyspnea improved; stable on RA -04/06/22 echo G1DD LV with hyperdynamic function,  no wall motion abnormalities, ejection fraction 70 to 75%.  No significant valvular disorder.   -Lower extremity Doppler with positive left leg DVT; therapy with anticoagulation will be initiated.      HTN (hypertension) -Systolic 010X to 323F at time of admission. -Continue amlodipine -hydralazine discontinued due to concern for drug induced lupus  Malignant neoplasm of upper outer quadrant of female breast ([place]) -Status post lumpectomy, radiation treatment, currently on anastrozole. -Continue patient follow-up with oncology service.  Anemia, iron deficiency -In the setting of anemia chronic kidney disease -No overt bleeding appreciated -Given symptomatic symptoms after discussing with nephrology service 1 unit of PRBCs transfused -IV iron and Epogen therapy as per renal discretion. -Current hemoglobin after transfusion 8.1.        Family Communication:   daughter updated 6/14  Consultants:  renal, IR  Code Status:  FULL   DVT Prophylaxis: IV Heparin    Procedures: As Listed in Progress Note Above  Antibiotics: None      Subjective: Patient denies fevers, chills, headache, chest pain, dyspnea, nausea, vomiting, diarrhea, abdominal pain, dysuria, hematuria, hematochezia, and melena.   Objective: Vitals:   04/07/22 1512 04/07/22 2016 04/08/22 0409 04/08/22 1457  BP: (!) 144/56 (!) 187/61 (!) 159/56 (!) 157/60  Pulse: 66 77 76 66  Resp: '20 20 18 18  '$ Temp: 98.9 F (37.2 C) 98.2 F (36.8 C) 98.8 F (37.1 C) 98.4 F (36.9 C)  TempSrc:  Oral Oral Oral  SpO2: 95% 96% 93% 92%  Weight:      Height:        Intake/Output Summary (Last 24 hours) at 04/08/2022  [place] filed at 04/08/2022 1656 Gross per 24 hour  Intake 636.99 ml  Output 1600 ml  Net -963.01 ml   Weight change:  Exam:  General:  Pt is alert, follows commands appropriately, not in acute distress HEENT: No icterus, No thrush, No neck mass, [place]/AT Cardiovascular: RRR, S1/S2, no rubs, no  gallops Respiratory: bibasilar crackles. No wheeze Abdomen: Soft/+BS, non tender, non distended, no guarding Extremities:  ! + LE edema, No lymphangitis, No petechiae, No rashes, no synovitis   Data Reviewed: I have personally reviewed following labs and imaging studies Basic Metabolic Panel: Recent Labs  Lab 04/05/22 1317 04/06/22 0444 04/07/22 0514 04/08/22 0452  NA 139 139 139 136  K 4.0 4.1 3.8 3.8  CL 106 111 110 107  CO2 '26 27 24 24  '$ GLUCOSE 103* 102* 100* 106*  BUN 40* 34* 32* 31*  CREATININE 3.16* 2.99* 2.75* 2.79*  CALCIUM 9.9 9.2 9.1 9.2  PHOS  --   --  3.2 3.0   Liver Function Tests: Recent Labs  Lab 04/07/22 0514 04/08/22 0452  ALBUMIN 2.7* 2.6*   No results for input(s): "LIPASE", "AMYLASE" in the last 168 hours. No results for input(s): "AMMONIA" in the last 168 hours. Coagulation Profile: No results for input(s): "INR", "PROTIME" in the last 168 hours. CBC: Recent Labs  Lab 04/05/22 1317 04/06/22 0444 04/07/22 0514 04/08/22 0452  WBC 5.6 5.3 6.8 6.3  HGB 9.3* 7.6* 8.1* 8.1*  HCT 28.9* 24.2* 26.2* 25.7*  MCV 91.2 92.4 91.6 90.8  PLT 278 284 272 272   Cardiac Enzymes: No results for input(s): "CKTOTAL", "CKMB", "CKMBINDEX", "TROPONINI" in the last 168 hours. BNP: Invalid input(s): "POCBNP" CBG: No results for input(s): "GLUCAP" in the last 168 hours. HbA1C: No results for input(s): "HGBA1C" in the last 72 hours. Urine analysis:    Component Value Date/Time   COLORURINE STRAW (A) 04/05/2022 1650   APPEARANCEUR CLEAR 04/05/2022 1650   LABSPEC 1.008 04/05/2022 1650   PHURINE 8.0 04/05/2022 1650   GLUCOSEU NEGATIVE 04/05/2022 1650   HGBUR MODERATE (A) 04/05/2022 1650   BILIRUBINUR NEGATIVE 04/05/2022 1650   KETONESUR NEGATIVE 04/05/2022 1650   PROTEINUR 100 (A) 04/05/2022 1650   UROBILINOGEN 0.2 02/03/2010 1200   NITRITE NEGATIVE 04/05/2022 1650   LEUKOCYTESUR NEGATIVE 04/05/2022 1650   Sepsis  Labs: '@LABRCNTIP'$ (procalcitonin:4,lacticidven:4) )No results found for this or any previous visit (from the past 240 hour(s)).   Scheduled Meds:  [START ON 04/09/2022] amLODipine  10 mg Oral Daily   amLODipine  5 mg Oral Once   anastrozole  1 mg Oral Daily   brimonidine  1 drop Both Eyes BID   And   timolol  1 drop Both Eyes BID   pantoprazole  40 mg Oral Daily   predniSONE  60 mg Oral Daily   Continuous Infusions:  heparin 1,000 Units/hr (04/08/22 1656)    Procedures/Studies: ECHOCARDIOGRAM COMPLETE  Result Date: 04/06/2022    ECHOCARDIOGRAM REPORT   Patient Name:   [name] [name] Date of Exam: 04/06/2022 Medical Rec #:  [id number]         Height:       65.0 in Accession #:    [id number]        Weight:       237.2 lb Date of Birth:  09/07/1941         BSA:          2.127 m Patient Age:    56 years  BP:           182/72 mmHg Patient Gender: F                 HR:           76 bpm. Exam Location:  [facility] Procedure: 2D Echo, Cardiac Doppler and Color Doppler Indications:    Dyspnea  History:        Patient has no prior history of Echocardiogram examinations.                 Signs/Symptoms:Dyspnea; Risk Factors:Hypertension.  Sonographer:    Wenda Low Referring Phys: [place]  Sonographer Comments: Patient is morbidly obese. IMPRESSIONS  1. Left ventricular ejection fraction, by estimation, is 70 to 75%. Left ventricular ejection fraction by 2D MOD biplane is 71.7 %. The left ventricle has hyperdynamic function. The left ventricle has no regional wall motion abnormalities. There is mild  left ventricular hypertrophy. Left ventricular diastolic parameters are consistent with Grade I diastolic dysfunction (impaired relaxation).  2. Right ventricular systolic function is normal. The right ventricular size is normal. There is normal pulmonary artery systolic pressure. The estimated right ventricular systolic pressure is 13.0 mmHg.  3. Left atrial size was moderately dilated.   4. The mitral valve is abnormal. Trivial mitral valve regurgitation.  5. The aortic valve is tricuspid. Aortic valve regurgitation is not visualized. Mild aortic valve stenosis. Aortic valve area, by VTI measures 1.72 cm. Aortic valve mean gradient measures 12.0 mmHg. Aortic valve Vmax measures 2.47 m/s.  6. The inferior vena cava is normal in size with greater than 50% respiratory variability, suggesting right atrial pressure of 3 mmHg. Comparison(s): No prior Echocardiogram. FINDINGS  Left Ventricle: Left ventricular ejection fraction, by estimation, is 70 to 75%. Left ventricular ejection fraction by 2D MOD biplane is 71.7 %. The left ventricle has hyperdynamic function. The left ventricle has no regional wall motion abnormalities. The left ventricular internal cavity size was normal in size. There is mild left ventricular hypertrophy. Left ventricular diastolic parameters are consistent with Grade I diastolic dysfunction (impaired relaxation). Indeterminate filling pressures. Right Ventricle: The right ventricular size is normal. No increase in right ventricular wall thickness. Right ventricular systolic function is normal. There is normal pulmonary artery systolic pressure. The tricuspid regurgitant velocity is 2.34 m/s, and  with an assumed right atrial pressure of 3 mmHg, the estimated right ventricular systolic pressure is 86.5 mmHg. Left Atrium: Left atrial size was moderately dilated. Right Atrium: Right atrial size was normal in size. Pericardium: There is no evidence of pericardial effusion. Mitral Valve: The mitral valve is abnormal. There is mild calcification of the anterior and posterior mitral valve leaflet(s). Trivial mitral valve regurgitation. MV peak gradient, 10.9 mmHg. The mean mitral valve gradient is 3.0 mmHg. Tricuspid Valve: The tricuspid valve is grossly normal. Tricuspid valve regurgitation is trivial. Aortic Valve: The aortic valve is tricuspid. Aortic valve regurgitation is not  visualized. Mild aortic stenosis is present. Aortic valve mean gradient measures 12.0 mmHg. Aortic valve peak gradient measures 24.4 mmHg. Aortic valve area, by VTI measures 1.72 cm. Pulmonic Valve: The pulmonic valve was normal in structure. Pulmonic valve regurgitation is not visualized. Aorta: The aortic root and ascending aorta are structurally normal, with no evidence of dilitation. Venous: The inferior vena cava is normal in size with greater than 50% respiratory variability, suggesting right atrial pressure of 3 mmHg. IAS/Shunts: No atrial level shunt detected by color flow Doppler.  LEFT VENTRICLE PLAX  2D                        Biplane EF (MOD) LVIDd:         4.80 cm         LV Biplane EF:   Left LVIDs:         2.80 cm                          ventricular LV PW:         1.30 cm                          ejection LV IVS:        1.30 cm                          fraction by LVOT diam:     2.00 cm                          2D MOD LV SV:         89                               biplane is LV SV Index:   42                               71.7 %. LVOT Area:     3.14 cm                                Diastology                                LV e' medial:    6.85 cm/s LV Volumes (MOD)               LV E/e' medial:  11.6 LV vol d, MOD    74.7 ml       LV e' lateral:   11.20 cm/s A2C:                           LV E/e' lateral: 7.1 LV vol d, MOD    70.5 ml A4C: LV vol s, MOD    20.1 ml A2C: LV vol s, MOD    21.5 ml A4C: LV SV MOD A2C:   54.6 ml LV SV MOD A4C:   70.5 ml LV SV MOD BP:    52.6 ml RIGHT VENTRICLE RV Basal diam:  3.35 cm RV Mid diam:    3.00 cm RV S prime:     14.90 cm/s TAPSE (M-mode): 3.3 cm LEFT ATRIUM             Index        RIGHT ATRIUM           Index LA diam:        4.10 cm 1.93 cm/m   RA Area:     18.90 cm LA Vol (A2C):   62.0 ml 29.15 ml/m  RA Volume:   53.10 ml  24.97 ml/m LA Vol (A4C):  81.6 ml 38.37 ml/m LA Biplane Vol: 76.8 ml 36.11 ml/m  AORTIC VALVE                     PULMONIC VALVE  AV Area (Vmax):    1.97 cm      PV Vmax:       1.15 m/s AV Area (Vmean):   1.78 cm      PV Peak grad:  5.3 mmHg AV Area (VTI):     1.72 cm AV Vmax:           247.00 cm/s AV Vmean:          159.500 cm/s AV VTI:            0.516 m AV Peak Grad:      24.4 mmHg AV Mean Grad:      12.0 mmHg LVOT Vmax:         155.00 cm/s LVOT Vmean:        90.400 cm/s LVOT VTI:          0.283 m LVOT/AV VTI ratio: 0.55  AORTA Ao Root diam: 2.80 cm MITRAL VALVE                TRICUSPID VALVE MV Area (PHT): 3.21 cm     TR Peak grad:   21.9 mmHg MV Area VTI:   1.99 cm     TR Vmax:        234.00 cm/s MV Peak grad:  10.9 mmHg MV Mean grad:  3.0 mmHg     SHUNTS MV Vmax:       1.65 m/s     Systemic VTI:  0.28 m MV Vmean:      83.0 cm/s    Systemic Diam: 2.00 cm MV Decel Time: 236 msec MV E velocity: 79.50 cm/s MV A velocity: 141.00 cm/s MV E/A ratio:  0.56 Lyman Bishop MD Electronically signed by Lyman Bishop MD Signature Date/Time: 04/06/2022/6:10:02 PM    Final    US Venous Img Lower Bilateral (DVT)  Result Date: 04/06/2022 CLINICAL DATA:  81 year old female with dyspnea for 2 weeks and swelling of the lower extremity EXAM: BILATERAL LOWER EXTREMITY VENOUS DOPPLER ULTRASOUND TECHNIQUE: Gray-scale sonography with graded compression, as well as color Doppler and duplex ultrasound were performed to evaluate the lower extremity deep venous systems from the level of the common femoral vein and including the common femoral, femoral, profunda femoral, popliteal and calf veins including the posterior tibial, peroneal and gastrocnemius veins when visible. The superficial great saphenous vein was also interrogated. Spectral Doppler was utilized to evaluate flow at rest and with distal augmentation maneuvers in the common femoral, femoral and popliteal veins. COMPARISON:  None Available. FINDINGS: RIGHT LOWER EXTREMITY Common Femoral Vein: No evidence of thrombus. Normal compressibility, respiratory phasicity and response to augmentation.  Saphenofemoral Junction: No evidence of thrombus. Normal compressibility and flow on color Doppler imaging. Profunda Femoral Vein: No evidence of thrombus. Normal compressibility and flow on color Doppler imaging. Femoral Vein: No evidence of thrombus. Normal compressibility, respiratory phasicity and response to augmentation. Popliteal Vein: No evidence of thrombus. Normal compressibility, respiratory phasicity and response to augmentation. Calf Veins: No evidence of thrombus. Normal compressibility and flow on color Doppler imaging. Superficial Great Saphenous Vein: No evidence of thrombus. Normal compressibility and flow on color Doppler imaging Other Findings:  None. LEFT LOWER EXTREMITY Common Femoral Vein: No evidence of thrombus. Normal compressibility, respiratory phasicity and response to augmentation. Saphenofemoral Junction: No evidence of  thrombus. Normal compressibility and flow on color Doppler imaging. Profunda Femoral Vein: No evidence of thrombus. Normal compressibility and flow on color Doppler imaging. Femoral Vein: No evidence of thrombus. Normal compressibility, respiratory phasicity and response to augmentation. Popliteal Vein: No evidence of thrombus. Normal compressibility, respiratory phasicity and response to augmentation. Calf Veins: Occlusive thrombus of the posterior tibial vein, noncompressible. Peroneal vein patent and compressible with flow maintained. Anterior tibial vein patent. Superficial Great Saphenous Vein: No evidence of thrombus. Normal compressibility and flow on color Doppler imaging. Other Findings:  None. IMPRESSION: Directed duplex left lower extremity positive for distal DVT of the posterior tibial vein. Negative for proximal left DVT. Directed duplex right lower extremity negative for DVT. These results will be called to the ordering clinician or representative by the Radiologist Assistant, and communication documented in the PACS or [employer]. Signed, Dulcy Fanny.  Nadene Rubins, RPVI [facility] Electronically Signed   By: Corrie Mckusick D.O.   On: 04/06/2022 12:00   US Renal  Result Date: 04/06/2022 CLINICAL DATA:  Acute renal injury EXAM: RENAL / URINARY TRACT ULTRASOUND COMPLETE COMPARISON:  None Available. FINDINGS: Right Kidney: Renal measurements: 10.7 x 5.1 x 4.9 cm = volume: 167 mL. Two small (1.4 cm) anechoic cysts. No hydronephrosis Left Kidney: Renal measurements: 11.4 x 7.1 x 5.6 cm = volume: 237 mL. Single small 2.3 cm anechoic cyst. No hydronephrosis. Bladder: Appears normal for degree of bladder distention. Other: None. IMPRESSION: 1. No hydronephrosis. 2. Bilateral benign-appearing renal cysts. 3. Normal bladder. Electronically Signed   By: Suzy Bouchard M.D.   On: 04/06/2022 10:39   DG Chest 2 View  Result Date: 04/05/2022 CLINICAL DATA:  Pt c/o sob and weakness for several days. Denies cp, fever or n/v. HX of hypertension and CA. EXAM: CHEST - 2 VIEW COMPARISON:  01/08/2020. FINDINGS: Mild enlargement of the cardiopericardial silhouette. No mediastinal or hilar masses. No evidence of adenopathy. Clear lungs.  No pleural effusion or pneumothorax. Skeletal structures are intact. IMPRESSION: No acute cardiopulmonary disease. Electronically Signed   By: Lajean Manes M.D.   On: 04/05/2022 14:04    Orson Eva, DO  [facility]  If 7PM-7AM, please contact night-coverage [email or web address] Password [email or web address] 04/08/2022, 5:27 PM   LOS: 2 days

## 2022-04-08 NOTE — Progress Notes (Signed)
ANTICOAGULATION CONSULT NOTE - follow-up  Pharmacy Consult for Heparin Indication:  LLE DVT 04/06/22  Allergies  Allergen Reactions   Ace Inhibitors Swelling   Sulfa Antibiotics Swelling and Rash    Patient Measurements: Height: '5\' 5"'$  (165.1 cm) Weight: 107.6 kg (237 lb 3.4 oz) IBW/kg (Calculated) : 57 Heparin Dosing Weight: 82.2 kg  Labs: Recent Labs    04/06/22 0444 04/06/22 2310 04/07/22 0514 04/07/22 0742 04/07/22 2031 04/08/22 0452 04/08/22 1920  HGB 7.6*  --  8.1*  --   --  8.1*  --   HCT 24.2*  --  26.2*  --   --  25.7*  --   PLT 284  --  272  --   --  272  --   HEPARINUNFRC  --    < >  --    < > 0.72* 0.99* 0.37  CREATININE 2.99*  --  2.75*  --   --  2.79*  --    < > = values in this interval not displayed.     Estimated Creatinine Clearance: 20.3 mL/min (A) (by C-G formula based on SCr of 2.79 mg/dL (H)).  Assessment: 81 year old W  found to have LLE DVT 04/06/22. PMH significant for anemia, breast cancer and obesity. No anticoagulation prior to admission. Pharmacy consulted for heparin.    Heparin level 0.37- therapeutic. H&H stable  Goal of Therapy:  Heparin level 0.3-0.7 units/ml Monitor platelets by anticoagulation protocol: Yes   Plan:  Continue Heparin gtt at 1000 units/hr  Monitor daily heparin level, CBC Monitor for signs/symptoms of bleeding   F/u longterm anticoagulation plan   Vaughan Basta BS, PharmD, BCPS Clinical Pharmacist 04/08/2022 7:51 PM  Contact: [phone] after 3 PM  "Be curious, not judgmental..." -Jamal Maes

## 2022-04-08 NOTE — Progress Notes (Signed)
Patient called me in concerned about BP being high, stating that she normally takes Norvasc and hydralazine. Norvasc was resumed today and BP was 170/89.  Notified Dr. Josephine Cables to make aware. New order given to administer labetalol '5mg'$  IV prn q 6 hrs for SBP >170. Re-assessed with reading of 174/57, administered as ordered. Will continue to monitor.

## 2022-04-08 NOTE — Progress Notes (Signed)
ANTICOAGULATION CONSULT NOTE -  Pharmacy Consult for Heparin Indication:  LLE DVT 04/06/22  Allergies  Allergen Reactions   Ace Inhibitors Swelling   Sulfa Antibiotics Swelling and Rash    Patient Measurements: Height: '5\' 5"'$  (165.1 cm) Weight: 107.6 kg (237 lb 3.4 oz) IBW/kg (Calculated) : 57 Heparin Dosing Weight: 82.2 kg  Labs: Recent Labs    04/06/22 0444 04/06/22 2310 04/07/22 0514 04/07/22 0742 04/07/22 2031 04/08/22 0452  HGB 7.6*  --  8.1*  --   --  8.1*  HCT 24.2*  --  26.2*  --   --  25.7*  PLT 284  --  272  --   --  272  HEPARINUNFRC  --    < >  --  0.94* 0.72* 0.99*  CREATININE 2.99*  --  2.75*  --   --  2.79*   < > = values in this interval not displayed.     Estimated Creatinine Clearance: 20.3 mL/min (A) (by C-G formula based on SCr of 2.79 mg/dL (H)).  Assessment: 81 year old W  found to have LLE DVT 04/06/22. PMH significant for anemia, breast cancer and obesity. No anticoagulation prior to admission. Pharmacy consulted for heparin.    Heparin level 0.99- supratherapeutic. H&H stable  Goal of Therapy:  Heparin level 0.3-0.7 units/ml Monitor platelets by anticoagulation protocol: Yes   Plan:  Hold infusion for 1 hour Decrease Heparin gtt to 1000 units/hr  Monitor daily heparin level, CBC Monitor for signs/symptoms of bleeding   F/u longterm anticoagulation plan   Margot Ables, PharmD Clinical Pharmacist 04/08/2022 7:49 AM

## 2022-04-08 NOTE — Progress Notes (Signed)
Patient ID: [name] [name], female   DOB: 1941-07-05, 81 y.o.   MRN: [id number]    Pt is scheduled for random renal biopsy 6/15 at [facility]  Please see all orders Npo MN tonight Turn off Hep 500 am 6/15 Pt to be at [facility] via ambulance by 900 am 6/15 Pt will return to [facility] after procedure  We will consent pt upon arrival to [facility]  RN aware of plan

## 2022-04-08 NOTE — Progress Notes (Addendum)
[facility] NEPHROLOGY PROGRESS NOTE  Assessment/ Plan: Pt is a 81 y.o. yo female with history of hypertension, breast cancer status postlumpectomy, radiation and currently on anastrozole, anemia presented with shortness of breath, seen in consultation for acute kidney injury.  #Acute kidney injury, nonoliguric: The creatinine level was first noted to be elevated to 1.9 in 01/2022, now presented with creatinine level of 3.16 on admission.  Now with proteinuria, spot urine PCR 3.3 and microscopic hematuria.  The kidney ultrasound unremarkable except bilateral benign-appearing cyst.  The GN work-up positive for ANA and anti-dsDNA antibody.  Anti-GBM, C3, C4 negative.  ANCA and protein electrophoresis is pending. This is concerning for lupus nephritis ? Hydralazine induced. I will discontinue hydralazine and check anti-histone ab.  I have discussed with the patient and her daughter about doing kidney biopsy for definitive diagnosis.  They understand the risk of kidney biopsy including bleeding, infection, loss of kidney function etc.  She will need inpatient kidney biopsy because of being on heparin which needs to be hold before the procedure. I will consult IR for kidney biopsy Meantime I will start empiric prednisone. Creatinine level is stable, nonoliguric. Continue with strict ins and outs and daily lab.  # Anemia, iron deficiency: Check iron level.  Hemoglobin is stable.  No sign of bleeding.  # HTN/volume: Blood pressure elevated.  I will increase amlodipine dose and discontinue hydralazine.  Holding ACE inhibitor or ARB because of AKI.  #Left leg DVT: Currently on heparin.  She has a history of breast cancer.  The above plan discussed with the patient and her daughter at the bedside.    Subjective: Seen and examined at the bedside.  The patient had a urine output of around 2.3 L in 24 hours.  She denies fever, chills, nausea, vomiting, chest pain, shortness of breath.  No skin  rash or joint pain.  Her daughter was present at the bedside. Objective Vital signs in last 24 hours: Vitals:   04/07/22 0502 04/07/22 1512 04/07/22 2016 04/08/22 0409  BP: (!) 162/60 (!) 144/56 (!) 187/61 (!) 159/56  Pulse: 69 66 77 76  Resp: '15 20 20 18  '$ Temp: 98.3 F (36.8 C) 98.9 F (37.2 C) 98.2 F (36.8 C) 98.8 F (37.1 C)  TempSrc: Oral  Oral Oral  SpO2: 95% 95% 96% 93%  Weight:      Height:       Weight change:   Intake/Output Summary (Last 24 hours) at 04/08/2022 1000 Last data filed at 04/08/2022 0500 Gross per 24 hour  Intake 614.06 ml  Output 1100 ml  Net -485.94 ml       Labs: RENAL PANEL Recent Labs    04/05/22 1317 04/06/22 0444 04/07/22 0514 04/08/22 0452  NA 139 139 139 136  K 4.0 4.1 3.8 3.8  CL 106 111 110 107  CO2 '26 27 24 24  '$ GLUCOSE 103* 102* 100* 106*  BUN 40* 34* 32* 31*  CREATININE 3.16* 2.99* 2.75* 2.79*  CALCIUM 9.9 9.2 9.1 9.2  PHOS  --   --  3.2 3.0  ALBUMIN  --   --  2.7* 2.6*     Liver Function Tests: Recent Labs  Lab 04/07/22 0514 04/08/22 0452  ALBUMIN 2.7* 2.6*   No results for input(s): "LIPASE", "AMYLASE" in the last 168 hours. No results for input(s): "AMMONIA" in the last 168 hours. CBC: Recent Labs    04/05/22 1317 04/06/22 0444 04/07/22 0514 04/08/22 0452  HGB 9.3* 7.6* 8.1* 8.1*  MCV 91.2 92.4 91.6 90.8    Cardiac Enzymes: No results for input(s): "CKTOTAL", "CKMB", "CKMBINDEX", "TROPONINI" in the last 168 hours. CBG: No results for input(s): "GLUCAP" in the last 168 hours.  Iron Studies: No results for input(s): "IRON", "TIBC", "TRANSFERRIN", "FERRITIN" in the last 72 hours. Studies/Results: ECHOCARDIOGRAM COMPLETE  Result Date: 04/06/2022    ECHOCARDIOGRAM REPORT   Patient Name:   JANENE YOUSUF Goebel Date of Exam: 04/06/2022 Medical Rec #:  [id number]         Height:       65.0 in Accession #:    [id number]        Weight:       237.2 lb Date of Birth:  02/25/1941         BSA:          2.127 m Patient  Age:    14 years          BP:           182/72 mmHg Patient Gender: F                 HR:           76 bpm. Exam Location:  [facility] Procedure: 2D Echo, Cardiac Doppler and Color Doppler Indications:    Dyspnea  History:        Patient has no prior history of Echocardiogram examinations.                 Signs/Symptoms:Dyspnea; Risk Factors:Hypertension.  Sonographer:    Wenda Low Referring Phys: Harvey  Sonographer Comments: Patient is morbidly obese. IMPRESSIONS  1. Left ventricular ejection fraction, by estimation, is 70 to 75%. Left ventricular ejection fraction by 2D MOD biplane is 71.7 %. The left ventricle has hyperdynamic function. The left ventricle has no regional wall motion abnormalities. There is mild  left ventricular hypertrophy. Left ventricular diastolic parameters are consistent with Grade I diastolic dysfunction (impaired relaxation).  2. Right ventricular systolic function is normal. The right ventricular size is normal. There is normal pulmonary artery systolic pressure. The estimated right ventricular systolic pressure is 24.2 mmHg.  3. Left atrial size was moderately dilated.  4. The mitral valve is abnormal. Trivial mitral valve regurgitation.  5. The aortic valve is tricuspid. Aortic valve regurgitation is not visualized. Mild aortic valve stenosis. Aortic valve area, by VTI measures 1.72 cm. Aortic valve mean gradient measures 12.0 mmHg. Aortic valve Vmax measures 2.47 m/s.  6. The inferior vena cava is normal in size with greater than 50% respiratory variability, suggesting right atrial pressure of 3 mmHg. Comparison(s): No prior Echocardiogram. FINDINGS  Left Ventricle: Left ventricular ejection fraction, by estimation, is 70 to 75%. Left ventricular ejection fraction by 2D MOD biplane is 71.7 %. The left ventricle has hyperdynamic function. The left ventricle has no regional wall motion abnormalities. The left ventricular internal cavity size was normal in size.  There is mild left ventricular hypertrophy. Left ventricular diastolic parameters are consistent with Grade I diastolic dysfunction (impaired relaxation). Indeterminate filling pressures. Right Ventricle: The right ventricular size is normal. No increase in right ventricular wall thickness. Right ventricular systolic function is normal. There is normal pulmonary artery systolic pressure. The tricuspid regurgitant velocity is 2.34 m/s, and  with an assumed right atrial pressure of 3 mmHg, the estimated right ventricular systolic pressure is 68.3 mmHg. Left Atrium: Left atrial size was moderately dilated. Right Atrium: Right atrial size was normal in size. Pericardium: There is  no evidence of pericardial effusion. Mitral Valve: The mitral valve is abnormal. There is mild calcification of the anterior and posterior mitral valve leaflet(s). Trivial mitral valve regurgitation. MV peak gradient, 10.9 mmHg. The mean mitral valve gradient is 3.0 mmHg. Tricuspid Valve: The tricuspid valve is grossly normal. Tricuspid valve regurgitation is trivial. Aortic Valve: The aortic valve is tricuspid. Aortic valve regurgitation is not visualized. Mild aortic stenosis is present. Aortic valve mean gradient measures 12.0 mmHg. Aortic valve peak gradient measures 24.4 mmHg. Aortic valve area, by VTI measures 1.72 cm. Pulmonic Valve: The pulmonic valve was normal in structure. Pulmonic valve regurgitation is not visualized. Aorta: The aortic root and ascending aorta are structurally normal, with no evidence of dilitation. Venous: The inferior vena cava is normal in size with greater than 50% respiratory variability, suggesting right atrial pressure of 3 mmHg. IAS/Shunts: No atrial level shunt detected by color flow Doppler.  LEFT VENTRICLE PLAX 2D                        Biplane EF (MOD) LVIDd:         4.80 cm         LV Biplane EF:   Left LVIDs:         2.80 cm                          ventricular LV PW:         1.30 cm                           ejection LV IVS:        1.30 cm                          fraction by LVOT diam:     2.00 cm                          2D MOD LV SV:         89                               biplane is LV SV Index:   42                               71.7 %. LVOT Area:     3.14 cm                                Diastology                                LV e' medial:    6.85 cm/s LV Volumes (MOD)               LV E/e' medial:  11.6 LV vol d, MOD    74.7 ml       LV e' lateral:   11.20 cm/s A2C:                           LV E/e' lateral: 7.1 LV vol d, MOD  70.5 ml A4C: LV vol s, MOD    20.1 ml A2C: LV vol s, MOD    21.5 ml A4C: LV SV MOD A2C:   54.6 ml LV SV MOD A4C:   70.5 ml LV SV MOD BP:    52.6 ml RIGHT VENTRICLE RV Basal diam:  3.35 cm RV Mid diam:    3.00 cm RV S prime:     14.90 cm/s TAPSE (M-mode): 3.3 cm LEFT ATRIUM             Index        RIGHT ATRIUM           Index LA diam:        4.10 cm 1.93 cm/m   RA Area:     18.90 cm LA Vol (A2C):   62.0 ml 29.15 ml/m  RA Volume:   53.10 ml  24.97 ml/m LA Vol (A4C):   81.6 ml 38.37 ml/m LA Biplane Vol: 76.8 ml 36.11 ml/m  AORTIC VALVE                     PULMONIC VALVE AV Area (Vmax):    1.97 cm      PV Vmax:       1.15 m/s AV Area (Vmean):   1.78 cm      PV Peak grad:  5.3 mmHg AV Area (VTI):     1.72 cm AV Vmax:           247.00 cm/s AV Vmean:          159.500 cm/s AV VTI:            0.516 m AV Peak Grad:      24.4 mmHg AV Mean Grad:      12.0 mmHg LVOT Vmax:         155.00 cm/s LVOT Vmean:        90.400 cm/s LVOT VTI:          0.283 m LVOT/AV VTI ratio: 0.55  AORTA Ao Root diam: 2.80 cm MITRAL VALVE                TRICUSPID VALVE MV Area (PHT): 3.21 cm     TR Peak grad:   21.9 mmHg MV Area VTI:   1.99 cm     TR Vmax:        234.00 cm/s MV Peak grad:  10.9 mmHg MV Mean grad:  3.0 mmHg     SHUNTS MV Vmax:       1.65 m/s     Systemic VTI:  0.28 m MV Vmean:      83.0 cm/s    Systemic Diam: 2.00 cm MV Decel Time: 236 msec MV E velocity: 79.50 cm/s MV A velocity:  141.00 cm/s MV E/A ratio:  0.56 Lyman Bishop MD Electronically signed by Lyman Bishop MD Signature Date/Time: 04/06/2022/6:10:02 PM    Final    US Venous Img Lower Bilateral (DVT)  Result Date: 04/06/2022 CLINICAL DATA:  81 year old female with dyspnea for 2 weeks and swelling of the lower extremity EXAM: BILATERAL LOWER EXTREMITY VENOUS DOPPLER ULTRASOUND TECHNIQUE: Gray-scale sonography with graded compression, as well as color Doppler and duplex ultrasound were performed to evaluate the lower extremity deep venous systems from the level of the common femoral vein and including the common femoral, femoral, profunda femoral, popliteal and calf veins including the posterior tibial, peroneal and gastrocnemius veins when visible. The superficial great saphenous vein was also interrogated. Spectral  Doppler was utilized to evaluate flow at rest and with distal augmentation maneuvers in the common femoral, femoral and popliteal veins. COMPARISON:  None Available. FINDINGS: RIGHT LOWER EXTREMITY Common Femoral Vein: No evidence of thrombus. Normal compressibility, respiratory phasicity and response to augmentation. Saphenofemoral Junction: No evidence of thrombus. Normal compressibility and flow on color Doppler imaging. Profunda Femoral Vein: No evidence of thrombus. Normal compressibility and flow on color Doppler imaging. Femoral Vein: No evidence of thrombus. Normal compressibility, respiratory phasicity and response to augmentation. Popliteal Vein: No evidence of thrombus. Normal compressibility, respiratory phasicity and response to augmentation. Calf Veins: No evidence of thrombus. Normal compressibility and flow on color Doppler imaging. Superficial Great Saphenous Vein: No evidence of thrombus. Normal compressibility and flow on color Doppler imaging Other Findings:  None. LEFT LOWER EXTREMITY Common Femoral Vein: No evidence of thrombus. Normal compressibility, respiratory phasicity and response to  augmentation. Saphenofemoral Junction: No evidence of thrombus. Normal compressibility and flow on color Doppler imaging. Profunda Femoral Vein: No evidence of thrombus. Normal compressibility and flow on color Doppler imaging. Femoral Vein: No evidence of thrombus. Normal compressibility, respiratory phasicity and response to augmentation. Popliteal Vein: No evidence of thrombus. Normal compressibility, respiratory phasicity and response to augmentation. Calf Veins: Occlusive thrombus of the posterior tibial vein, noncompressible. Peroneal vein patent and compressible with flow maintained. Anterior tibial vein patent. Superficial Great Saphenous Vein: No evidence of thrombus. Normal compressibility and flow on color Doppler imaging. Other Findings:  None. IMPRESSION: Directed duplex left lower extremity positive for distal DVT of the posterior tibial vein. Negative for proximal left DVT. Directed duplex right lower extremity negative for DVT. These results will be called to the ordering clinician or representative by the Radiologist Assistant, and communication documented in the PACS or [employer]. Signed, Dulcy Fanny. Nadene Rubins, RPVI [facility] Radiology Electronically Signed   By: Corrie Mckusick D.O.   On: 04/06/2022 12:00   US Renal  Result Date: 04/06/2022 CLINICAL DATA:  Acute renal injury EXAM: RENAL / URINARY TRACT ULTRASOUND COMPLETE COMPARISON:  None Available. FINDINGS: Right Kidney: Renal measurements: 10.7 x 5.1 x 4.9 cm = volume: 167 mL. Two small (1.4 cm) anechoic cysts. No hydronephrosis Left Kidney: Renal measurements: 11.4 x 7.1 x 5.6 cm = volume: 237 mL. Single small 2.3 cm anechoic cyst. No hydronephrosis. Bladder: Appears normal for degree of bladder distention. Other: None. IMPRESSION: 1. No hydronephrosis. 2. Bilateral benign-appearing renal cysts. 3. Normal bladder. Electronically Signed   By: Suzy Bouchard M.D.   On: 04/06/2022  10:39    Medications: Infusions:  heparin 1,000 Units/hr (04/08/22 0853)    Scheduled Medications:  amLODipine  5 mg Oral Daily   anastrozole  1 mg Oral Daily   brimonidine  1 drop Both Eyes BID   And   timolol  1 drop Both Eyes BID   hydrALAZINE  100 mg Oral TID   pantoprazole  40 mg Oral Daily    have reviewed scheduled and prn medications.  Physical Exam: General:NAD, comfortable Heart:RRR, s1s2 nl Lungs:clear b/l, no crackle Abdomen:soft, Non-tender, non-distended Extremities: Trace peripheral edema present. Neurology: Alert, awake and following commands.  Iram Lundberg Reesa Chew James Lafalce 04/08/2022,10:00 AM  LOS: 2 days

## 2022-04-09 ENCOUNTER — Inpatient Hospital Stay (HOSPITAL_COMMUNITY): Payer: Medicare Other

## 2022-04-09 ENCOUNTER — Inpatient Hospital Stay (HOSPITAL_COMMUNITY)
Admit: 2022-04-09 | Discharge: 2022-04-09 | Disposition: A | Discharge status: Home / Self Care | Payer: Medicare Other | Attending: Nephrology | Admitting: Nephrology

## 2022-04-09 DIAGNOSIS — I82402 Acute embolism and thrombosis of unspecified deep veins of left lower extremity: Secondary | ICD-10-CM | POA: Diagnosis not present

## 2022-04-09 DIAGNOSIS — N179 Acute kidney failure, unspecified: Secondary | ICD-10-CM | POA: Diagnosis not present

## 2022-04-09 LAB — TRANSFUSION REACTION
DAT C3: NEGATIVE
Post RXN DAT IgG: NEGATIVE

## 2022-04-09 LAB — RENAL FUNCTION PANEL
Albumin: 2.7 g/dL — ABNORMAL LOW (ref 3.5–5.0)
Anion gap: 4 — ABNORMAL LOW (ref 5–15)
BUN: 41 mg/dL — ABNORMAL HIGH (ref 8–23)
CO2: 24 mmol/L (ref 22–32)
Calcium: 9.5 mg/dL (ref 8.9–10.3)
Chloride: 106 mmol/L (ref 98–111)
Creatinine, Ser: 3.14 mg/dL — ABNORMAL HIGH (ref 0.44–1.00)
GFR, Estimated: 15 mL/min — ABNORMAL LOW (ref >60)
Glucose, Bld: 110 mg/dL — ABNORMAL HIGH (ref 70–99)
Phosphorus: 3.5 mg/dL (ref 2.5–4.6)
Potassium: 4.1 mmol/L (ref 3.5–5.1)
Sodium: 134 mmol/L — ABNORMAL LOW (ref 135–145)

## 2022-04-09 LAB — CBC
HCT: 25.9 % — ABNORMAL LOW (ref 36.0–46.0)
Hemoglobin: 8.2 g/dL — ABNORMAL LOW (ref 12.0–15.0)
MCH: 28.6 pg (ref 26.0–34.0)
MCHC: 31.7 g/dL (ref 30.0–36.0)
MCV: 90.2 fL (ref 80.0–100.0)
Platelets: 293 10*3/uL (ref 150–400)
RBC: 2.87 MIL/uL — ABNORMAL LOW (ref 3.87–5.11)
RDW: 14.5 % (ref 11.5–15.5)
WBC: 7 10*3/uL (ref 4.0–10.5)
nRBC: 0 % (ref 0.0–0.2)

## 2022-04-09 LAB — FERRITIN: Ferritin: 79 ng/mL (ref 11–307)

## 2022-04-09 LAB — HEPARIN LEVEL (UNFRACTIONATED): Heparin Unfractionated: 0.62 IU/mL (ref 0.30–0.70)

## 2022-04-09 LAB — IRON AND TIBC
Iron: 79 ug/dL (ref 28–170)
Saturation Ratios: 33 % — ABNORMAL HIGH (ref 10.4–31.8)
TIBC: 239 ug/dL — ABNORMAL LOW (ref 250–450)
UIBC: 160 ug/dL

## 2022-04-09 LAB — PROTIME-INR
INR: 1 (ref 0.8–1.2)
Prothrombin Time: 12.9 seconds (ref 11.4–15.2)

## 2022-04-09 LAB — HISTONE ANTIBODIES, IGG, BLOOD: DNA-Histone: 4.9 Units — ABNORMAL HIGH (ref 0.0–0.9)

## 2022-04-09 MED ORDER — HYDRALAZINE HCL 20 MG/ML IJ SOLN
INTRAMUSCULAR | Status: AC
  Filled 2022-04-09: qty 1

## 2022-04-09 MED ORDER — FENTANYL CITRATE (PF) 100 MCG/2ML IJ SOLN
INTRAMUSCULAR | Status: AC | PRN
  Administered 2022-04-09 (×3): 25 ug via INTRAVENOUS

## 2022-04-09 MED ORDER — HEPARIN (PORCINE) 25000 UT/250ML-% IV SOLN
1100.0000 [IU]/h | INTRAVENOUS | Status: DC
Start: 2022-04-10 — End: 2022-04-13
  Administered 2022-04-10: 1000 [IU]/h via INTRAVENOUS
  Administered 2022-04-11 – 2022-04-13 (×3): 1100 [IU]/h via INTRAVENOUS
  Filled 2022-04-09 (×4): qty 250

## 2022-04-09 MED ORDER — HYDRALAZINE HCL 20 MG/ML IJ SOLN
INTRAMUSCULAR | Status: AC | PRN
  Administered 2022-04-09 (×2): 10 mg via INTRAVENOUS

## 2022-04-09 MED ORDER — FENTANYL CITRATE (PF) 100 MCG/2ML IJ SOLN
INTRAMUSCULAR | Status: AC
  Filled 2022-04-09: qty 2

## 2022-04-09 MED ORDER — MIDAZOLAM HCL 2 MG/2ML IJ SOLN
INTRAMUSCULAR | Status: AC
  Filled 2022-04-09: qty 2

## 2022-04-09 MED ORDER — MIDAZOLAM HCL 2 MG/2ML IJ SOLN
INTRAMUSCULAR | Status: AC | PRN
  Administered 2022-04-09: 1 mg via INTRAVENOUS
  Administered 2022-04-09: .5 mg via INTRAVENOUS

## 2022-04-09 MED ORDER — HYDROCODONE-ACETAMINOPHEN 5-325 MG PO TABS: 1.0000 | ORAL_TABLET | ORAL | Status: DC | PRN

## 2022-04-09 MED ORDER — LIDOCAINE HCL (PF) 1 % IJ SOLN
INTRAMUSCULAR | Status: AC
  Filled 2022-04-09: qty 30

## 2022-04-09 NOTE — Progress Notes (Signed)
ANTICOAGULATION CONSULT NOTE -  Pharmacy Consult for Heparin Indication:  LLE DVT 04/06/22  Allergies  Allergen Reactions   Ace Inhibitors Swelling   Sulfa Antibiotics Swelling and Rash    Patient Measurements: Height: '5\' 5"'$  (165.1 cm) Weight: 107.6 kg (237 lb 3.4 oz) IBW/kg (Calculated) : 57 Heparin Dosing Weight: 82.2 kg  Labs: Recent Labs    04/07/22 0514 04/07/22 0742 04/08/22 0452 04/08/22 1920 04/09/22 0531 04/09/22 0920  HGB 8.1*  --  8.1*  --  8.2*  --   HCT 26.2*  --  25.7*  --  25.9*  --   PLT 272  --  272  --  293  --   LABPROT  --   --   --   --   --  12.9  INR  --   --   --   --   --  1.0  HEPARINUNFRC  --    < > 0.99* 0.37 0.62  --   CREATININE 2.75*  --  2.79*  --  3.14*  --    < > = values in this interval not displayed.     Estimated Creatinine Clearance: 18 mL/min (A) (by C-G formula based on SCr of 3.14 mg/dL (H)).  Assessment: 81 year old W  found to have LLE DVT 04/06/22. PMH significant for anemia, breast cancer and obesity. No anticoagulation prior to admission. Pharmacy consulted for heparin.    Heparin level 0.62- therapeutic. H&H stable. Now being stopped for IR/renal biopsy. Restart 12 hours post biopsy.  Goal of Therapy:  Heparin level 0.3-0.7 units/ml Monitor platelets by anticoagulation protocol: Yes   Plan:  Restart Heparin infusion at 1000 units/hr 12 hours post renal biopsy ~ 0030 Monitor daily heparin level, CBC Monitor for signs/symptoms of bleeding   F/u longterm anticoagulation plan   Margot Ables, PharmD Clinical Pharmacist 04/09/2022 5:18 PM

## 2022-04-09 NOTE — Progress Notes (Signed)
Pt arrived back to unit 300 room 333 via stretcher with [facility]. Pt was able to transfer from stretcher to bed with assistance. No complaints of pain at this time. Vital signs were obtained, bed in lowest position with call light in pt reach. Will continue to monitor pt.

## 2022-04-09 NOTE — Progress Notes (Signed)
   04/09/22 1312  Assess: MEWS Score  Temp 97.8 F (36.6 C)  BP (!) 158/57  MAP (mmHg) 86  Pulse Rate 75  ECG Heart Rate (!) 21  Level of Consciousness Alert  SpO2 96 %  O2 Device Room Air  Assess: MEWS Score  MEWS Temp 0  MEWS Systolic 0  MEWS Pulse 2  MEWS RR 0  MEWS LOC 0  MEWS Score 2  MEWS Score Color Yellow  Treat  Pain Scale 0-10  Pain Score 0  Assess: SIRS CRITERIA  SIRS Temperature  0  SIRS Pulse 0  SIRS Respirations  0  SIRS WBC 0  SIRS Score Sum  0

## 2022-04-09 NOTE — Sedation Documentation (Signed)
Report given to [facility]. 

## 2022-04-09 NOTE — Consult Note (Signed)
Chief Complaint: Patient was seen in consultation today for  Chief Complaint  Patient presents with   Shortness of Breath    Referring Physician(s): Dr. Carolin Sicks  Supervising Physician: Juliet Rude  Patient Status: [name] [name] Inpatient   History of Present Illness: [name] [name] is a 81 y.o. female with a medical history significant for anemia, HTN and left breast cancer (dx 2011, Stage 1 s/p lumpectomy and radiation). She presented to the [facility] ED 04/05/22 for evaluation of difficulty breathing over the past two weeks. She also complained of lower extremity swelling and abdominal bloating. Labs were notable for increased creatinine level, hematuria and proteinuria. She was also found to have a left lower extremity DVT and she was started on a heparin infusion. Further work up revealed positive ANA and anti-dsDNA antibody and there is concern for lupus nephritis.   Interventional Radiology has been asked to evaluate this patient for an image-guided non-focal renal biopsy.  Past Medical History:  Diagnosis Date   Anemia    Anxiety    Arthritis    back, shoulders ,  right hip   Breast cancer, left [facility]) oncologist-- dr Audelia Hives ([place] in [place])--- per lov in epic no recurrence   dx 05/ 2011---- Stage I (T1,N0M0), DCIS, ER+;  s/p  left breast lumpectomy ,  completed radiation therapy 06-02-2010,  started antiestrogen   Full dentures    GERD (gastroesophageal reflux disease)    History of colon polyps 2009   Dr. Collene Mares   History of external beam radiation therapy 04-21-2010  to 06-02-2010   left breast cancer   Hyperparathyroidism ([place])    per pt dx 03/ 2019,  was told by surgeon not high enough to worry about   Hypertension    Vitamin D deficiency    Wears glasses     Past Surgical History:  Procedure Laterality Date   APPLICATION OF WOUND VAC Right 10/06/2018   Procedure: APPLICATION OF WOUND VAC;  Surgeon: Rod Can, MD;  Location: [facility];   Service: Orthopedics;  Laterality: Right;   BREAST LUMPECTOMY Left 2011   BREAST LUMPECTOMY Left 05/02/2020   COLONOSCOPY N/A 05/18/2013   Procedure: COLONOSCOPY;  Surgeon: Daneil Dolin, MD;  Location: [facility];  Service: Endoscopy;  Laterality: N/A;  9:30   DILATATION & CURETTAGE/HYSTEROSCOPY WITH MYOSURE N/A 05/28/2016   Procedure: DILATATION & CURETTAGE/HYSTEROSCOPY WITH MYOSURE;  Surgeon: Servando Salina, MD;  Location: [facility];  Service: Gynecology;  Laterality: N/A;   INCISION AND DRAINAGE HIP Right 10/06/2018   Procedure: DEBRIDEMENT AND CLOSURE RIGHT HIP;  Surgeon: Rod Can, MD;  Location: [facility];  Service: Orthopedics;  Laterality: Right;   TOTAL HIP ARTHROPLASTY Right 09/15/2018   Procedure: TOTAL HIP ARTHROPLASTY ANTERIOR APPROACH;  Surgeon: Rod Can, MD;  Location: [facility];  Service: Orthopedics;  Laterality: Right;   TOTAL KNEE ARTHROPLASTY Bilateral left 05-07-2009;  right 02-07-2010   both by dr Theda Sers '@[email or web address]'$     Allergies: Ace inhibitors and Sulfa antibiotics  Medications: Prior to Admission medications   Medication Sig Start Date End Date Taking? Authorizing Provider  amLODipine (NORVASC) 10 MG tablet Take 10 mg by mouth daily. 02/26/22  Yes [provider]  anastrozole (ARIMIDEX) 1 MG tablet Take 1 mg by mouth daily. 02/24/22  Yes [provider]  cetirizine (ZYRTEC) 10 MG tablet Take 10 mg by mouth every morning.    Yes [provider]  Cholecalciferol 25 MCG (1000 UT) tablet Take by mouth.  Yes [provider]  COMBIGAN 0.2-0.5 % ophthalmic solution Place 1 drop into both eyes every 12 (twelve) hours. 05/22/20  Yes [provider]  famotidine (PEPCID) 10 MG tablet Take 10 mg by mouth 2 (two) times daily.   Yes [provider]  hydrALAZINE (APRESOLINE) 100 MG tablet Take 100 mg by mouth 3 (three) times daily. 02/25/22  Yes [provider]  ROCKLATAN 0.02-0.005 % SOLN Apply 1 drop to eye at  bedtime. 02/23/22  Yes [provider]     Family History  Problem Relation Age of Onset   Breast cancer Mother    Colon cancer Neg Hx    Lung cancer Neg Hx    Ovarian cancer Neg Hx     Social History   Socioeconomic History   Marital status: Legally Separated    Spouse name: Not on file   Number of children: 4   Years of education: Not on file   Highest education level: Not on file  Occupational History   Not on file  Tobacco Use   Smoking status: Former    Years: 15.00    Types: Cigarettes    Quit date: 09/09/1974    Years since quitting: 47.6   Smokeless tobacco: Never  Vaping Use   Vaping Use: Never used  Substance and Sexual Activity   Alcohol use: No   Drug use: Never   Sexual activity: Not on file  Other Topics Concern   Not on file  Social History Narrative   Not on file   Social Determinants of Health   Financial Resource Strain: Not on file  Food Insecurity: Not on file  Transportation Needs: Not on file  Physical Activity: Not on file  Stress: Not on file  Social Connections: Not on file    Review of Systems: A 12 point ROS discussed and pertinent positives are indicated in the HPI above.  All other systems are negative.  Review of Systems  Constitutional:  Negative for appetite change and fatigue.  Respiratory:  Positive for cough and shortness of breath.   Cardiovascular:  Positive for leg swelling.  Gastrointestinal:  Negative for abdominal pain, diarrhea, nausea and vomiting.  Genitourinary:  Negative for difficulty urinating.  Neurological:  Negative for dizziness and headaches.    Vital Signs: BP (!) 160/58 (BP Location: Right Arm)   Pulse 74   Temp 98.6 F (37 C) (Oral)   Resp 20   Ht '5\' 5"'$  (1.651 m)   Wt 237 lb 3.4 oz (107.6 kg)   SpO2 95%   BMI 39.47 kg/m   Physical Exam Constitutional:      General: She is not in acute distress.    Appearance: She is not ill-appearing.  HENT:     Mouth/Throat:     Mouth: Mucous  membranes are moist.     Pharynx: Oropharynx is clear.  Cardiovascular:     Rate and Rhythm: Normal rate and regular rhythm.     Pulses: Normal pulses.     Heart sounds: Normal heart sounds.  Pulmonary:     Effort: Pulmonary effort is normal.     Breath sounds: Normal breath sounds.     Comments: Dyspnea with exertion  Abdominal:     General: Bowel sounds are normal.     Palpations: Abdomen is soft.     Tenderness: There is no abdominal tenderness.  Musculoskeletal:     Right lower leg: Edema present.     Left lower leg: Edema present.  Skin:    General: Skin is warm and dry.  Neurological:     Mental Status: She is alert and oriented to person, place, and time.     Imaging: ECHOCARDIOGRAM COMPLETE  Result Date: 04/06/2022    ECHOCARDIOGRAM REPORT   Patient Name:   [name] [name] Date of Exam: 04/06/2022 Medical Rec #:  [id number]         Height:       65.0 in Accession #:    [id number]        Weight:       237.2 lb Date of Birth:  1940/11/30         BSA:          2.127 m Patient Age:    78 years          BP:           182/72 mmHg Patient Gender: F                 HR:           76 bpm. Exam Location:  [name] [name] Procedure: 2D Echo, Cardiac Doppler and Color Doppler Indications:    Dyspnea  History:        Patient has no prior history of Echocardiogram examinations.                 Signs/Symptoms:Dyspnea; Risk Factors:Hypertension.  Sonographer:    Wenda Low Referring Phys: [place]  Sonographer Comments: Patient is morbidly obese. IMPRESSIONS  1. Left ventricular ejection fraction, by estimation, is 70 to 75%. Left ventricular ejection fraction by 2D MOD biplane is 71.7 %. The left ventricle has hyperdynamic function. The left ventricle has no regional wall motion abnormalities. There is mild  left ventricular hypertrophy. Left ventricular diastolic parameters are consistent with Grade I diastolic dysfunction (impaired relaxation).  2. Right ventricular systolic function  is normal. The right ventricular size is normal. There is normal pulmonary artery systolic pressure. The estimated right ventricular systolic pressure is 17.0 mmHg.  3. Left atrial size was moderately dilated.  4. The mitral valve is abnormal. Trivial mitral valve regurgitation.  5. The aortic valve is tricuspid. Aortic valve regurgitation is not visualized. Mild aortic valve stenosis. Aortic valve area, by VTI measures 1.72 cm. Aortic valve mean gradient measures 12.0 mmHg. Aortic valve Vmax measures 2.47 m/s.  6. The inferior vena cava is normal in size with greater than 50% respiratory variability, suggesting right atrial pressure of 3 mmHg. Comparison(s): No prior Echocardiogram. FINDINGS  Left Ventricle: Left ventricular ejection fraction, by estimation, is 70 to 75%. Left ventricular ejection fraction by 2D MOD biplane is 71.7 %. The left ventricle has hyperdynamic function. The left ventricle has no regional wall motion abnormalities. The left ventricular internal cavity size was normal in size. There is mild left ventricular hypertrophy. Left ventricular diastolic parameters are consistent with Grade I diastolic dysfunction (impaired relaxation). Indeterminate filling pressures. Right Ventricle: The right ventricular size is normal. No increase in right ventricular wall thickness. Right ventricular systolic function is normal. There is normal pulmonary artery systolic pressure. The tricuspid regurgitant velocity is 2.34 m/s, and  with an assumed right atrial pressure of 3 mmHg, the estimated right ventricular systolic pressure is 01.7 mmHg. Left Atrium: Left atrial size was moderately dilated. Right Atrium: Right atrial size was normal in size. Pericardium: There is no evidence of pericardial effusion. Mitral Valve: The mitral valve is abnormal. There is mild calcification of the anterior  and posterior mitral valve leaflet(s). Trivial mitral valve regurgitation. MV peak gradient, 10.9 mmHg. The mean mitral  valve gradient is 3.0 mmHg. Tricuspid Valve: The tricuspid valve is grossly normal. Tricuspid valve regurgitation is trivial. Aortic Valve: The aortic valve is tricuspid. Aortic valve regurgitation is not visualized. Mild aortic stenosis is present. Aortic valve mean gradient measures 12.0 mmHg. Aortic valve peak gradient measures 24.4 mmHg. Aortic valve area, by VTI measures 1.72 cm. Pulmonic Valve: The pulmonic valve was normal in structure. Pulmonic valve regurgitation is not visualized. Aorta: The aortic root and ascending aorta are structurally normal, with no evidence of dilitation. Venous: The inferior vena cava is normal in size with greater than 50% respiratory variability, suggesting right atrial pressure of 3 mmHg. IAS/Shunts: No atrial level shunt detected by color flow Doppler.  LEFT VENTRICLE PLAX 2D                        Biplane EF (MOD) LVIDd:         4.80 cm         LV Biplane EF:   Left LVIDs:         2.80 cm                          ventricular LV PW:         1.30 cm                          ejection LV IVS:        1.30 cm                          fraction by LVOT diam:     2.00 cm                          2D MOD LV SV:         89                               biplane is LV SV Index:   42                               71.7 %. LVOT Area:     3.14 cm                                Diastology                                LV e' medial:    6.85 cm/s LV Volumes (MOD)               LV E/e' medial:  11.6 LV vol d, MOD    74.7 ml       LV e' lateral:   11.20 cm/s A2C:                           LV E/e' lateral: 7.1 LV vol d, MOD    70.5 ml A4C: LV vol s, MOD    20.1 ml A2C: LV vol s,  MOD    21.5 ml A4C: LV SV MOD A2C:   54.6 ml LV SV MOD A4C:   70.5 ml LV SV MOD BP:    52.6 ml RIGHT VENTRICLE RV Basal diam:  3.35 cm RV Mid diam:    3.00 cm RV S prime:     14.90 cm/s TAPSE (M-mode): 3.3 cm LEFT ATRIUM             Index        RIGHT ATRIUM           Index LA diam:        4.10 cm 1.93 cm/m   RA Area:      18.90 cm LA Vol (A2C):   62.0 ml 29.15 ml/m  RA Volume:   53.10 ml  24.97 ml/m LA Vol (A4C):   81.6 ml 38.37 ml/m LA Biplane Vol: 76.8 ml 36.11 ml/m  AORTIC VALVE                     PULMONIC VALVE AV Area (Vmax):    1.97 cm      PV Vmax:       1.15 m/s AV Area (Vmean):   1.78 cm      PV Peak grad:  5.3 mmHg AV Area (VTI):     1.72 cm AV Vmax:           247.00 cm/s AV Vmean:          159.500 cm/s AV VTI:            0.516 m AV Peak Grad:      24.4 mmHg AV Mean Grad:      12.0 mmHg LVOT Vmax:         155.00 cm/s LVOT Vmean:        90.400 cm/s LVOT VTI:          0.283 m LVOT/AV VTI ratio: 0.55  AORTA Ao Root diam: 2.80 cm MITRAL VALVE                TRICUSPID VALVE MV Area (PHT): 3.21 cm     TR Peak grad:   21.9 mmHg MV Area VTI:   1.99 cm     TR Vmax:        234.00 cm/s MV Peak grad:  10.9 mmHg MV Mean grad:  3.0 mmHg     SHUNTS MV Vmax:       1.65 m/s     Systemic VTI:  0.28 m MV Vmean:      83.0 cm/s    Systemic Diam: 2.00 cm MV Decel Time: 236 msec MV E velocity: 79.50 cm/s MV A velocity: 141.00 cm/s MV E/A ratio:  0.56 Lyman Bishop MD Electronically signed by Lyman Bishop MD Signature Date/Time: 04/06/2022/6:10:02 PM    Final    US Venous Img Lower Bilateral (DVT)  Result Date: 04/06/2022 CLINICAL DATA:  81 year old female with dyspnea for 2 weeks and swelling of the lower extremity EXAM: BILATERAL LOWER EXTREMITY VENOUS DOPPLER ULTRASOUND TECHNIQUE: Gray-scale sonography with graded compression, as well as color Doppler and duplex ultrasound were performed to evaluate the lower extremity deep venous systems from the level of the common femoral vein and including the common femoral, femoral, profunda femoral, popliteal and calf veins including the posterior tibial, peroneal and gastrocnemius veins when visible. The superficial great saphenous vein was also interrogated. Spectral Doppler was utilized to evaluate flow at rest and with distal augmentation maneuvers in the common  femoral, femoral and  popliteal veins. COMPARISON:  None Available. FINDINGS: RIGHT LOWER EXTREMITY Common Femoral Vein: No evidence of thrombus. Normal compressibility, respiratory phasicity and response to augmentation. Saphenofemoral Junction: No evidence of thrombus. Normal compressibility and flow on color Doppler imaging. Profunda Femoral Vein: No evidence of thrombus. Normal compressibility and flow on color Doppler imaging. Femoral Vein: No evidence of thrombus. Normal compressibility, respiratory phasicity and response to augmentation. Popliteal Vein: No evidence of thrombus. Normal compressibility, respiratory phasicity and response to augmentation. Calf Veins: No evidence of thrombus. Normal compressibility and flow on color Doppler imaging. Superficial Great Saphenous Vein: No evidence of thrombus. Normal compressibility and flow on color Doppler imaging Other Findings:  None. LEFT LOWER EXTREMITY Common Femoral Vein: No evidence of thrombus. Normal compressibility, respiratory phasicity and response to augmentation. Saphenofemoral Junction: No evidence of thrombus. Normal compressibility and flow on color Doppler imaging. Profunda Femoral Vein: No evidence of thrombus. Normal compressibility and flow on color Doppler imaging. Femoral Vein: No evidence of thrombus. Normal compressibility, respiratory phasicity and response to augmentation. Popliteal Vein: No evidence of thrombus. Normal compressibility, respiratory phasicity and response to augmentation. Calf Veins: Occlusive thrombus of the posterior tibial vein, noncompressible. Peroneal vein patent and compressible with flow maintained. Anterior tibial vein patent. Superficial Great Saphenous Vein: No evidence of thrombus. Normal compressibility and flow on color Doppler imaging. Other Findings:  None. IMPRESSION: Directed duplex left lower extremity positive for distal DVT of the posterior tibial vein. Negative for proximal left DVT. Directed duplex right lower extremity  negative for DVT. These results will be called to the ordering clinician or representative by the Radiologist Assistant, and communication documented in the PACS or [employer]. Signed, Dulcy Fanny. Nadene Rubins, RPVI [facility] Radiology Electronically Signed   By: Corrie Mckusick D.O.   On: 04/06/2022 12:00   US Renal  Result Date: 04/06/2022 CLINICAL DATA:  Acute renal injury EXAM: RENAL / URINARY TRACT ULTRASOUND COMPLETE COMPARISON:  None Available. FINDINGS: Right Kidney: Renal measurements: 10.7 x 5.1 x 4.9 cm = volume: 167 mL. Two small (1.4 cm) anechoic cysts. No hydronephrosis Left Kidney: Renal measurements: 11.4 x 7.1 x 5.6 cm = volume: 237 mL. Single small 2.3 cm anechoic cyst. No hydronephrosis. Bladder: Appears normal for degree of bladder distention. Other: None. IMPRESSION: 1. No hydronephrosis. 2. Bilateral benign-appearing renal cysts. 3. Normal bladder. Electronically Signed   By: Suzy Bouchard M.D.   On: 04/06/2022 10:39   DG Chest 2 View  Result Date: 04/05/2022 CLINICAL DATA:  Pt c/o sob and weakness for several days. Denies cp, fever or n/v. HX of hypertension and CA. EXAM: CHEST - 2 VIEW COMPARISON:  01/08/2020. FINDINGS: Mild enlargement of the cardiopericardial silhouette. No mediastinal or hilar masses. No evidence of adenopathy. Clear lungs.  No pleural effusion or pneumothorax. Skeletal structures are intact. IMPRESSION: No acute cardiopulmonary disease. Electronically Signed   By: Lajean Manes M.D.   On: 04/05/2022 14:04    Labs:  CBC: Recent Labs    04/06/22 0444 04/07/22 0514 04/08/22 0452 04/09/22 0531  WBC 5.3 6.8 6.3 7.0  HGB 7.6* 8.1* 8.1* 8.2*  HCT 24.2* 26.2* 25.7* 25.9*  PLT 284 272 272 293    COAGS: No results for input(s): "INR", "APTT" in the last 8760 hours.  BMP: Recent Labs    04/06/22 0444 04/07/22 0514 04/08/22 0452 04/09/22 0531  [name] 139 139 136 134*  K 4.1 3.8 3.8 4.1  CL 111  110  107 106  CO2 '27 24 24 24  '$ GLUCOSE 102* 100* 106* 110*  BUN 34* 32* 31* 41*  CALCIUM 9.2 9.1 9.2 9.5  CREATININE 2.99* 2.75* 2.79* 3.14*  GFRNONAA 15* 17* 17* 15*    LIVER FUNCTION TESTS: Recent Labs    04/07/22 0514 04/08/22 0452 04/09/22 0531  ALBUMIN 2.7* 2.6* 2.7*    TUMOR MARKERS: No results for input(s): "AFPTM", "CEA", "CA199", "CHROMGRNA" in the last 8760 hours.  Assessment and Plan:  Acute Kidney Injury with concern for Lupus Nephritis: [name] [name], 81 year old female, is currently being transported to [facility] via [place] for an image-guided non-focal renal biopsy. She will return to [facility] post-procedure. The procedure was discussed with the patient and her daughter at the bedside prior to departure. Consent has been signed and is in the [employer].   Risks and benefits of this procedure were discussed with the patient and/or patient's family including, but not limited to bleeding, infection, damage to adjacent structures or low yield requiring additional tests.  All of the questions were answered and there is agreement to proceed. She has been NPO. Heparin infusion was turned off at 0500 this morning.   Consent signed and in [facility] packet.    Thank you for this interesting consult.  I greatly enjoyed meeting [name] [name] and look forward to participating in their care.  A copy of this report was sent to the requesting provider on this date.  Electronically Signed: Soyla Dryer, AGACNP-BC [phone] 04/09/2022, 8:28 AM   I spent a total of 20 Minutes    in face to face in clinical consultation, greater than 50% of which was counseling/coordinating care for non-focal renal biopsy.

## 2022-04-09 NOTE — Sedation Documentation (Signed)
Pt in CT.

## 2022-04-09 NOTE — Progress Notes (Signed)
PROGRESS NOTE  [name] [name] JSH:[id number] DOB: 11-06-1940 DOA: 04/05/2022 PCP: Monico Blitz, MD  Brief History:  As per H&P written by Dr. Denton Brick on 04/05/2022 [name] [name] is a 81 y.o. female with medical history significant for recurrent breast cancer, Fe deficiency anemia, hyperparathyroidism, hypertension. Patient presented to the ED with intermittent difficulty breathing over the past 2 weeks.  Symptoms are mostly with exertion.  She denies cough.  No chest pain.  She reports swelling of her lower extremities today.  She feels her abdomen is bloated.  Checks her weight regularly, she reports there has been no change. She denies vomiting, no loose stools.  She has maintained good oral intake.  Not on any diuretics.   ED Course:  Temp 97.6.  Heart rate 70s to 80s.  Respiratory rate 13-24.  Blood pressure systolic 850Y to 774J. Creatinine elevated at 3.16, BNP 73.  Chest x-ray without acute abnormality. 1 L bolus given.  Renal ultrasound ordered which did not show hydronephrosis.  Her renal function continued to worsen.  Nephrology was consulted.  Auto-antibodies were ordered and showed positive ANA, anti-dsDNA, anti-histone, P-ANCA.  She was started on empiric prednisone.  Renal biopsy was peformed ont 6/15.  She was found to have LLE DVT for which she was started on IV heparin.  This was held for her renal biopsy and restarted 12 hours after.    Assessment and Plan: * AKI (acute kidney injury) ([place]) -Cr-3.16, per Care Everywhere last check 4/24 creatinine was 1.9.  -Cr down 2.75 after IVF's.  BUN 32 -appreciate renal  -ANA and anti-dsDNA positive -PANCA positive -renal US without hydronephrosis. 04/09/22--planning renal biopsy -antiGBM, C3, C4 neg -empiric prednisone started 6/14 -serum creatinine continues to gradually rise   Left leg DVT (HCC) -Positive lower extremity Dopplers for left distal leg DVT -Heparin initiated. -At least 6 months of  anticoagulation using Eliquis will be required at discharge -Sedentarism and prior history of breast cancer are risk factors -continue IV heparin>>held for renal biopsy 6/15 -restart AC when cleared by IR  Obesity, Class III, BMI 40-49.9 (morbid obesity) ([place]) -Body mass index is 39.47 kg/m. -Lifestyle modification  Dyspnea -Dyspnea with exertion. -suspect symptomatic anemia -1 unit PRBCs transfused >>dyspnea improved; stable on RA -04/06/22 echo G1DD LV with hyperdynamic function, no wall motion abnormalities, ejection fraction 70 to 75%.  No significant valvular disorder.   -Lower extremity Doppler with positive left leg DVT; therapy with anticoagulation will be initiated.      HTN (hypertension) -Systolic 287O to 676H at time of admission. -Continue amlodipine -hydralazine discontinued due to concern for drug induced lupus  Malignant neoplasm of upper outer quadrant of female breast ([place]) -Status post lumpectomy, radiation treatment, currently on anastrozole. -Continue patient follow-up with oncology service.  Anemia, iron deficiency -In the setting of anemia chronic kidney disease -No overt bleeding appreciated -Given symptomatic symptoms after discussing with nephrology service 1 unit of PRBCs transfused -IV iron and Epogen therapy as per renal discretion. -Current hemoglobin after transfusion 8.1.       Family Communication:  no Family at bedside  Consultants:  renal  Code Status:  FULL   DVT Prophylaxis:  IV  Heparin on hold 6/15 for renal bx  Procedures: As Listed in Progress Note Above  Antibiotics: None        Subjective: Patient denies fevers, chills, headache, chest pain, dyspnea, nausea, vomiting, diarrhea, abdominal pain, dysuria, hematuria, hematochezia, and melena.  Objective: Vitals:   04/08/22 2303 04/09/22 0539 04/09/22 1312 04/09/22 1616  BP: (!) 157/56 (!) 160/58 (!) 158/57 (!) 158/61  Pulse: 73 74 75 66  Resp:  20  20  Temp:   98.6 F (37 C) 97.8 F (36.6 C) 98.2 F (36.8 C)  TempSrc:  Oral Oral Oral  SpO2:  95% 96% 95%  Weight:      Height:        Intake/Output Summary (Last 24 hours) at 04/09/2022 1816 Last data filed at 04/09/2022 1349 Gross per 24 hour  Intake 340 ml  Output 1100 ml  Net -760 ml   Weight change:  Exam:  General:  Pt is alert, follows commands appropriately, not in acute distress HEENT: No icterus, No thrush, No neck mass, [place]/AT Cardiovascular: RRR, S1/S2, no rubs, no gallops Respiratory: bibasilar crackles.  No wheeze Abdomen: Soft/+BS, non tender, non distended, no guarding Extremities: 1 + LE edema, No lymphangitis, No petechiae, No rashes, no synovitis   Data Reviewed: I have personally reviewed following labs and imaging studies Basic Metabolic Panel: Recent Labs  Lab 04/05/22 1317 04/06/22 0444 04/07/22 0514 04/08/22 0452 04/09/22 0531  NA 139 139 139 136 134*  K 4.0 4.1 3.8 3.8 4.1  CL 106 111 110 107 106  CO2 '26 27 24 24 24  '$ GLUCOSE 103* 102* 100* 106* 110*  BUN 40* 34* 32* 31* 41*  CREATININE 3.16* 2.99* 2.75* 2.79* 3.14*  CALCIUM 9.9 9.2 9.1 9.2 9.5  PHOS  --   --  3.2 3.0 3.5   Liver Function Tests: Recent Labs  Lab 04/07/22 0514 04/08/22 0452 04/09/22 0531  ALBUMIN 2.7* 2.6* 2.7*   No results for input(s): "LIPASE", "AMYLASE" in the last 168 hours. No results for input(s): "AMMONIA" in the last 168 hours. Coagulation Profile: Recent Labs  Lab 04/09/22 0920  INR 1.0   CBC: Recent Labs  Lab 04/05/22 1317 04/06/22 0444 04/07/22 0514 04/08/22 0452 04/09/22 0531  WBC 5.6 5.3 6.8 6.3 7.0  HGB 9.3* 7.6* 8.1* 8.1* 8.2*  HCT 28.9* 24.2* 26.2* 25.7* 25.9*  MCV 91.2 92.4 91.6 90.8 90.2  PLT 278 284 272 272 293   Cardiac Enzymes: No results for input(s): "CKTOTAL", "CKMB", "CKMBINDEX", "TROPONINI" in the last 168 hours. BNP: Invalid input(s): "POCBNP" CBG: No results for input(s): "GLUCAP" in the last 168 hours. HbA1C: No results for  input(s): "HGBA1C" in the last 72 hours. Urine analysis:    Component Value Date/Time   COLORURINE STRAW (A) 04/05/2022 1650   APPEARANCEUR CLEAR 04/05/2022 1650   LABSPEC 1.008 04/05/2022 1650   PHURINE 8.0 04/05/2022 1650   GLUCOSEU NEGATIVE 04/05/2022 1650   HGBUR MODERATE (A) 04/05/2022 1650   BILIRUBINUR NEGATIVE 04/05/2022 1650   KETONESUR NEGATIVE 04/05/2022 1650   PROTEINUR 100 (A) 04/05/2022 1650   UROBILINOGEN 0.2 02/03/2010 1200   NITRITE NEGATIVE 04/05/2022 1650   LEUKOCYTESUR NEGATIVE 04/05/2022 1650   Sepsis Labs: '@LABRCNTIP'$ (procalcitonin:4,lacticidven:4) )No results found for this or any previous visit (from the past 240 hour(s)).   Scheduled Meds:  amLODipine  10 mg Oral Daily   anastrozole  1 mg Oral Daily   brimonidine  1 drop Both Eyes BID   And   timolol  1 drop Both Eyes BID   pantoprazole  40 mg Oral Daily   predniSONE  60 mg Oral Daily   Continuous Infusions:  [START ON 04/10/2022] heparin      Procedures/Studies: US BIOPSY (KIDNEY)  Result Date: 04/09/2022 CLINICAL DATA:  Renal  insufficiency, biopsy requested EXAM: ULTRASOUND AND CT-GUIDED CORE RENAL BIOPSY COMPARISON:  None Available. TECHNIQUE: Survey ultrasound was performed and an appropriate skin entry site was localized. Site was marked, prepped with chlorhexidine, draped in usual sterile fashion, infiltrated locally with 1% lidocaine. Intravenous Fentanyl 67mg and Versed 1.'5mg'$  were administered as conscious sedation during continuous monitoring of the patient's level of consciousness and physiological / cardiorespiratory status by the radiology RN, with a total moderate sedation time of 78 minutes. Under real time ultrasound guidance, a 15 gauge trocar needle was advanced to the margin of the lower pole of the left kidney for 2 coaxial 16 gauge core biopsy needle passes. The first was mainly fat. The second was small. Subsequently, there was limited visualization precluding additional safe core  biopsies with ultrasound. For this reason, patient was transferred to CT, placed prone, and limited axial scans obtained to localize the lower pole left kidney again. There was only minimal regional hemorrhage. Subsequently, after the region was prepped and draped and infiltrated with 1% lidocaine, the 15 gauge needle was advanced to the margin of the lower pole left kidney again and additional core samples were obtained which appeared grossly adequate. Postprocedure scan shows no significant hemorrhage or other apparent complication. The patient tolerated procedure well. COMPLICATIONS: None. IMPRESSION: 1. Technically successful ultrasound- and CT-guided core renal biopsy , left lower pole. Electronically Signed   By: DLucrezia EuropeM.D.   On: 04/09/2022 15:20   CT RENAL BIOPSY  Result Date: 04/09/2022 CLINICAL DATA:  Renal insufficiency, biopsy requested EXAM: ULTRASOUND AND CT-GUIDED CORE RENAL BIOPSY COMPARISON:  None Available. TECHNIQUE: Survey ultrasound was performed and an appropriate skin entry site was localized. Site was marked, prepped with chlorhexidine, draped in usual sterile fashion, infiltrated locally with 1% lidocaine. Intravenous Fentanyl 771m and Versed 1.'5mg'$  were administered as conscious sedation during continuous monitoring of the patient's level of consciousness and physiological / cardiorespiratory status by the radiology RN, with a total moderate sedation time of 78 minutes. Under real time ultrasound guidance, a 15 gauge trocar needle was advanced to the margin of the lower pole of the left kidney for 2 coaxial 16 gauge core biopsy needle passes. The first was mainly fat. The second was small. Subsequently, there was limited visualization precluding additional safe core biopsies with ultrasound. For this reason, patient was transferred to CT, placed prone, and limited axial scans obtained to localize the lower pole left kidney again. There was only minimal regional hemorrhage.  Subsequently, after the region was prepped and draped and infiltrated with 1% lidocaine, the 15 gauge needle was advanced to the margin of the lower pole left kidney again and additional core samples were obtained which appeared grossly adequate. Postprocedure scan shows no significant hemorrhage or other apparent complication. The patient tolerated procedure well. COMPLICATIONS: None. IMPRESSION: 1. Technically successful ultrasound- and CT-guided core renal biopsy , left lower pole. Electronically Signed   By: D Lucrezia Europe.D.   On: 04/09/2022 15:20   ECHOCARDIOGRAM COMPLETE  Result Date: 04/06/2022    ECHOCARDIOGRAM REPORT   Patient Name:   [name] [name] Date of Exam: 04/06/2022 Medical Rec #:  [id number]       Height:       65.0 in Accession #:    [id number]      Weight:       237.2 lb Date of Birth:  03/1941-03-27       BSA:  2.127 m Patient Age:    9 years          BP:           182/72 mmHg Patient Gender: F                 HR:           76 bpm. Exam Location:  [facility] Procedure: 2D Echo, Cardiac Doppler and Color Doppler Indications:    Dyspnea  History:        Patient has no prior history of Echocardiogram examinations.                 Signs/Symptoms:Dyspnea; Risk Factors:Hypertension.  Sonographer:    Wenda Low Referring Phys: [place]  Sonographer Comments: Patient is morbidly obese. IMPRESSIONS  1. Left ventricular ejection fraction, by estimation, is 70 to 75%. Left ventricular ejection fraction by 2D MOD biplane is 71.7 %. The left ventricle has hyperdynamic function. The left ventricle has no regional wall motion abnormalities. There is mild  left ventricular hypertrophy. Left ventricular diastolic parameters are consistent with Grade I diastolic dysfunction (impaired relaxation).  2. Right ventricular systolic function is normal. The right ventricular size is normal. There is normal pulmonary artery systolic pressure. The estimated right ventricular systolic  pressure is 62.8 mmHg.  3. Left atrial size was moderately dilated.  4. The mitral valve is abnormal. Trivial mitral valve regurgitation.  5. The aortic valve is tricuspid. Aortic valve regurgitation is not visualized. Mild aortic valve stenosis. Aortic valve area, by VTI measures 1.72 cm. Aortic valve mean gradient measures 12.0 mmHg. Aortic valve Vmax measures 2.47 m/s.  6. The inferior vena cava is normal in size with greater than 50% respiratory variability, suggesting right atrial pressure of 3 mmHg. Comparison(s): No prior Echocardiogram. FINDINGS  Left Ventricle: Left ventricular ejection fraction, by estimation, is 70 to 75%. Left ventricular ejection fraction by 2D MOD biplane is 71.7 %. The left ventricle has hyperdynamic function. The left ventricle has no regional wall motion abnormalities. The left ventricular internal cavity size was normal in size. There is mild left ventricular hypertrophy. Left ventricular diastolic parameters are consistent with Grade I diastolic dysfunction (impaired relaxation). Indeterminate filling pressures. Right Ventricle: The right ventricular size is normal. No increase in right ventricular wall thickness. Right ventricular systolic function is normal. There is normal pulmonary artery systolic pressure. The tricuspid regurgitant velocity is 2.34 m/s, and  with an assumed right atrial pressure of 3 mmHg, the estimated right ventricular systolic pressure is 31.5 mmHg. Left Atrium: Left atrial size was moderately dilated. Right Atrium: Right atrial size was normal in size. Pericardium: There is no evidence of pericardial effusion. Mitral Valve: The mitral valve is abnormal. There is mild calcification of the anterior and posterior mitral valve leaflet(s). Trivial mitral valve regurgitation. MV peak gradient, 10.9 mmHg. The mean mitral valve gradient is 3.0 mmHg. Tricuspid Valve: The tricuspid valve is grossly normal. Tricuspid valve regurgitation is trivial. Aortic Valve:  The aortic valve is tricuspid. Aortic valve regurgitation is not visualized. Mild aortic stenosis is present. Aortic valve mean gradient measures 12.0 mmHg. Aortic valve peak gradient measures 24.4 mmHg. Aortic valve area, by VTI measures 1.72 cm. Pulmonic Valve: The pulmonic valve was normal in structure. Pulmonic valve regurgitation is not visualized. Aorta: The aortic root and ascending aorta are structurally normal, with no evidence of dilitation. Venous: The inferior vena cava is normal in size with greater than 50% respiratory variability, suggesting right atrial  pressure of 3 mmHg. IAS/Shunts: No atrial level shunt detected by color flow Doppler.  LEFT VENTRICLE PLAX 2D                        Biplane EF (MOD) LVIDd:         4.80 cm         LV Biplane EF:   Left LVIDs:         2.80 cm                          ventricular LV PW:         1.30 cm                          ejection LV IVS:        1.30 cm                          fraction by LVOT diam:     2.00 cm                          2D MOD LV SV:         89                               biplane is LV SV Index:   42                               71.7 %. LVOT Area:     3.14 cm                                Diastology                                LV e' medial:    6.85 cm/s LV Volumes (MOD)               LV E/e' medial:  11.6 LV vol d, MOD    74.7 ml       LV e' lateral:   11.20 cm/s A2C:                           LV E/e' lateral: 7.1 LV vol d, MOD    70.5 ml A4C: LV vol s, MOD    20.1 ml A2C: LV vol s, MOD    21.5 ml A4C: LV SV MOD A2C:   54.6 ml LV SV MOD A4C:   70.5 ml LV SV MOD BP:    52.6 ml RIGHT VENTRICLE RV Basal diam:  3.35 cm RV Mid diam:    3.00 cm RV S prime:     14.90 cm/s TAPSE (M-mode): 3.3 cm LEFT ATRIUM             Index        RIGHT ATRIUM           Index LA diam:        4.10 cm 1.93 cm/m   RA Area:     18.90 cm LA Vol (A2C):  62.0 ml 29.15 ml/m  RA Volume:   53.10 ml  24.97 ml/m LA Vol (A4C):   81.6 ml 38.37 ml/m LA Biplane Vol: 76.8  ml 36.11 ml/m  AORTIC VALVE                     PULMONIC VALVE AV Area (Vmax):    1.97 cm      PV Vmax:       1.15 m/s AV Area (Vmean):   1.78 cm      PV Peak grad:  5.3 mmHg AV Area (VTI):     1.72 cm AV Vmax:           247.00 cm/s AV Vmean:          159.500 cm/s AV VTI:            0.516 m AV Peak Grad:      24.4 mmHg AV Mean Grad:      12.0 mmHg LVOT Vmax:         155.00 cm/s LVOT Vmean:        90.400 cm/s LVOT VTI:          0.283 m LVOT/AV VTI ratio: 0.55  AORTA Ao Root diam: 2.80 cm MITRAL VALVE                TRICUSPID VALVE MV Area (PHT): 3.21 cm     TR Peak grad:   21.9 mmHg MV Area VTI:   1.99 cm     TR Vmax:        234.00 cm/s MV Peak grad:  10.9 mmHg MV Mean grad:  3.0 mmHg     SHUNTS MV Vmax:       1.65 m/s     Systemic VTI:  0.28 m MV Vmean:      83.0 cm/s    Systemic Diam: 2.00 cm MV Decel Time: 236 msec MV E velocity: 79.50 cm/s MV A velocity: 141.00 cm/s MV E/A ratio:  0.56 Lyman Bishop MD Electronically signed by Lyman Bishop MD Signature Date/Time: 04/06/2022/6:10:02 PM    Final    US Venous Img Lower Bilateral (DVT)  Result Date: 04/06/2022 CLINICAL DATA:  81 year old female with dyspnea for 2 weeks and swelling of the lower extremity EXAM: BILATERAL LOWER EXTREMITY VENOUS DOPPLER ULTRASOUND TECHNIQUE: Gray-scale sonography with graded compression, as well as color Doppler and duplex ultrasound were performed to evaluate the lower extremity deep venous systems from the level of the common femoral vein and including the common femoral, femoral, profunda femoral, popliteal and calf veins including the posterior tibial, peroneal and gastrocnemius veins when visible. The superficial great saphenous vein was also interrogated. Spectral Doppler was utilized to evaluate flow at rest and with distal augmentation maneuvers in the common femoral, femoral and popliteal veins. COMPARISON:  None Available. FINDINGS: RIGHT LOWER EXTREMITY Common Femoral Vein: No evidence of thrombus. Normal  compressibility, respiratory phasicity and response to augmentation. Saphenofemoral Junction: No evidence of thrombus. Normal compressibility and flow on color Doppler imaging. Profunda Femoral Vein: No evidence of thrombus. Normal compressibility and flow on color Doppler imaging. Femoral Vein: No evidence of thrombus. Normal compressibility, respiratory phasicity and response to augmentation. Popliteal Vein: No evidence of thrombus. Normal compressibility, respiratory phasicity and response to augmentation. Calf Veins: No evidence of thrombus. Normal compressibility and flow on color Doppler imaging. Superficial Great Saphenous Vein: No evidence of thrombus. Normal compressibility and flow on color Doppler imaging Other Findings:  None. LEFT LOWER EXTREMITY Common  Femoral Vein: No evidence of thrombus. Normal compressibility, respiratory phasicity and response to augmentation. Saphenofemoral Junction: No evidence of thrombus. Normal compressibility and flow on color Doppler imaging. Profunda Femoral Vein: No evidence of thrombus. Normal compressibility and flow on color Doppler imaging. Femoral Vein: No evidence of thrombus. Normal compressibility, respiratory phasicity and response to augmentation. Popliteal Vein: No evidence of thrombus. Normal compressibility, respiratory phasicity and response to augmentation. Calf Veins: Occlusive thrombus of the posterior tibial vein, noncompressible. Peroneal vein patent and compressible with flow maintained. Anterior tibial vein patent. Superficial Great Saphenous Vein: No evidence of thrombus. Normal compressibility and flow on color Doppler imaging. Other Findings:  None. IMPRESSION: Directed duplex left lower extremity positive for distal DVT of the posterior tibial vein. Negative for proximal left DVT. Directed duplex right lower extremity negative for DVT. These results will be called to the ordering clinician or representative by the Radiologist Assistant, and  communication documented in the PACS or [employer]. Signed, Dulcy Fanny. Nadene Rubins, RPVI [facility] Radiology Electronically Signed   By: Corrie Mckusick D.O.   On: 04/06/2022 12:00   US Renal  Result Date: 04/06/2022 CLINICAL DATA:  Acute renal injury EXAM: RENAL / URINARY TRACT ULTRASOUND COMPLETE COMPARISON:  None Available. FINDINGS: Right Kidney: Renal measurements: 10.7 x 5.1 x 4.9 cm = volume: 167 mL. Two small (1.4 cm) anechoic cysts. No hydronephrosis Left Kidney: Renal measurements: 11.4 x 7.1 x 5.6 cm = volume: 237 mL. Single small 2.3 cm anechoic cyst. No hydronephrosis. Bladder: Appears normal for degree of bladder distention. Other: None. IMPRESSION: 1. No hydronephrosis. 2. Bilateral benign-appearing renal cysts. 3. Normal bladder. Electronically Signed   By: Suzy Bouchard M.D.   On: 04/06/2022 10:39   DG Chest 2 View  Result Date: 04/05/2022 CLINICAL DATA:  Pt c/o sob and weakness for several days. Denies cp, fever or n/v. HX of hypertension and CA. EXAM: CHEST - 2 VIEW COMPARISON:  01/08/2020. FINDINGS: Mild enlargement of the cardiopericardial silhouette. No mediastinal or hilar masses. No evidence of adenopathy. Clear lungs.  No pleural effusion or pneumothorax. Skeletal structures are intact. IMPRESSION: No acute cardiopulmonary disease. Electronically Signed   By: Lajean Manes M.D.   On: 04/05/2022 14:04    Orson Eva, DO  [facility]  If 7PM-7AM, please contact night-coverage [email or web address] Password [facility] 04/09/2022, 6:16 PM   LOS: 3 days

## 2022-04-09 NOTE — Procedures (Signed)
  Procedure:  US/CT guided renal biopsy [facility] Preprocedure diagnosis: Renal insufficiency Postprocedure diagnosis: same EBL:    minimal Complications:   none immediate  See full dictation in [facility].  Dillard Cannon MD Main # [phone] Pager  [phone] Mobile [phone]

## 2022-04-09 NOTE — Sedation Documentation (Signed)
Dr. Vernard Gambles unable to obtain sufficient biosy in [place] and wishing to continue in CT. Pt to be transitioned to CT to finish sedation and procedure. Agricultural consultant notified.

## 2022-04-09 NOTE — Progress Notes (Signed)
ANTICOAGULATION CONSULT NOTE -  Pharmacy Consult for Heparin Indication:  LLE DVT 04/06/22  Allergies  Allergen Reactions   Ace Inhibitors Swelling   Sulfa Antibiotics Swelling and Rash    Patient Measurements: Height: '5\' 5"'$  (165.1 cm) Weight: 107.6 kg (237 lb 3.4 oz) IBW/kg (Calculated) : 57 Heparin Dosing Weight: 82.2 kg  Labs: Recent Labs    04/07/22 0514 04/07/22 0742 04/08/22 0452 04/08/22 1920 04/09/22 0531  HGB 8.1*  --  8.1*  --  8.2*  HCT 26.2*  --  25.7*  --  25.9*  PLT 272  --  272  --  293  HEPARINUNFRC  --    < > 0.99* 0.37 0.62  CREATININE 2.75*  --  2.79*  --  3.14*   < > = values in this interval not displayed.     Estimated Creatinine Clearance: 18 mL/min (A) (by C-G formula based on SCr of 3.14 mg/dL (H)).  Assessment: 81 year old W  found to have LLE DVT 04/06/22. PMH significant for anemia, breast cancer and obesity. No anticoagulation prior to admission. Pharmacy consulted for heparin.    Heparin level 0.62- therapeutic. H&H stable. Now being stopped for IR/renal biopsy  Goal of Therapy:  Heparin level 0.3-0.7 units/ml Monitor platelets by anticoagulation protocol: Yes   Plan:  Continue Heparin gtt to 1000 units/hr when indicated post procedure Monitor daily heparin level, CBC Monitor for signs/symptoms of bleeding   F/u longterm anticoagulation plan   Margot Ables, PharmD Clinical Pharmacist 04/09/2022 7:57 AM

## 2022-04-09 NOTE — Progress Notes (Signed)
Brief nephrology note:  Patient went to [facility], IR for kidney biopsy. I reviewed the chart. Holding hydralazine, follow-up antihistone antibody.  Continue prednisone and will follow biopsy result. Continue amlodipine and labetalol for the management of hypertension. Repeat lab in the morning.  I have discussed with the nurse at [facility].  When patient arrives she will need bedrest, monitor urine output.  Resume heparin per IR's protocol. Please call us with question.  We will continue to follow.  [facility].

## 2022-04-09 NOTE — Progress Notes (Signed)
CT biopsy site. Left lower back. Band-aid placed. Clean, dry and intact.  Unable to document in as a wound, due to being locked out of the IR documentation.

## 2022-04-10 DIAGNOSIS — N179 Acute kidney failure, unspecified: Secondary | ICD-10-CM | POA: Diagnosis not present

## 2022-04-10 DIAGNOSIS — N009 Acute nephritic syndrome with unspecified morphologic changes: Secondary | ICD-10-CM | POA: Diagnosis not present

## 2022-04-10 DIAGNOSIS — I82402 Acute embolism and thrombosis of unspecified deep veins of left lower extremity: Secondary | ICD-10-CM | POA: Diagnosis not present

## 2022-04-10 LAB — RENAL FUNCTION PANEL
Albumin: 2.9 g/dL — ABNORMAL LOW (ref 3.5–5.0)
Anion gap: 7 (ref 5–15)
BUN: 48 mg/dL — ABNORMAL HIGH (ref 8–23)
CO2: 24 mmol/L (ref 22–32)
Calcium: 9.9 mg/dL (ref 8.9–10.3)
Chloride: 105 mmol/L (ref 98–111)
Creatinine, Ser: 2.9 mg/dL — ABNORMAL HIGH (ref 0.44–1.00)
GFR, Estimated: 16 mL/min — ABNORMAL LOW (ref >60)
Glucose, Bld: 113 mg/dL — ABNORMAL HIGH (ref 70–99)
Phosphorus: 4.1 mg/dL (ref 2.5–4.6)
Potassium: 4.5 mmol/L (ref 3.5–5.1)
Sodium: 136 mmol/L (ref 135–145)

## 2022-04-10 LAB — UPEP/UIFE/LIGHT CHAINS/TP, 24-HR UR
% BETA, Urine: 14.3 %
ALPHA 1 URINE: 6.3 %
Albumin, U: 48.1 %
Alpha 2, Urine: 12.3 %
Free Kappa Lt Chains,Ur: 386.18 mg/L — ABNORMAL HIGH (ref 1.17–86.46)
Free Kappa/Lambda Ratio: 6.05 (ref 1.83–14.26)
Free Lambda Lt Chains,Ur: 63.81 mg/L — ABNORMAL HIGH (ref 0.27–15.21)
GAMMA GLOBULIN URINE: 19 %
Total Protein, Urine-Ur/day: 2259 mg/24 hr — ABNORMAL HIGH (ref 30–150)
Total Protein, Urine: 158.5 mg/dL
Total Volume: 1425

## 2022-04-10 LAB — CBC
HCT: 28.2 % — ABNORMAL LOW (ref 36.0–46.0)
Hemoglobin: 8.7 g/dL — ABNORMAL LOW (ref 12.0–15.0)
MCH: 28.2 pg (ref 26.0–34.0)
MCHC: 30.9 g/dL (ref 30.0–36.0)
MCV: 91.3 fL (ref 80.0–100.0)
Platelets: 326 10*3/uL (ref 150–400)
RBC: 3.09 MIL/uL — ABNORMAL LOW (ref 3.87–5.11)
RDW: 14.7 % (ref 11.5–15.5)
WBC: 8.6 10*3/uL (ref 4.0–10.5)
nRBC: 0 % (ref 0.0–0.2)

## 2022-04-10 LAB — HEPARIN LEVEL (UNFRACTIONATED)
Heparin Unfractionated: 0.27 IU/mL — ABNORMAL LOW (ref 0.30–0.70)
Heparin Unfractionated: 0.55 IU/mL (ref 0.30–0.70)

## 2022-04-10 MED ORDER — DARBEPOETIN ALFA 60 MCG/0.3ML IJ SOSY
60.0000 ug | PREFILLED_SYRINGE | Freq: Once | INTRAMUSCULAR | Status: AC
  Administered 2022-04-10: 60 ug via SUBCUTANEOUS
  Filled 2022-04-10: qty 0.3

## 2022-04-10 NOTE — Progress Notes (Signed)
PROGRESS NOTE  [name] [name] YWV:[id number] DOB: Mar 07, 1941 DOA: 04/05/2022 PCP: Monico Blitz, MD  Brief History:  As per H&P written by Dr. Denton Brick on 04/05/2022 [name] [name] is a 81 y.o. female with medical history significant for recurrent breast cancer, Fe deficiency anemia, hyperparathyroidism, hypertension. Patient presented to the ED with intermittent difficulty breathing over the past 2 weeks.  Symptoms are mostly with exertion.  She denies cough.  No chest pain.  She reports swelling of her lower extremities today.  She feels her abdomen is bloated.  Checks her weight regularly, she reports there has been no change. She denies vomiting, no loose stools.  She has maintained good oral intake.  Not on any diuretics.   ED Course:  Temp 97.6.  Heart rate 70s to 80s.  Respiratory rate 13-24.  Blood pressure systolic 854O to 270J. Creatinine elevated at 3.16, BNP 73.  Chest x-ray without acute abnormality. 1 L bolus given.  Renal ultrasound ordered which did not show hydronephrosis.  Her renal function continued to worsen.  Nephrology was consulted.  Auto-antibodies were ordered and showed positive ANA, anti-dsDNA, anti-histone, P-ANCA.  She was started on empiric prednisone.  Renal biopsy was peformed ont 6/15.  She was found to have LLE DVT for which she was started on IV heparin.  This was held for her renal biopsy and restarted 12 hours after.      Assessment and Plan: * AKI (acute kidney injury) ([place]) -Cr-3.16, per Care Everywhere last check 4/24 creatinine was 1.9.  -positive autoantibodies suggest hydralazine induced GN -appreciate renal  -ANA and anti-dsDNA positive -PANCA positive -anti-histone antibody positive -SPEP neg M-spike -renal US without hydronephrosis. 04/09/22--planning renal biopsy -antiGBM, C3, C4 neg -empiric prednisone started 6/14 -serum creatinine appears to be reaching plateau   Left leg DVT ([facility]) -Positive lower extremity Dopplers for  left distal leg DVT -Heparin initiated. -At least 6 months of anticoagulation using Eliquis will be required at discharge -Sedentarism and prior history of breast cancer are risk factors -continue IV heparin>>held for renal biopsy 6/15 -restarted AC 6/16>>monitor CBC  Obesity, Class III, BMI 40-49.9 (morbid obesity) ([place]) -Body mass index is 39.47 kg/m. -Lifestyle modification  Dyspnea -Dyspnea with exertion. -suspect symptomatic anemia -1 unit PRBCs transfused >>dyspnea improved; stable on RA -04/06/22 echo G1DD LV with hyperdynamic function, no wall motion abnormalities, ejection fraction 70 to 75%.  No significant valvular disorder.   -Lower extremity Doppler with positive left leg DVT; therapy with anticoagulation will be initiated.      HTN (hypertension) -Systolic 500X to 381W at time of admission. -Continue amlodipine -hydralazine discontinued due to concern for drug induced GN  Malignant neoplasm of upper outer quadrant of female breast ([place]) -Status post lumpectomy, radiation treatment, currently on anastrozole. -Continue patient follow-up with oncology service.  Anemia, iron deficiency -In the setting of anemia chronic kidney disease -No overt bleeding appreciated -Given symptomatic symptoms after discussing with nephrology service 1 unit of PRBCs transfused -IV iron and Epogen therapy as per renal discretion. -Current hemoglobin after transfusion 8.1>>8.7   Family Communication:  no Family at bedside   Consultants:  renal   Code Status:  FULL    DVT Prophylaxis:  IV  Heparin on hold 6/15 for renal bx>>restarted 6/16   Procedures: As Listed in Progress Note Above   Antibiotics: None            Subjective: Patient denies fevers, chills, headache, chest pain, dyspnea,  nausea, vomiting, diarrhea, abdominal pain, dysuria, hematuria, hematochezia, and melena.   Objective: Vitals:   04/09/22 1616 04/09/22 2000 04/10/22 0438 04/10/22 1340  BP: (!)  158/61 (!) 167/59 (!) 159/62 (!) 156/55  Pulse: 66 86 81 73  Resp: '20 18 18 18  '$ Temp: 98.2 F (36.8 C) 98.8 F (37.1 C) 98.8 F (37.1 C) 97.8 F (36.6 C)  TempSrc: Oral Oral Oral Oral  SpO2: 95% 94% 94% 96%  Weight:      Height:        Intake/Output Summary (Last 24 hours) at 04/10/2022 1753 Last data filed at 04/10/2022 1300 Gross per 24 hour  Intake 1200 ml  Output 500 ml  Net 700 ml   Weight change:  Exam:  General:  Pt is alert, follows commands appropriately, not in acute distress HEENT: No icterus, No thrush, No neck mass, JAARS/AT Cardiovascular: RRR, S1/S2, no rubs, no gallops Respiratory: CTA bilaterally, no wheezing, no crackles, no rhonchi Abdomen: Soft/+BS, non tender, non distended, no guarding Extremities: trace LE edema, No lymphangitis, No petechiae, No rashes, no synovitis   Data Reviewed: I have personally reviewed following labs and imaging studies Basic Metabolic Panel: Recent Labs  Lab 04/06/22 0444 04/07/22 0514 04/08/22 0452 04/09/22 0531 04/10/22 0454  NA 139 139 136 134* 136  K 4.1 3.8 3.8 4.1 4.5  CL 111 110 107 106 105  CO2 '27 24 24 24 24  '$ GLUCOSE 102* 100* 106* 110* 113*  BUN 34* 32* 31* 41* 48*  CREATININE 2.99* 2.75* 2.79* 3.14* 2.90*  CALCIUM 9.2 9.1 9.2 9.5 9.9  PHOS  --  3.2 3.0 3.5 4.1   Liver Function Tests: Recent Labs  Lab 04/07/22 0514 04/08/22 0452 04/09/22 0531 04/10/22 0454  ALBUMIN 2.7* 2.6* 2.7* 2.9*   No results for input(s): "LIPASE", "AMYLASE" in the last 168 hours. No results for input(s): "AMMONIA" in the last 168 hours. Coagulation Profile: Recent Labs  Lab 04/09/22 0920  INR 1.0   CBC: Recent Labs  Lab 04/06/22 0444 04/07/22 0514 04/08/22 0452 04/09/22 0531 04/10/22 0454  WBC 5.3 6.8 6.3 7.0 8.6  HGB 7.6* 8.1* 8.1* 8.2* 8.7*  HCT 24.2* 26.2* 25.7* 25.9* 28.2*  MCV 92.4 91.6 90.8 90.2 91.3  PLT 284 272 272 293 326   Cardiac Enzymes: No results for input(s): "CKTOTAL", "CKMB", "CKMBINDEX",  "TROPONINI" in the last 168 hours. BNP: Invalid input(s): "POCBNP" CBG: No results for input(s): "GLUCAP" in the last 168 hours. HbA1C: No results for input(s): "HGBA1C" in the last 72 hours. Urine analysis:    Component Value Date/Time   COLORURINE STRAW (A) 04/05/2022 1650   APPEARANCEUR CLEAR 04/05/2022 1650   LABSPEC 1.008 04/05/2022 1650   PHURINE 8.0 04/05/2022 1650   GLUCOSEU NEGATIVE 04/05/2022 1650   HGBUR MODERATE (A) 04/05/2022 1650   BILIRUBINUR NEGATIVE 04/05/2022 1650   KETONESUR NEGATIVE 04/05/2022 1650   PROTEINUR 100 (A) 04/05/2022 1650   UROBILINOGEN 0.2 02/03/2010 1200   NITRITE NEGATIVE 04/05/2022 1650   LEUKOCYTESUR NEGATIVE 04/05/2022 1650   Sepsis Labs: '@LABRCNTIP'$ (procalcitonin:4,lacticidven:4) )No results found for this or any previous visit (from the past 240 hour(s)).   Scheduled Meds:  amLODipine  10 mg Oral Daily   anastrozole  1 mg Oral Daily   brimonidine  1 drop Both Eyes BID   And   timolol  1 drop Both Eyes BID   pantoprazole  40 mg Oral Daily   predniSONE  60 mg Oral Daily   Continuous Infusions:  heparin 1,100 Units/hr (  04/10/22 1104)    Procedures/Studies: US BIOPSY (KIDNEY)  Result Date: 04/09/2022 CLINICAL DATA:  Renal insufficiency, biopsy requested EXAM: ULTRASOUND AND CT-GUIDED CORE RENAL BIOPSY COMPARISON:  None Available. TECHNIQUE: Survey ultrasound was performed and an appropriate skin entry site was localized. Site was marked, prepped with chlorhexidine, draped in usual sterile fashion, infiltrated locally with 1% lidocaine. Intravenous Fentanyl 1mg and Versed 1.'5mg'$  were administered as conscious sedation during continuous monitoring of the patient's level of consciousness and physiological / cardiorespiratory status by the radiology RN, with a total moderate sedation time of 78 minutes. Under real time ultrasound guidance, a 15 gauge trocar needle was advanced to the margin of the lower pole of the left kidney for 2 coaxial  16 gauge core biopsy needle passes. The first was mainly fat. The second was small. Subsequently, there was limited visualization precluding additional safe core biopsies with ultrasound. For this reason, patient was transferred to CT, placed prone, and limited axial scans obtained to localize the lower pole left kidney again. There was only minimal regional hemorrhage. Subsequently, after the region was prepped and draped and infiltrated with 1% lidocaine, the 15 gauge needle was advanced to the margin of the lower pole left kidney again and additional core samples were obtained which appeared grossly adequate. Postprocedure scan shows no significant hemorrhage or other apparent complication. The patient tolerated procedure well. COMPLICATIONS: None. IMPRESSION: 1. Technically successful ultrasound- and CT-guided core renal biopsy , left lower pole. Electronically Signed   By: DLucrezia EuropeM.D.   On: 04/09/2022 15:20   CT RENAL BIOPSY  Result Date: 04/09/2022 CLINICAL DATA:  Renal insufficiency, biopsy requested EXAM: ULTRASOUND AND CT-GUIDED CORE RENAL BIOPSY COMPARISON:  None Available. TECHNIQUE: Survey ultrasound was performed and an appropriate skin entry site was localized. Site was marked, prepped with chlorhexidine, draped in usual sterile fashion, infiltrated locally with 1% lidocaine. Intravenous Fentanyl 787m and Versed 1.'5mg'$  were administered as conscious sedation during continuous monitoring of the patient's level of consciousness and physiological / cardiorespiratory status by the radiology RN, with a total moderate sedation time of 78 minutes. Under real time ultrasound guidance, a 15 gauge trocar needle was advanced to the margin of the lower pole of the left kidney for 2 coaxial 16 gauge core biopsy needle passes. The first was mainly fat. The second was small. Subsequently, there was limited visualization precluding additional safe core biopsies with ultrasound. For this reason, patient was  transferred to CT, placed prone, and limited axial scans obtained to localize the lower pole left kidney again. There was only minimal regional hemorrhage. Subsequently, after the region was prepped and draped and infiltrated with 1% lidocaine, the 15 gauge needle was advanced to the margin of the lower pole left kidney again and additional core samples were obtained which appeared grossly adequate. Postprocedure scan shows no significant hemorrhage or other apparent complication. The patient tolerated procedure well. COMPLICATIONS: None. IMPRESSION: 1. Technically successful ultrasound- and CT-guided core renal biopsy , left lower pole. Electronically Signed   By: D Lucrezia Europe.D.   On: 04/09/2022 15:20   ECHOCARDIOGRAM COMPLETE  Result Date: 04/06/2022    ECHOCARDIOGRAM REPORT   Patient Name:   [name] [name] Date of Exam: 04/06/2022 Medical Rec #:  [id number]       Height:       65.0 in Accession #:    [id number]      Weight:       237.2 lb Date of Birth:  1940-12-29         BSA:          2.127 m Patient Age:    74 years          BP:           182/72 mmHg Patient Gender: F                 HR:           76 bpm. Exam Location:  [facility] Procedure: 2D Echo, Cardiac Doppler and Color Doppler Indications:    Dyspnea  History:        Patient has no prior history of Echocardiogram examinations.                 Signs/Symptoms:Dyspnea; Risk Factors:Hypertension.  Sonographer:    Wenda Low Referring Phys: [place]  Sonographer Comments: Patient is morbidly obese. IMPRESSIONS  1. Left ventricular ejection fraction, by estimation, is 70 to 75%. Left ventricular ejection fraction by 2D MOD biplane is 71.7 %. The left ventricle has hyperdynamic function. The left ventricle has no regional wall motion abnormalities. There is mild  left ventricular hypertrophy. Left ventricular diastolic parameters are consistent with Grade I diastolic dysfunction (impaired relaxation).  2. Right ventricular systolic  function is normal. The right ventricular size is normal. There is normal pulmonary artery systolic pressure. The estimated right ventricular systolic pressure is 95.2 mmHg.  3. Left atrial size was moderately dilated.  4. The mitral valve is abnormal. Trivial mitral valve regurgitation.  5. The aortic valve is tricuspid. Aortic valve regurgitation is not visualized. Mild aortic valve stenosis. Aortic valve area, by VTI measures 1.72 cm. Aortic valve mean gradient measures 12.0 mmHg. Aortic valve Vmax measures 2.47 m/s.  6. The inferior vena cava is normal in size with greater than 50% respiratory variability, suggesting right atrial pressure of 3 mmHg. Comparison(s): No prior Echocardiogram. FINDINGS  Left Ventricle: Left ventricular ejection fraction, by estimation, is 70 to 75%. Left ventricular ejection fraction by 2D MOD biplane is 71.7 %. The left ventricle has hyperdynamic function. The left ventricle has no regional wall motion abnormalities. The left ventricular internal cavity size was normal in size. There is mild left ventricular hypertrophy. Left ventricular diastolic parameters are consistent with Grade I diastolic dysfunction (impaired relaxation). Indeterminate filling pressures. Right Ventricle: The right ventricular size is normal. No increase in right ventricular wall thickness. Right ventricular systolic function is normal. There is normal pulmonary artery systolic pressure. The tricuspid regurgitant velocity is 2.34 m/s, and  with an assumed right atrial pressure of 3 mmHg, the estimated right ventricular systolic pressure is 84.1 mmHg. Left Atrium: Left atrial size was moderately dilated. Right Atrium: Right atrial size was normal in size. Pericardium: There is no evidence of pericardial effusion. Mitral Valve: The mitral valve is abnormal. There is mild calcification of the anterior and posterior mitral valve leaflet(s). Trivial mitral valve regurgitation. MV peak gradient, 10.9 mmHg. The  mean mitral valve gradient is 3.0 mmHg. Tricuspid Valve: The tricuspid valve is grossly normal. Tricuspid valve regurgitation is trivial. Aortic Valve: The aortic valve is tricuspid. Aortic valve regurgitation is not visualized. Mild aortic stenosis is present. Aortic valve mean gradient measures 12.0 mmHg. Aortic valve peak gradient measures 24.4 mmHg. Aortic valve area, by VTI measures 1.72 cm. Pulmonic Valve: The pulmonic valve was normal in structure. Pulmonic valve regurgitation is not visualized. Aorta: The aortic root and ascending aorta are structurally normal, with no evidence of  dilitation. Venous: The inferior vena cava is normal in size with greater than 50% respiratory variability, suggesting right atrial pressure of 3 mmHg. IAS/Shunts: No atrial level shunt detected by color flow Doppler.  LEFT VENTRICLE PLAX 2D                        Biplane EF (MOD) LVIDd:         4.80 cm         LV Biplane EF:   Left LVIDs:         2.80 cm                          ventricular LV PW:         1.30 cm                          ejection LV IVS:        1.30 cm                          fraction by LVOT diam:     2.00 cm                          2D MOD LV SV:         89                               biplane is LV SV Index:   42                               71.7 %. LVOT Area:     3.14 cm                                Diastology                                LV e' medial:    6.85 cm/s LV Volumes (MOD)               LV E/e' medial:  11.6 LV vol d, MOD    74.7 ml       LV e' lateral:   11.20 cm/s A2C:                           LV E/e' lateral: 7.1 LV vol d, MOD    70.5 ml A4C: LV vol s, MOD    20.1 ml A2C: LV vol s, MOD    21.5 ml A4C: LV SV MOD A2C:   54.6 ml LV SV MOD A4C:   70.5 ml LV SV MOD BP:    52.6 ml RIGHT VENTRICLE RV Basal diam:  3.35 cm RV Mid diam:    3.00 cm RV S prime:     14.90 cm/s TAPSE (M-mode): 3.3 cm LEFT ATRIUM             Index        RIGHT ATRIUM           Index LA diam:  4.10 cm 1.93 cm/m    RA Area:     18.90 cm LA Vol (A2C):   62.0 ml 29.15 ml/m  RA Volume:   53.10 ml  24.97 ml/m LA Vol (A4C):   81.6 ml 38.37 ml/m LA Biplane Vol: 76.8 ml 36.11 ml/m  AORTIC VALVE                     PULMONIC VALVE AV Area (Vmax):    1.97 cm      PV Vmax:       1.15 m/s AV Area (Vmean):   1.78 cm      PV Peak grad:  5.3 mmHg AV Area (VTI):     1.72 cm AV Vmax:           247.00 cm/s AV Vmean:          159.500 cm/s AV VTI:            0.516 m AV Peak Grad:      24.4 mmHg AV Mean Grad:      12.0 mmHg LVOT Vmax:         155.00 cm/s LVOT Vmean:        90.400 cm/s LVOT VTI:          0.283 m LVOT/AV VTI ratio: 0.55  AORTA Ao Root diam: 2.80 cm MITRAL VALVE                TRICUSPID VALVE MV Area (PHT): 3.21 cm     TR Peak grad:   21.9 mmHg MV Area VTI:   1.99 cm     TR Vmax:        234.00 cm/s MV Peak grad:  10.9 mmHg MV Mean grad:  3.0 mmHg     SHUNTS MV Vmax:       1.65 m/s     Systemic VTI:  0.28 m MV Vmean:      83.0 cm/s    Systemic Diam: 2.00 cm MV Decel Time: 236 msec MV E velocity: 79.50 cm/s MV A velocity: 141.00 cm/s MV E/A ratio:  0.56 Lyman Bishop MD Electronically signed by Lyman Bishop MD Signature Date/Time: 04/06/2022/6:10:02 PM    Final    US Venous Img Lower Bilateral (DVT)  Result Date: 04/06/2022 CLINICAL DATA:  81 year old female with dyspnea for 2 weeks and swelling of the lower extremity EXAM: BILATERAL LOWER EXTREMITY VENOUS DOPPLER ULTRASOUND TECHNIQUE: Gray-scale sonography with graded compression, as well as color Doppler and duplex ultrasound were performed to evaluate the lower extremity deep venous systems from the level of the common femoral vein and including the common femoral, femoral, profunda femoral, popliteal and calf veins including the posterior tibial, peroneal and gastrocnemius veins when visible. The superficial great saphenous vein was also interrogated. Spectral Doppler was utilized to evaluate flow at rest and with distal augmentation maneuvers in the common femoral,  femoral and popliteal veins. COMPARISON:  None Available. FINDINGS: RIGHT LOWER EXTREMITY Common Femoral Vein: No evidence of thrombus. Normal compressibility, respiratory phasicity and response to augmentation. Saphenofemoral Junction: No evidence of thrombus. Normal compressibility and flow on color Doppler imaging. Profunda Femoral Vein: No evidence of thrombus. Normal compressibility and flow on color Doppler imaging. Femoral Vein: No evidence of thrombus. Normal compressibility, respiratory phasicity and response to augmentation. Popliteal Vein: No evidence of thrombus. Normal compressibility, respiratory phasicity and response to augmentation. Calf Veins: No evidence of thrombus. Normal compressibility and flow on color Doppler imaging. Superficial Great Saphenous Vein: No  evidence of thrombus. Normal compressibility and flow on color Doppler imaging Other Findings:  None. LEFT LOWER EXTREMITY Common Femoral Vein: No evidence of thrombus. Normal compressibility, respiratory phasicity and response to augmentation. Saphenofemoral Junction: No evidence of thrombus. Normal compressibility and flow on color Doppler imaging. Profunda Femoral Vein: No evidence of thrombus. Normal compressibility and flow on color Doppler imaging. Femoral Vein: No evidence of thrombus. Normal compressibility, respiratory phasicity and response to augmentation. Popliteal Vein: No evidence of thrombus. Normal compressibility, respiratory phasicity and response to augmentation. Calf Veins: Occlusive thrombus of the posterior tibial vein, noncompressible. Peroneal vein patent and compressible with flow maintained. Anterior tibial vein patent. Superficial Great Saphenous Vein: No evidence of thrombus. Normal compressibility and flow on color Doppler imaging. Other Findings:  None. IMPRESSION: Directed duplex left lower extremity positive for distal DVT of the posterior tibial vein. Negative for proximal left DVT. Directed duplex right  lower extremity negative for DVT. These results will be called to the ordering clinician or representative by the Radiologist Assistant, and communication documented in the PACS or [employer]. Signed, Dulcy Fanny. Nadene Rubins, RPVI [facility] Electronically Signed   By: Corrie Mckusick D.O.   On: 04/06/2022 12:00   US Renal  Result Date: 04/06/2022 CLINICAL DATA:  Acute renal injury EXAM: RENAL / URINARY TRACT ULTRASOUND COMPLETE COMPARISON:  None Available. FINDINGS: Right Kidney: Renal measurements: 10.7 x 5.1 x 4.9 cm = volume: 167 mL. Two small (1.4 cm) anechoic cysts. No hydronephrosis Left Kidney: Renal measurements: 11.4 x 7.1 x 5.6 cm = volume: 237 mL. Single small 2.3 cm anechoic cyst. No hydronephrosis. Bladder: Appears normal for degree of bladder distention. Other: None. IMPRESSION: 1. No hydronephrosis. 2. Bilateral benign-appearing renal cysts. 3. Normal bladder. Electronically Signed   By: Suzy Bouchard M.D.   On: 04/06/2022 10:39   DG Chest 2 View  Result Date: 04/05/2022 CLINICAL DATA:  Pt c/o sob and weakness for several days. Denies cp, fever or n/v. HX of hypertension and CA. EXAM: CHEST - 2 VIEW COMPARISON:  01/08/2020. FINDINGS: Mild enlargement of the cardiopericardial silhouette. No mediastinal or hilar masses. No evidence of adenopathy. Clear lungs.  No pleural effusion or pneumothorax. Skeletal structures are intact. IMPRESSION: No acute cardiopulmonary disease. Electronically Signed   By: Lajean Manes M.D.   On: 04/05/2022 14:04    Orson Eva, DO  [facility]  If 7PM-7AM, please contact night-coverage [email or web address] Password [email or web address] 04/10/2022, 5:53 PM   LOS: 4 days

## 2022-04-10 NOTE — Evaluation (Signed)
Physical Therapy Evaluation Patient Details Name: [name] [name] MRN: [id number] DOB: Sep 09, 1941 Today's Date: 04/10/2022  History of Present Illness  [name] [name] is a 81 y.o. female with medical history significant for breast cancer, anemia, hyperparathyroidism, hypertension.  Patient presented to the ED with intermittent difficulty breathing over the past 2 weeks.  Symptoms are mostly with exertion.  She denies cough.  No chest pain.  She reports swelling of her lower extremities today.  She feels her abdomen is bloated.  Checks her weight regularly, she reports there has been no change.  She denies vomiting, no loose stools.  She has maintained good oral intake.  Not on any diuretics.   Clinical Impression  Patient functioning near baseline for functional mobility and gait demonstrating good return for bed mobility, transferring to chair, commode in bathroom and ambulation in room/hallway without loss of balance.  Patient tolerated staying up in chair after therapy with her daughter present in room.  Patient will benefit from continued skilled physical therapy in hospital and recommended venue below to increase strength, balance, endurance for safe ADLs and gait.          Recommendations for follow up therapy are one component of a multi-disciplinary discharge planning process, led by the attending physician.  Recommendations may be updated based on patient status, additional functional criteria and insurance authorization.  Follow Up Recommendations Home health PT    Assistance Recommended at Discharge Set up Supervision/Assistance  Patient can return home with the following  A little help with walking and/or transfers;A little help with bathing/dressing/bathroom;Help with stairs or ramp for entrance;Assistance with cooking/housework    Equipment Recommendations None recommended by PT  Recommendations for Other Services       Functional Status Assessment Patient has had a  recent decline in their functional status and demonstrates the ability to make significant improvements in function in a reasonable and predictable amount of time.     Precautions / Restrictions Precautions Precautions: Fall Restrictions Weight Bearing Restrictions: No      Mobility  Bed Mobility Overal bed mobility: Modified Independent             General bed mobility comments: slightly labored movement    Transfers Overall transfer level: Modified independent Equipment used: Rollator (4 wheels)               General transfer comment: slightly labored movement    Ambulation/Gait Ambulation/Gait assistance: Supervision Gait Distance (Feet): 65 Feet Assistive device: Rolling walker (2 wheels) Gait Pattern/deviations: Decreased step length - right, Decreased step length - left, Decreased stride length Gait velocity: decreased     General Gait Details: slow slightly labored cadence without loss of balance, required standing rest break before returning to room, limited mostly due to fatigue  Stairs            Wheelchair Mobility    Modified Rankin (Stroke Patients Only)       Balance Overall balance assessment: Needs assistance Sitting-balance support: Feet supported, No upper extremity supported Sitting balance-Leahy Scale: Good Sitting balance - Comments: seated at EOB   Standing balance support: During functional activity, No upper extremity supported Standing balance-Leahy Scale: Fair Standing balance comment: fair/good using RW                             Pertinent Vitals/Pain Pain Assessment Pain Assessment: No/denies pain    Home Living Family/patient expects to be discharged to:: Private  residence Living Arrangements: Alone Available Help at Discharge: Family;Available PRN/intermittently Type of Home: House Home Access: Ramped entrance       Home Layout: One level Home Equipment: Conservation officer, nature (2 wheels);BSC/3in1       Prior Function Prior Level of Function : Independent/Modified Independent             Mobility Comments: Hydrographic surveyor, drives, shops ADLs Comments: Independent     Hand Dominance        Extremity/Trunk Assessment   Upper Extremity Assessment Upper Extremity Assessment: Overall WFL for tasks assessed    Lower Extremity Assessment Lower Extremity Assessment: Generalized weakness    Cervical / Trunk Assessment Cervical / Trunk Assessment: Normal  Communication   Communication: No difficulties  Cognition Arousal/Alertness: Awake/alert Behavior During Therapy: WFL for tasks assessed/performed Overall Cognitive Status: Within Functional Limits for tasks assessed                                          General Comments      Exercises     Assessment/Plan    PT Assessment Patient needs continued PT services  PT Problem List Decreased strength;Decreased activity tolerance;Decreased balance;Decreased mobility       PT Treatment Interventions DME instruction;Gait training;Stair training;Functional mobility training;Therapeutic activities;Therapeutic exercise;Patient/family education;Balance training    PT Goals (Current goals can be found in the Care Plan section)  Acute Rehab PT Goals Patient Stated Goal: return home with family to assist PT Goal Formulation: With patient/family Time For Goal Achievement: 04/21/22 Potential to Achieve Goals: Good    Frequency Min 3X/week     Co-evaluation               AM-PAC PT "6 Clicks" Mobility  Outcome Measure Help needed turning from your back to your side while in a flat bed without using bedrails?: None Help needed moving from lying on your back to sitting on the side of a flat bed without using bedrails?: None Help needed moving to and from a bed to a chair (including a wheelchair)?: A Little Help needed standing up from a chair using your arms (e.g., wheelchair or bedside chair)?:  None Help needed to walk in hospital room?: A Little Help needed climbing 3-5 steps with a railing? : A Little 6 Click Score: 21    End of Session   Activity Tolerance: Patient tolerated treatment well;Patient limited by fatigue Patient left: in chair;with call bell/phone within reach;with family/visitor present Nurse Communication: Mobility status PT Visit Diagnosis: Unsteadiness on feet (R26.81);Other abnormalities of gait and mobility (R26.89);Muscle weakness (generalized) (M62.81)    Time: 6010-9323 PT Time Calculation (min) (ACUTE ONLY): 20 min   Charges:   PT Evaluation $PT Eval Moderate Complexity: 1 Mod PT Treatments $Therapeutic Activity: 8-22 mins        3:13 PM, 04/10/22 Lonell Grandchild, MPT Physical Therapist with [facility] [phone] office [phone] mobile phone

## 2022-04-10 NOTE — Progress Notes (Signed)
ANTICOAGULATION CONSULT NOTE -  Pharmacy Consult for Heparin Indication:  LLE DVT 04/06/22  Allergies  Allergen Reactions   Ace Inhibitors Swelling   Sulfa Antibiotics Swelling and Rash    Patient Measurements: Height: '5\' 5"'$  (165.1 cm) Weight: 107.6 kg (237 lb 3.4 oz) IBW/kg (Calculated) : 57 Heparin Dosing Weight: 82.2 kg  Labs: Recent Labs    04/08/22 0452 04/08/22 1920 04/09/22 0531 04/09/22 0920 04/10/22 0454 04/10/22 0904  HGB 8.1*  --  8.2*  --  8.7*  --   HCT 25.7*  --  25.9*  --  28.2*  --   PLT 272  --  293  --  326  --   LABPROT  --   --   --  12.9  --   --   INR  --   --   --  1.0  --   --   HEPARINUNFRC 0.99* 0.37 0.62  --   --  0.27*  CREATININE 2.79*  --  3.14*  --  2.90*  --      Estimated Creatinine Clearance: 19.5 mL/min (A) (by C-G formula based on SCr of 2.9 mg/dL (H)).  Assessment: 81 year old W  found to have LLE DVT 04/06/22. PMH significant for anemia, breast cancer and obesity. No anticoagulation prior to admission. Pharmacy consulted for heparin.    Heparin level 0.27- slightly subtherapeutic. H&H stable.  Goal of Therapy:  Heparin level 0.3-0.7 units/ml Monitor platelets by anticoagulation protocol: Yes   Plan:  Increase heparin infusion to 1100 units/hr Monitor daily heparin level, CBC Monitor for signs/symptoms of bleeding   F/u longterm anticoagulation plan   Margot Ables, PharmD Clinical Pharmacist 04/10/2022 10:47 AM

## 2022-04-10 NOTE — Progress Notes (Signed)
ANTICOAGULATION CONSULT NOTE -  Pharmacy Consult for Heparin Indication:  LLE DVT 04/06/22  Allergies  Allergen Reactions   Ace Inhibitors Swelling   Sulfa Antibiotics Swelling and Rash    Patient Measurements: Height: '5\' 5"'$  (165.1 cm) Weight: 107.6 kg (237 lb 3.4 oz) IBW/kg (Calculated) : 57 Heparin Dosing Weight: 82.2 kg  Labs: Recent Labs    04/08/22 0452 04/08/22 1920 04/09/22 0531 04/09/22 0920 04/10/22 0454 04/10/22 0904 04/10/22 1851  HGB 8.1*  --  8.2*  --  8.7*  --   --   HCT 25.7*  --  25.9*  --  28.2*  --   --   PLT 272  --  293  --  326  --   --   LABPROT  --   --   --  12.9  --   --   --   INR  --   --   --  1.0  --   --   --   HEPARINUNFRC 0.99*   < > 0.62  --   --  0.27* 0.55  CREATININE 2.79*  --  3.14*  --  2.90*  --   --    < > = values in this interval not displayed.     Estimated Creatinine Clearance: 19.5 mL/min (A) (by C-G formula based on SCr of 2.9 mg/dL (H)).  Assessment: 81 year old W  found to have LLE DVT 04/06/22. PMH significant for anemia, breast cancer and obesity. No anticoagulation prior to admission. Pharmacy consulted for heparin.    Heparin level 0.55, therapeutic.  Goal of Therapy:  Heparin level 0.3-0.7 units/ml Monitor platelets by anticoagulation protocol: Yes   Plan:  Continue heparin infusion at 1100 units/hr Repeat heparin level in 8 hrs. Monitor daily heparin level, CBC Monitor for signs/symptoms of bleeding   Assunta Curtis, [place] Clinical Pharmacist 04/10/2022 8:14 PM

## 2022-04-10 NOTE — Care Management Important Message (Signed)
Important Message  Patient Details  Name: [name] [name] MRN: [id number] Date of Birth: [birth year]   Medicare Important Message Given:  Yes     Tommy Medal 04/10/2022, 3:34 PM

## 2022-04-10 NOTE — Progress Notes (Addendum)
[facility] NEPHROLOGY PROGRESS NOTE  Assessment/ Plan: Pt is a 81 y.o. yo female with history of hypertension, breast cancer status postlumpectomy, radiation and currently on anastrozole, anemia presented with shortness of breath, seen in consultation for acute kidney injury.  #Acute kidney injury, nonoliguric : The creatinine level was first noted to be elevated to 1.9 in 01/2022, now presented with creatinine level of 3.16 on admission associated with proteinuria (UPC 3.3) and microscopic hematuria. The kidney ultrasound unremarkable except bilateral benign-appearing cyst.  The GN work-up positive for ANA, anti-dsDNA antibody P-ANCA and antihistone antibody.  Anti-GBM, C3, C4 negative.  Protein electrophoresis with no M spike.  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  Antihistone is positive.  We discontinued hydralazine immediately and started empiric prednisone on 6/14.  The patient underwent native kidney biopsy by IR on 6/15.  I will continue prednisone and she will possibly need immunosuppression like rituximab versus Cytoxan pending the biopsy results.  I reviewed this plan with the patient today and she agreed with it.  Hepatitis panel negative.  Fortunately, she is nonoliguric and creatinine level is downtrending.  Continue to monitor.  Strict ins and out, daily lab.  # Anemia, ? related with vasculitis: Iron saturation 33%, I will order a dose of Aranesp.  No sign of bleeding.  # HTN/volume: Blood pressure elevated.  Increased amlodipine to 10 mg.  Off of hydralazine as discussed above.  No ACE inhibitor or ARB because of AKI.  #Left leg DVT: Currently on heparin.  She has a history of breast cancer.  Please contact on-call nephrologist over the weekend with any questions.  Subjective: Seen and examined at the bedside.  She had kidney biopsy yesterday at [facility].  She continued to feel good without any complaint or concern.  Denies fever, chills,  nausea, vomiting, chest pain, shortness of breath.  The urine output is recorded only 1 L however patient reports incontinence and no accurate measurement.  Denies back pain.  Objective Vital signs in last 24 hours: Vitals:   04/09/22 1312 04/09/22 1616 04/09/22 2000 04/10/22 0438  BP: (!) 158/57 (!) 158/61 (!) 167/59 (!) 159/62  Pulse: 75 66 86 81  Resp:  '20 18 18  '$ Temp: 97.8 F (36.6 C) 98.2 F (36.8 C) 98.8 F (37.1 C) 98.8 F (37.1 C)  TempSrc: Oral Oral Oral Oral  SpO2: 96% 95% 94% 94%  Weight:      Height:       Weight change:   Intake/Output Summary (Last 24 hours) at 04/10/2022 1107 Last data filed at 04/10/2022 0900 Gross per 24 hour  Intake 960 ml  Output 900 ml  Net 60 ml        Labs: RENAL PANEL Recent Labs    04/05/22 1317 04/06/22 0444 04/07/22 0514 04/08/22 0452 04/09/22 0531 04/10/22 0454  NA 139 139 139 136 134* 136  K 4.0 4.1 3.8 3.8 4.1 4.5  CL 106 111 110 107 106 105  CO2 '26 27 24 24 24 24  '$ GLUCOSE 103* 102* 100* 106* 110* 113*  BUN 40* 34* 32* 31* 41* 48*  CREATININE 3.16* 2.99* 2.75* 2.79* 3.14* 2.90*  CALCIUM 9.9 9.2 9.1 9.2 9.5 9.9  PHOS  --   --  3.2 3.0 3.5 4.1  ALBUMIN  --   --  2.7* 2.6* 2.7* 2.9*      Liver Function Tests: Recent Labs  Lab 04/08/22 0452 04/09/22 0531 04/10/22 0454  ALBUMIN 2.6* 2.7* 2.9*  No results for input(s): "LIPASE", "AMYLASE" in the last 168 hours. No results for input(s): "AMMONIA" in the last 168 hours. CBC: Recent Labs    04/06/22 0444 04/07/22 0514 04/08/22 0452 04/09/22 0531 04/10/22 0454  HGB 7.6* 8.1* 8.1* 8.2* 8.7*  MCV 92.4 91.6 90.8 90.2 91.3  FERRITIN  --   --   --  79  --   TIBC  --   --   --  239*  --   IRON  --   --   --  79  --      Cardiac Enzymes: No results for input(s): "CKTOTAL", "CKMB", "CKMBINDEX", "TROPONINI" in the last 168 hours. CBG: No results for input(s): "GLUCAP" in the last 168 hours.  Iron Studies:  Recent Labs    04/09/22 0531  IRON 79  TIBC  239*  FERRITIN 79   Studies/Results: US BIOPSY (KIDNEY)  Result Date: 04/09/2022 CLINICAL DATA:  Renal insufficiency, biopsy requested EXAM: ULTRASOUND AND CT-GUIDED CORE RENAL BIOPSY COMPARISON:  None Available. TECHNIQUE: Survey ultrasound was performed and an appropriate skin entry site was localized. Site was marked, prepped with chlorhexidine, draped in usual sterile fashion, infiltrated locally with 1% lidocaine. Intravenous Fentanyl 45mg and Versed 1.'5mg'$  were administered as conscious sedation during continuous monitoring of the patient's level of consciousness and physiological / cardiorespiratory status by the radiology RN, with a total moderate sedation time of 78 minutes. Under real time ultrasound guidance, a 15 gauge trocar needle was advanced to the margin of the lower pole of the left kidney for 2 coaxial 16 gauge core biopsy needle passes. The first was mainly fat. The second was small. Subsequently, there was limited visualization precluding additional safe core biopsies with ultrasound. For this reason, patient was transferred to CT, placed prone, and limited axial scans obtained to localize the lower pole left kidney again. There was only minimal regional hemorrhage. Subsequently, after the region was prepped and draped and infiltrated with 1% lidocaine, the 15 gauge needle was advanced to the margin of the lower pole left kidney again and additional core samples were obtained which appeared grossly adequate. Postprocedure scan shows no significant hemorrhage or other apparent complication. The patient tolerated procedure well. COMPLICATIONS: None. IMPRESSION: 1. Technically successful ultrasound- and CT-guided core renal biopsy , left lower pole. Electronically Signed   By: DLucrezia EuropeM.D.   On: 04/09/2022 15:20   CT RENAL BIOPSY  Result Date: 04/09/2022 CLINICAL DATA:  Renal insufficiency, biopsy requested EXAM: ULTRASOUND AND CT-GUIDED CORE RENAL BIOPSY COMPARISON:  None Available.  TECHNIQUE: Survey ultrasound was performed and an appropriate skin entry site was localized. Site was marked, prepped with chlorhexidine, draped in usual sterile fashion, infiltrated locally with 1% lidocaine. Intravenous Fentanyl 778m and Versed 1.'5mg'$  were administered as conscious sedation during continuous monitoring of the patient's level of consciousness and physiological / cardiorespiratory status by the radiology RN, with a total moderate sedation time of 78 minutes. Under real time ultrasound guidance, a 15 gauge trocar needle was advanced to the margin of the lower pole of the left kidney for 2 coaxial 16 gauge core biopsy needle passes. The first was mainly fat. The second was small. Subsequently, there was limited visualization precluding additional safe core biopsies with ultrasound. For this reason, patient was transferred to CT, placed prone, and limited axial scans obtained to localize the lower pole left kidney again. There was only minimal regional hemorrhage. Subsequently, after the region was prepped and draped and infiltrated with 1% lidocaine, the 15  gauge needle was advanced to the margin of the lower pole left kidney again and additional core samples were obtained which appeared grossly adequate. Postprocedure scan shows no significant hemorrhage or other apparent complication. The patient tolerated procedure well. COMPLICATIONS: None. IMPRESSION: 1. Technically successful ultrasound- and CT-guided core renal biopsy , left lower pole. Electronically Signed   By: Lucrezia Europe M.D.   On: 04/09/2022 15:20    Medications: Infusions:  heparin 1,100 Units/hr (04/10/22 1104)    Scheduled Medications:  amLODipine  10 mg Oral Daily   anastrozole  1 mg Oral Daily   brimonidine  1 drop Both Eyes BID   And   timolol  1 drop Both Eyes BID   pantoprazole  40 mg Oral Daily   predniSONE  60 mg Oral Daily    have reviewed scheduled and prn medications.  Physical Exam: General: Lying on bed  comfortable, not in distress Heart:RRR, s1s2 nl Lungs: Clear b/l, no crackle Abdomen:soft, Non-tender, non-distended Extremities: Trace peripheral edema present. Neurology: Alert, awake and following commands.  Jodilyn Giese Tanna Furry 04/10/2022,11:07 AM  LOS: 4 days

## 2022-04-10 NOTE — Plan of Care (Signed)
  Problem: Acute Rehab PT Goals(only PT should resolve) Goal: Pt Will Go Supine/Side To Sit Outcome: Progressing Flowsheets (Taken 04/10/2022 1514) Pt will go Supine/Side to Sit: Independently Goal: Patient Will Transfer Sit To/From Stand Outcome: Progressing Flowsheets (Taken 04/10/2022 1514) Patient will transfer sit to/from stand: Independently Goal: Pt Will Transfer Bed To Chair/Chair To Bed Outcome: Progressing Flowsheets (Taken 04/10/2022 1514) Pt will Transfer Bed to Chair/Chair to Bed:  with modified independence  Independently Goal: Pt Will Ambulate Outcome: Progressing Flowsheets (Taken 04/10/2022 1514) Pt will Ambulate:  > 125 feet  with modified independence  with rolling walker   3:14 PM, 04/10/22 Lonell Grandchild, MPT Physical Therapist with [facility] [phone] office [phone] mobile phone

## 2022-04-10 NOTE — Hospital Course (Signed)
As per H&P written by Dr. Denton Brick on 04/05/2022 [name] [name] is a 81 y.o. female with medical history significant for recurrent breast cancer, Fe deficiency anemia, hyperparathyroidism, hypertension. Patient presented to the ED with intermittent difficulty breathing over the past 2 weeks.  Symptoms are mostly with exertion.  She denies cough.  No chest pain.  She reports swelling of her lower extremities today.  She feels her abdomen is bloated.  Checks her weight regularly, she reports there has been no change. She denies vomiting, no loose stools.  She has maintained good oral intake.  Not on any diuretics.   ED Course:  Temp 97.6.  Heart rate 70s to 80s.  Respiratory rate 13-24.  Blood pressure systolic 253G to 644I. Creatinine elevated at 3.16, BNP 73.  Chest x-ray without acute abnormality. 1 L bolus given.  Renal ultrasound ordered which did not show hydronephrosis.  Her renal function continued to worsen.  Nephrology was consulted.  Auto-antibodies were ordered and showed positive ANA, anti-dsDNA, anti-histone, P-ANCA.  She was started on empiric prednisone.  Renal biopsy was peformed ont 6/15.  She was found to have LLE DVT for which she was started on IV heparin.  This was held for her renal biopsy and restarted 12 hours after.

## 2022-04-11 DIAGNOSIS — N179 Acute kidney failure, unspecified: Secondary | ICD-10-CM | POA: Diagnosis not present

## 2022-04-11 DIAGNOSIS — I82402 Acute embolism and thrombosis of unspecified deep veins of left lower extremity: Secondary | ICD-10-CM | POA: Diagnosis not present

## 2022-04-11 DIAGNOSIS — N009 Acute nephritic syndrome with unspecified morphologic changes: Secondary | ICD-10-CM | POA: Diagnosis not present

## 2022-04-11 LAB — RENAL FUNCTION PANEL
Albumin: 2.6 g/dL — ABNORMAL LOW (ref 3.5–5.0)
Anion gap: 6 (ref 5–15)
BUN: 57 mg/dL — ABNORMAL HIGH (ref 8–23)
CO2: 24 mmol/L (ref 22–32)
Calcium: 9.8 mg/dL (ref 8.9–10.3)
Chloride: 105 mmol/L (ref 98–111)
Creatinine, Ser: 2.86 mg/dL — ABNORMAL HIGH (ref 0.44–1.00)
GFR, Estimated: 16 mL/min — ABNORMAL LOW (ref >60)
Glucose, Bld: 102 mg/dL — ABNORMAL HIGH (ref 70–99)
Phosphorus: 3.6 mg/dL (ref 2.5–4.6)
Potassium: 4.7 mmol/L (ref 3.5–5.1)
Sodium: 135 mmol/L (ref 135–145)

## 2022-04-11 LAB — CBC
HCT: 24.4 % — ABNORMAL LOW (ref 36.0–46.0)
Hemoglobin: 7.8 g/dL — ABNORMAL LOW (ref 12.0–15.0)
MCH: 28.8 pg (ref 26.0–34.0)
MCHC: 32 g/dL (ref 30.0–36.0)
MCV: 90 fL (ref 80.0–100.0)
Platelets: 340 10*3/uL (ref 150–400)
RBC: 2.71 MIL/uL — ABNORMAL LOW (ref 3.87–5.11)
RDW: 14.6 % (ref 11.5–15.5)
WBC: 9.8 10*3/uL (ref 4.0–10.5)
nRBC: 0 % (ref 0.0–0.2)

## 2022-04-11 LAB — HEPARIN LEVEL (UNFRACTIONATED): Heparin Unfractionated: 0.6 IU/mL (ref 0.30–0.70)

## 2022-04-11 MED ORDER — METOPROLOL TARTRATE 25 MG PO TABS
12.5000 mg | ORAL_TABLET | Freq: Two times a day (BID) | ORAL | Status: DC
  Administered 2022-04-11 – 2022-04-13 (×4): 12.5 mg via ORAL
  Filled 2022-04-11 (×4): qty 1

## 2022-04-11 NOTE — Assessment & Plan Note (Signed)
Work up as above Marketing executive follow up with [employer] on 6/26

## 2022-04-11 NOTE — Progress Notes (Signed)
PROGRESS NOTE  [name] [name] GEX:[id number] DOB: 1941/03/01 DOA: 04/05/2022 PCP: Monico Blitz, MD  Brief History:  As per H&P written by Dr. Denton Brick on 04/05/2022 [name] [name] is a 81 y.o. female with medical history significant for recurrent breast cancer, Fe deficiency anemia, hyperparathyroidism, hypertension. Patient presented to the ED with intermittent difficulty breathing over the past 2 weeks.  Symptoms are mostly with exertion.  She denies cough.  No chest pain.  She reports swelling of her lower extremities today.  She feels her abdomen is bloated.  Checks her weight regularly, she reports there has been no change. She denies vomiting, no loose stools.  She has maintained good oral intake.  Not on any diuretics.   ED Course:  Temp 97.6.  Heart rate 70s to 80s.  Respiratory rate 13-24.  Blood pressure systolic 010U to 725D. Creatinine elevated at 3.16, BNP 73.  Chest x-ray without acute abnormality. 1 L bolus given.  Renal ultrasound ordered which did not show hydronephrosis.  Her renal function continued to worsen.  Nephrology was consulted.  Auto-antibodies were ordered and showed positive ANA, anti-dsDNA, anti-histone, P-ANCA.  She was started on empiric prednisone.  Renal biopsy was peformed ont 6/15.  She was found to have LLE DVT for which she was started on IV heparin.  This was held for her renal biopsy and restarted 12 hours after.     Assessment and Plan: * AKI (acute kidney injury) ([place]) -Cr-3.16, per Care Everywhere last check 4/24 creatinine was 1.9.  -positive autoantibodies suggest hydralazine induced GN -appreciate renal  -ANA and anti-dsDNA positive -PANCA positive -anti-histone antibody positive -SPEP neg M-spike -renal US without hydronephrosis. 04/09/22--planning renal biopsy -antiGBM, C3, C4 neg -empiric prednisone started 6/14 -serum creatinine appears to be reaching plateau   Acute glomerulonephritis Work up as above  Left leg DVT  ([place]) -Positive lower extremity Dopplers for left distal leg DVT -Heparin initiated. -At least 6 months of anticoagulation using Eliquis will be required at discharge -Sedentarism and prior history of breast cancer are risk factors -continue IV heparin>>held for renal biopsy 6/15 -restarted AC 6/16>>monitor CBC  Obesity, Class III, BMI 40-49.9 (morbid obesity) ([place]) -Body mass index is 39.47 kg/m. -Lifestyle modification  Dyspnea -Dyspnea with exertion. -suspect symptomatic anemia -1 unit PRBCs transfused >>dyspnea improved; stable on RA -04/06/22 echo G1DD LV with hyperdynamic function, no wall motion abnormalities, ejection fraction 70 to 75%.  No significant valvular disorder.   -Lower extremity Doppler with positive left leg DVT; therapy with anticoagulation will be initiated.      HTN (hypertension) -Systolic 664Q to 034V at time of admission. -Continue amlodipine -hydralazine discontinued due to concern for drug induced GN -add low dose metoprolol  Malignant neoplasm of upper outer quadrant of female breast ([place]) -Status post lumpectomy, radiation treatment, currently on anastrozole. -Continue patient follow-up with oncology service.  Anemia, iron deficiency -In the setting of anemia chronic kidney disease -No overt bleeding appreciated -Given symptomatic symptoms after discussing with nephrology service 1 unit of PRBCs transfused -IV iron and Epogen therapy as per renal discretion. -Current hemoglobin after transfusion 8.1>>8.7   Family Communication:  no Family at bedside   Consultants:  renal   Code Status:  FULL    DVT Prophylaxis:  IV  Heparin on hold 6/15 for renal bx>>restarted 6/16   Procedures: As Listed in Progress Note Above   Antibiotics: None  Subjective: Patient denies fevers, chills, headache, chest pain, dyspnea, nausea, vomiting, diarrhea, abdominal pain, dysuria, hematuria, hematochezia, and  melena.   Objective: Vitals:   04/10/22 2155 04/10/22 2300 04/11/22 0524 04/11/22 1300  BP: (!) 191/74 (!) 157/63 (!) 156/63 (!) 153/61  Pulse: 87 68 72 67  Resp: '19  19 18  '$ Temp: 97.9 F (36.6 C)  98.6 F (37 C) 98.6 F (37 C)  TempSrc: Oral  Oral Oral  SpO2: 95%  96% 98%  Weight:      Height:        Intake/Output Summary (Last 24 hours) at 04/11/2022 1659 Last data filed at 04/10/2022 2204 Gross per 24 hour  Intake 457.56 ml  Output --  Net 457.56 ml   Weight change:  Exam:  General:  Pt is alert, follows commands appropriately, not in acute distress HEENT: No icterus, No thrush, No neck mass, [place]/AT Cardiovascular: RRR, S1/S2, no rubs, no gallops Respiratory: CTA bilaterally, no wheezing, no crackles, no rhonchi Abdomen: Soft/+BS, non tender, non distended, no guarding Extremities: No edema, No lymphangitis, No petechiae, No rashes, no synovitis   Data Reviewed: I have personally reviewed following labs and imaging studies Basic Metabolic Panel: Recent Labs  Lab 04/07/22 0514 04/08/22 0452 04/09/22 0531 04/10/22 0454 04/11/22 0440  NA 139 136 134* 136 135  K 3.8 3.8 4.1 4.5 4.7  CL 110 107 106 105 105  CO2 '24 24 24 24 24  '$ GLUCOSE 100* 106* 110* 113* 102*  BUN 32* 31* 41* 48* 57*  CREATININE 2.75* 2.79* 3.14* 2.90* 2.86*  CALCIUM 9.1 9.2 9.5 9.9 9.8  PHOS 3.2 3.0 3.5 4.1 3.6   Liver Function Tests: Recent Labs  Lab 04/07/22 0514 04/08/22 0452 04/09/22 0531 04/10/22 0454 04/11/22 0440  ALBUMIN 2.7* 2.6* 2.7* 2.9* 2.6*   No results for input(s): "LIPASE", "AMYLASE" in the last 168 hours. No results for input(s): "AMMONIA" in the last 168 hours. Coagulation Profile: Recent Labs  Lab 04/09/22 0920  INR 1.0   CBC: Recent Labs  Lab 04/07/22 0514 04/08/22 0452 04/09/22 0531 04/10/22 0454 04/11/22 0440  WBC 6.8 6.3 7.0 8.6 9.8  HGB 8.1* 8.1* 8.2* 8.7* 7.8*  HCT 26.2* 25.7* 25.9* 28.2* 24.4*  MCV 91.6 90.8 90.2 91.3 90.0  PLT 272 272 293  326 340   Cardiac Enzymes: No results for input(s): "CKTOTAL", "CKMB", "CKMBINDEX", "TROPONINI" in the last 168 hours. BNP: Invalid input(s): "POCBNP" CBG: No results for input(s): "GLUCAP" in the last 168 hours. HbA1C: No results for input(s): "HGBA1C" in the last 72 hours. Urine analysis:    Component Value Date/Time   COLORURINE STRAW (A) 04/05/2022 1650   APPEARANCEUR CLEAR 04/05/2022 1650   LABSPEC 1.008 04/05/2022 1650   PHURINE 8.0 04/05/2022 1650   GLUCOSEU NEGATIVE 04/05/2022 1650   HGBUR MODERATE (A) 04/05/2022 1650   BILIRUBINUR NEGATIVE 04/05/2022 1650   KETONESUR NEGATIVE 04/05/2022 1650   PROTEINUR 100 (A) 04/05/2022 1650   UROBILINOGEN 0.2 02/03/2010 1200   NITRITE NEGATIVE 04/05/2022 1650   LEUKOCYTESUR NEGATIVE 04/05/2022 1650   Sepsis Labs: '@LABRCNTIP'$ (procalcitonin:4,lacticidven:4) )No results found for this or any previous visit (from the past 240 hour(s)).   Scheduled Meds:  amLODipine  10 mg Oral Daily   anastrozole  1 mg Oral Daily   brimonidine  1 drop Both Eyes BID   And   timolol  1 drop Both Eyes BID   metoprolol tartrate  12.5 mg Oral BID   pantoprazole  40 mg Oral Daily  predniSONE  60 mg Oral Daily   Continuous Infusions:  heparin 1,100 Units/hr (04/11/22 0122)    Procedures/Studies: US BIOPSY (KIDNEY)  Result Date: 04/09/2022 CLINICAL DATA:  Renal insufficiency, biopsy requested EXAM: ULTRASOUND AND CT-GUIDED CORE RENAL BIOPSY COMPARISON:  None Available. TECHNIQUE: Survey ultrasound was performed and an appropriate skin entry site was localized. Site was marked, prepped with chlorhexidine, draped in usual sterile fashion, infiltrated locally with 1% lidocaine. Intravenous Fentanyl 53mg and Versed 1.'5mg'$  were administered as conscious sedation during continuous monitoring of the patient's level of consciousness and physiological / cardiorespiratory status by the radiology RN, with a total moderate sedation time of 78 minutes. Under real  time ultrasound guidance, a 15 gauge trocar needle was advanced to the margin of the lower pole of the left kidney for 2 coaxial 16 gauge core biopsy needle passes. The first was mainly fat. The second was small. Subsequently, there was limited visualization precluding additional safe core biopsies with ultrasound. For this reason, patient was transferred to CT, placed prone, and limited axial scans obtained to localize the lower pole left kidney again. There was only minimal regional hemorrhage. Subsequently, after the region was prepped and draped and infiltrated with 1% lidocaine, the 15 gauge needle was advanced to the margin of the lower pole left kidney again and additional core samples were obtained which appeared grossly adequate. Postprocedure scan shows no significant hemorrhage or other apparent complication. The patient tolerated procedure well. COMPLICATIONS: None. IMPRESSION: 1. Technically successful ultrasound- and CT-guided core renal biopsy , left lower pole. Electronically Signed   By: DLucrezia EuropeM.D.   On: 04/09/2022 15:20   CT RENAL BIOPSY  Result Date: 04/09/2022 CLINICAL DATA:  Renal insufficiency, biopsy requested EXAM: ULTRASOUND AND CT-GUIDED CORE RENAL BIOPSY COMPARISON:  None Available. TECHNIQUE: Survey ultrasound was performed and an appropriate skin entry site was localized. Site was marked, prepped with chlorhexidine, draped in usual sterile fashion, infiltrated locally with 1% lidocaine. Intravenous Fentanyl 799m and Versed 1.'5mg'$  were administered as conscious sedation during continuous monitoring of the patient's level of consciousness and physiological / cardiorespiratory status by the radiology RN, with a total moderate sedation time of 78 minutes. Under real time ultrasound guidance, a 15 gauge trocar needle was advanced to the margin of the lower pole of the left kidney for 2 coaxial 16 gauge core biopsy needle passes. The first was mainly fat. The second was small.  Subsequently, there was limited visualization precluding additional safe core biopsies with ultrasound. For this reason, patient was transferred to CT, placed prone, and limited axial scans obtained to localize the lower pole left kidney again. There was only minimal regional hemorrhage. Subsequently, after the region was prepped and draped and infiltrated with 1% lidocaine, the 15 gauge needle was advanced to the margin of the lower pole left kidney again and additional core samples were obtained which appeared grossly adequate. Postprocedure scan shows no significant hemorrhage or other apparent complication. The patient tolerated procedure well. COMPLICATIONS: None. IMPRESSION: 1. Technically successful ultrasound- and CT-guided core renal biopsy , left lower pole. Electronically Signed   By: D Lucrezia Europe.D.   On: 04/09/2022 15:20   ECHOCARDIOGRAM COMPLETE  Result Date: 04/06/2022    ECHOCARDIOGRAM REPORT   Patient Name:   MAIRIDIANA FONNERUMPER Date of Exam: 04/06/2022 Medical Rec #:  [id number]       Height:       65.0 in Accession #:    [id number]  Weight:       237.2 lb Date of Birth:  [birth year]         BSA:          2.127 m Patient Age:    79 years          BP:           182/72 mmHg Patient Gender: F                 HR:           76 bpm. Exam Location:  [facility] Procedure: 2D Echo, Cardiac Doppler and Color Doppler Indications:    Dyspnea  History:        Patient has no prior history of Echocardiogram examinations.                 Signs/Symptoms:Dyspnea; Risk Factors:Hypertension.  Sonographer:    Wenda Low Referring Phys: [place]  Sonographer Comments: Patient is morbidly obese. IMPRESSIONS  1. Left ventricular ejection fraction, by estimation, is 70 to 75%. Left ventricular ejection fraction by 2D MOD biplane is 71.7 %. The left ventricle has hyperdynamic function. The left ventricle has no regional wall motion abnormalities. There is mild  left ventricular hypertrophy. Left  ventricular diastolic parameters are consistent with Grade I diastolic dysfunction (impaired relaxation).  2. Right ventricular systolic function is normal. The right ventricular size is normal. There is normal pulmonary artery systolic pressure. The estimated right ventricular systolic pressure is 23.5 mmHg.  3. Left atrial size was moderately dilated.  4. The mitral valve is abnormal. Trivial mitral valve regurgitation.  5. The aortic valve is tricuspid. Aortic valve regurgitation is not visualized. Mild aortic valve stenosis. Aortic valve area, by VTI measures 1.72 cm. Aortic valve mean gradient measures 12.0 mmHg. Aortic valve Vmax measures 2.47 m/s.  6. The inferior vena cava is normal in size with greater than 50% respiratory variability, suggesting right atrial pressure of 3 mmHg. Comparison(s): No prior Echocardiogram. FINDINGS  Left Ventricle: Left ventricular ejection fraction, by estimation, is 70 to 75%. Left ventricular ejection fraction by 2D MOD biplane is 71.7 %. The left ventricle has hyperdynamic function. The left ventricle has no regional wall motion abnormalities. The left ventricular internal cavity size was normal in size. There is mild left ventricular hypertrophy. Left ventricular diastolic parameters are consistent with Grade I diastolic dysfunction (impaired relaxation). Indeterminate filling pressures. Right Ventricle: The right ventricular size is normal. No increase in right ventricular wall thickness. Right ventricular systolic function is normal. There is normal pulmonary artery systolic pressure. The tricuspid regurgitant velocity is 2.34 m/s, and  with an assumed right atrial pressure of 3 mmHg, the estimated right ventricular systolic pressure is 57.3 mmHg. Left Atrium: Left atrial size was moderately dilated. Right Atrium: Right atrial size was normal in size. Pericardium: There is no evidence of pericardial effusion. Mitral Valve: The mitral valve is abnormal. There is mild  calcification of the anterior and posterior mitral valve leaflet(s). Trivial mitral valve regurgitation. MV peak gradient, 10.9 mmHg. The mean mitral valve gradient is 3.0 mmHg. Tricuspid Valve: The tricuspid valve is grossly normal. Tricuspid valve regurgitation is trivial. Aortic Valve: The aortic valve is tricuspid. Aortic valve regurgitation is not visualized. Mild aortic stenosis is present. Aortic valve mean gradient measures 12.0 mmHg. Aortic valve peak gradient measures 24.4 mmHg. Aortic valve area, by VTI measures 1.72 cm. Pulmonic Valve: The pulmonic valve was normal in structure. Pulmonic valve regurgitation is not visualized. Aorta:  The aortic root and ascending aorta are structurally normal, with no evidence of dilitation. Venous: The inferior vena cava is normal in size with greater than 50% respiratory variability, suggesting right atrial pressure of 3 mmHg. IAS/Shunts: No atrial level shunt detected by color flow Doppler.  LEFT VENTRICLE PLAX 2D                        Biplane EF (MOD) LVIDd:         4.80 cm         LV Biplane EF:   Left LVIDs:         2.80 cm                          ventricular LV PW:         1.30 cm                          ejection LV IVS:        1.30 cm                          fraction by LVOT diam:     2.00 cm                          2D MOD LV SV:         89                               biplane is LV SV Index:   42                               71.7 %. LVOT Area:     3.14 cm                                Diastology                                LV e' medial:    6.85 cm/s LV Volumes (MOD)               LV E/e' medial:  11.6 LV vol d, MOD    74.7 ml       LV e' lateral:   11.20 cm/s A2C:                           LV E/e' lateral: 7.1 LV vol d, MOD    70.5 ml A4C: LV vol s, MOD    20.1 ml A2C: LV vol s, MOD    21.5 ml A4C: LV SV MOD A2C:   54.6 ml LV SV MOD A4C:   70.5 ml LV SV MOD BP:    52.6 ml RIGHT VENTRICLE RV Basal diam:  3.35 cm RV Mid diam:    3.00 cm RV S prime:      14.90 cm/s TAPSE (M-mode): 3.3 cm LEFT ATRIUM             Index        RIGHT ATRIUM  Index LA diam:        4.10 cm 1.93 cm/m   RA Area:     18.90 cm LA Vol (A2C):   62.0 ml 29.15 ml/m  RA Volume:   53.10 ml  24.97 ml/m LA Vol (A4C):   81.6 ml 38.37 ml/m LA Biplane Vol: 76.8 ml 36.11 ml/m  AORTIC VALVE                     PULMONIC VALVE AV Area (Vmax):    1.97 cm      PV Vmax:       1.15 m/s AV Area (Vmean):   1.78 cm      PV Peak grad:  5.3 mmHg AV Area (VTI):     1.72 cm AV Vmax:           247.00 cm/s AV Vmean:          159.500 cm/s AV VTI:            0.516 m AV Peak Grad:      24.4 mmHg AV Mean Grad:      12.0 mmHg LVOT Vmax:         155.00 cm/s LVOT Vmean:        90.400 cm/s LVOT VTI:          0.283 m LVOT/AV VTI ratio: 0.55  AORTA Ao Root diam: 2.80 cm MITRAL VALVE                TRICUSPID VALVE MV Area (PHT): 3.21 cm     TR Peak grad:   21.9 mmHg MV Area VTI:   1.99 cm     TR Vmax:        234.00 cm/s MV Peak grad:  10.9 mmHg MV Mean grad:  3.0 mmHg     SHUNTS MV Vmax:       1.65 m/s     Systemic VTI:  0.28 m MV Vmean:      83.0 cm/s    Systemic Diam: 2.00 cm MV Decel Time: 236 msec MV E velocity: 79.50 cm/s MV A velocity: 141.00 cm/s MV E/A ratio:  0.56 Lyman Bishop MD Electronically signed by Lyman Bishop MD Signature Date/Time: 04/06/2022/6:10:02 PM    Final    US Venous Img Lower Bilateral (DVT)  Result Date: 04/06/2022 CLINICAL DATA:  81 year old female with dyspnea for 2 weeks and swelling of the lower extremity EXAM: BILATERAL LOWER EXTREMITY VENOUS DOPPLER ULTRASOUND TECHNIQUE: Gray-scale sonography with graded compression, as well as color Doppler and duplex ultrasound were performed to evaluate the lower extremity deep venous systems from the level of the common femoral vein and including the common femoral, femoral, profunda femoral, popliteal and calf veins including the posterior tibial, peroneal and gastrocnemius veins when visible. The superficial great saphenous vein was  also interrogated. Spectral Doppler was utilized to evaluate flow at rest and with distal augmentation maneuvers in the common femoral, femoral and popliteal veins. COMPARISON:  None Available. FINDINGS: RIGHT LOWER EXTREMITY Common Femoral Vein: No evidence of thrombus. Normal compressibility, respiratory phasicity and response to augmentation. Saphenofemoral Junction: No evidence of thrombus. Normal compressibility and flow on color Doppler imaging. Profunda Femoral Vein: No evidence of thrombus. Normal compressibility and flow on color Doppler imaging. Femoral Vein: No evidence of thrombus. Normal compressibility, respiratory phasicity and response to augmentation. Popliteal Vein: No evidence of thrombus. Normal compressibility, respiratory phasicity and response to augmentation. Calf Veins: No evidence of thrombus. Normal compressibility and flow  on color Doppler imaging. Superficial Great Saphenous Vein: No evidence of thrombus. Normal compressibility and flow on color Doppler imaging Other Findings:  None. LEFT LOWER EXTREMITY Common Femoral Vein: No evidence of thrombus. Normal compressibility, respiratory phasicity and response to augmentation. Saphenofemoral Junction: No evidence of thrombus. Normal compressibility and flow on color Doppler imaging. Profunda Femoral Vein: No evidence of thrombus. Normal compressibility and flow on color Doppler imaging. Femoral Vein: No evidence of thrombus. Normal compressibility, respiratory phasicity and response to augmentation. Popliteal Vein: No evidence of thrombus. Normal compressibility, respiratory phasicity and response to augmentation. Calf Veins: Occlusive thrombus of the posterior tibial vein, noncompressible. Peroneal vein patent and compressible with flow maintained. Anterior tibial vein patent. Superficial Great Saphenous Vein: No evidence of thrombus. Normal compressibility and flow on color Doppler imaging. Other Findings:  None. IMPRESSION: Directed  duplex left lower extremity positive for distal DVT of the posterior tibial vein. Negative for proximal left DVT. Directed duplex right lower extremity negative for DVT. These results will be called to the ordering clinician or representative by the Radiologist Assistant, and communication documented in the PACS or [employer]. Signed, Dulcy Fanny. Nadene Rubins, RPVI [facility] Electronically Signed   By: Corrie Mckusick D.O.   On: 04/06/2022 12:00   US Renal  Result Date: 04/06/2022 CLINICAL DATA:  Acute renal injury EXAM: RENAL / URINARY TRACT ULTRASOUND COMPLETE COMPARISON:  None Available. FINDINGS: Right Kidney: Renal measurements: 10.7 x 5.1 x 4.9 cm = volume: 167 mL. Two small (1.4 cm) anechoic cysts. No hydronephrosis Left Kidney: Renal measurements: 11.4 x 7.1 x 5.6 cm = volume: 237 mL. Single small 2.3 cm anechoic cyst. No hydronephrosis. Bladder: Appears normal for degree of bladder distention. Other: None. IMPRESSION: 1. No hydronephrosis. 2. Bilateral benign-appearing renal cysts. 3. Normal bladder. Electronically Signed   By: Suzy Bouchard M.D.   On: 04/06/2022 10:39   DG Chest 2 View  Result Date: 04/05/2022 CLINICAL DATA:  Pt c/o sob and weakness for several days. Denies cp, fever or n/v. HX of hypertension and CA. EXAM: CHEST - 2 VIEW COMPARISON:  01/08/2020. FINDINGS: Mild enlargement of the cardiopericardial silhouette. No mediastinal or hilar masses. No evidence of adenopathy. Clear lungs.  No pleural effusion or pneumothorax. Skeletal structures are intact. IMPRESSION: No acute cardiopulmonary disease. Electronically Signed   By: Lajean Manes M.D.   On: 04/05/2022 14:04    Orson Eva, DO  [facility]  If 7PM-7AM, please contact night-coverage [email or web address] Password [email or web address] 04/11/2022, 4:59 PM   LOS: 5 days

## 2022-04-11 NOTE — Progress Notes (Signed)
ANTICOAGULATION CONSULT NOTE -  Pharmacy Consult for Heparin Indication:  LLE DVT 04/06/22  Allergies  Allergen Reactions   Ace Inhibitors Swelling   Sulfa Antibiotics Swelling and Rash   Patient Measurements: Height: '5\' 5"'$  (165.1 cm) Weight: 107.6 kg (237 lb 3.4 oz) IBW/kg (Calculated) : 57 Heparin Dosing Weight: 82.2 kg  Labs: Recent Labs    04/09/22 0531 04/09/22 0920 04/10/22 0454 04/10/22 0904 04/10/22 1851 04/11/22 0440  HGB 8.2*  --  8.7*  --   --  7.8*  HCT 25.9*  --  28.2*  --   --  24.4*  PLT 293  --  326  --   --  340  LABPROT  --  12.9  --   --   --   --   INR  --  1.0  --   --   --   --   HEPARINUNFRC 0.62  --   --  0.27* 0.55 0.60  CREATININE 3.14*  --  2.90*  --   --   --     Estimated Creatinine Clearance: 19.5 mL/min (A) (by C-G formula based on SCr of 2.9 mg/dL (H)).  Assessment: 81 year old W  found to have LLE DVT 04/06/22. PMH significant for anemia, breast cancer and obesity. No anticoagulation prior to admission. Pharmacy consulted for heparin.    Heparin level therapeutic. H&H worse, PLTs stable, no bleeding reported. (See above labs)  Goal of Therapy:  Heparin level 0.3-0.7 units/ml Monitor platelets by anticoagulation protocol: Yes   Plan:  Continue heparin infusion at 1100 units/hr Monitor daily heparin level, CBC Monitor for signs/symptoms of bleeding   F/u longterm anticoagulation plan   Hart Robinsons, PharmD Clinical Pharmacist 04/11/2022 7:49 AM

## 2022-04-12 DIAGNOSIS — N009 Acute nephritic syndrome with unspecified morphologic changes: Secondary | ICD-10-CM | POA: Diagnosis not present

## 2022-04-12 DIAGNOSIS — I82402 Acute embolism and thrombosis of unspecified deep veins of left lower extremity: Secondary | ICD-10-CM | POA: Diagnosis not present

## 2022-04-12 DIAGNOSIS — N179 Acute kidney failure, unspecified: Secondary | ICD-10-CM | POA: Diagnosis not present

## 2022-04-12 LAB — RENAL FUNCTION PANEL
Albumin: 2.7 g/dL — ABNORMAL LOW (ref 3.5–5.0)
Anion gap: 6 (ref 5–15)
BUN: 59 mg/dL — ABNORMAL HIGH (ref 8–23)
CO2: 25 mmol/L (ref 22–32)
Calcium: 9.7 mg/dL (ref 8.9–10.3)
Chloride: 105 mmol/L (ref 98–111)
Creatinine, Ser: 2.48 mg/dL — ABNORMAL HIGH (ref 0.44–1.00)
GFR, Estimated: 19 mL/min — ABNORMAL LOW (ref >60)
Glucose, Bld: 93 mg/dL (ref 70–99)
Phosphorus: 3.2 mg/dL (ref 2.5–4.6)
Potassium: 4.6 mmol/L (ref 3.5–5.1)
Sodium: 136 mmol/L (ref 135–145)

## 2022-04-12 LAB — HEPARIN LEVEL (UNFRACTIONATED): Heparin Unfractionated: 0.63 IU/mL (ref 0.30–0.70)

## 2022-04-12 LAB — CBC
HCT: 26 % — ABNORMAL LOW (ref 36.0–46.0)
Hemoglobin: 8.4 g/dL — ABNORMAL LOW (ref 12.0–15.0)
MCH: 29 pg (ref 26.0–34.0)
MCHC: 32.3 g/dL (ref 30.0–36.0)
MCV: 89.7 fL (ref 80.0–100.0)
Platelets: 362 10*3/uL (ref 150–400)
RBC: 2.9 MIL/uL — ABNORMAL LOW (ref 3.87–5.11)
RDW: 14.6 % (ref 11.5–15.5)
WBC: 11.5 10*3/uL — ABNORMAL HIGH (ref 4.0–10.5)
nRBC: 0 % (ref 0.0–0.2)

## 2022-04-12 NOTE — TOC Progression Note (Addendum)
Transition of Care [facility]) - Progression Note    Patient Details  Name: [name] [name] MRN: [id number] Date of Birth: 12-17-1940  Transition of Care [facility] CM/SW [place], LCSW Phone Number: 04/12/2022, 10:47 AM  Clinical Narrative:    Csw spoke with pt about [facility] services, pt stated she has never had [place] before and dose not have a preferred agency. CSW will reach out to [facility] providers and see who can accept for [facility] PT.  12:30pm CSW reached out to [place] with [facility] to inquire about [facility] services . Pt was accepted for [facility] PT .    Barriers to Discharge: Continued Medical Work up  Expected Discharge Plan and Services           Expected Discharge Date: 04/09/22                                     Social Determinants of Health (SDOH) Interventions    Readmission Risk Interventions     No data to display

## 2022-04-12 NOTE — Progress Notes (Signed)
PROGRESS NOTE  [name] [name] HWE:[id number] DOB: 1941/02/08 DOA: 04/05/2022 PCP: Monico Blitz, MD  Brief History:  As per H&P written by Dr. Denton Brick on 04/05/2022 [name] [name] is a 81 y.o. female with medical history significant for recurrent breast cancer, Fe deficiency anemia, hyperparathyroidism, hypertension. Patient presented to the ED with intermittent difficulty breathing over the past 2 weeks.  Symptoms are mostly with exertion.  She denies cough.  No chest pain.  She reports swelling of her lower extremities today.  She feels her abdomen is bloated.  Checks her weight regularly, she reports there has been no change. She denies vomiting, no loose stools.  She has maintained good oral intake.  Not on any diuretics.   ED Course:  Temp 97.6.  Heart rate 70s to 80s.  Respiratory rate 13-24.  Blood pressure systolic 893Y to 101B. Creatinine elevated at 3.16, BNP 73.  Chest x-ray without acute abnormality. 1 L bolus given.  Renal ultrasound ordered which did not show hydronephrosis.  Her renal function continued to worsen.  Nephrology was consulted.  Auto-antibodies were ordered and showed positive ANA, anti-dsDNA, anti-histone, P-ANCA.  She was started on empiric prednisone.  Renal biopsy was peformed ont 6/15.  She was found to have LLE DVT for which she was started on IV heparin.  This was held for her renal biopsy and restarted 12 hours after.      Assessment and Plan: * AKI (acute kidney injury) ([place]) -Cr-3.16, per Care Everywhere last check 4/24 creatinine was 1.9.  -positive autoantibodies suggest hydralazine induced GN -appreciate renal  -ANA and anti-dsDNA positive -PANCA positive -anti-histone antibody positive -SPEP neg M-spike -renal US without hydronephrosis. 04/09/22--planning renal biopsy -antiGBM, C3, C4 neg -empiric prednisone started 6/14 -serum creatinine gradually improving   Acute glomerulonephritis Work up as above  Left leg DVT  ([place]) -Positive lower extremity Dopplers for left distal leg DVT -Heparin initiated. -At least 6 months of anticoagulation using Eliquis will be required at discharge -vasculitis and prior history of breast cancer are risk factors -continue IV heparin>>held for renal biopsy 6/15 -restarted AC 6/16>>monitor CBC  Obesity, Class III, BMI 40-49.9 (morbid obesity) ([place]) -Body mass index is 39.47 kg/m. -Lifestyle modification  Dyspnea -Dyspnea with exertion. -suspect symptomatic anemia -1 unit PRBCs transfused >>dyspnea improved; stable on RA -04/06/22 echo G1DD LV with hyperdynamic function, no wall motion abnormalities, ejection fraction 70 to 75%.  No significant valvular disorder.   -Lower extremity Doppler with positive left leg DVT; therapy with anticoagulation will be initiated.      HTN (hypertension) -Systolic 510C to 585I at time of admission. -Continue amlodipine -hydralazine discontinued due to concern for drug induced GN -added low dose metoprolol--titration limited by bradycardia  Malignant neoplasm of upper outer quadrant of female breast ([place]) -Status post lumpectomy, radiation treatment, currently on anastrozole. -Continue patient follow-up with oncology service.  Anemia, iron deficiency -In the setting of anemia chronic kidney disease -No overt bleeding appreciated -Given symptomatic symptoms after discussing with nephrology service 1 unit of PRBCs transfused -IV iron and Epogen therapy as per renal discretion. -Current hemoglobin after transfusion 8.1>>8.7>>7.8>>8.4           Family Communication:   granddaughter updated at bedside 6/18  Consultants:  renal  Code Status:  FULL  DVT Prophylaxis:  IV Heparin    Procedures: As Listed in Progress Note Above  Antibiotics: None      Subjective: Patient denies fevers, chills, headache,  chest pain, dyspnea, nausea, vomiting, diarrhea, abdominal pain, dysuria, hematuria, hematochezia, and  melena.   Objective: Vitals:   04/12/22 0633 04/12/22 0755 04/12/22 1059 04/12/22 1335  BP: (!) 177/64 (!) 181/64 (!) 153/57 (!) 148/55  Pulse: 67 70 (!) 53 (!) 59  Resp:    18  Temp: 98.2 F (36.8 C)   98.3 F (36.8 C)  TempSrc: Oral   Oral  SpO2: 96% 96%  96%  Weight:      Height:        Intake/Output Summary (Last 24 hours) at 04/12/2022 1634 Last data filed at 04/12/2022 1300 Gross per 24 hour  Intake 1133.17 ml  Output 1200 ml  Net -66.83 ml   Weight change:  Exam:  General:  Pt is alert, follows commands appropriately, not in acute distress HEENT: No icterus, No thrush, No neck mass, [place]/AT Cardiovascular: RRR, S1/S2, no rubs, no gallops Respiratory: CTA bilaterally, no wheezing, no crackles, no rhonchi Abdomen: Soft/+BS, non tender, non distended, no guarding Extremities: trace LE edema, No lymphangitis, No petechiae, No rashes, no synovitis   Data Reviewed: I have personally reviewed following labs and imaging studies Basic Metabolic Panel: Recent Labs  Lab 04/08/22 0452 04/09/22 0531 04/10/22 0454 04/11/22 0440 04/12/22 0544  NA 136 134* 136 135 136  K 3.8 4.1 4.5 4.7 4.6  CL 107 106 105 105 105  CO2 '24 24 24 24 25  '$ GLUCOSE 106* 110* 113* 102* 93  BUN 31* 41* 48* 57* 59*  CREATININE 2.79* 3.14* 2.90* 2.86* 2.48*  CALCIUM 9.2 9.5 9.9 9.8 9.7  PHOS 3.0 3.5 4.1 3.6 3.2   Liver Function Tests: Recent Labs  Lab 04/08/22 0452 04/09/22 0531 04/10/22 0454 04/11/22 0440 04/12/22 0544  ALBUMIN 2.6* 2.7* 2.9* 2.6* 2.7*   No results for input(s): "LIPASE", "AMYLASE" in the last 168 hours. No results for input(s): "AMMONIA" in the last 168 hours. Coagulation Profile: Recent Labs  Lab 04/09/22 0920  INR 1.0   CBC: Recent Labs  Lab 04/08/22 0452 04/09/22 0531 04/10/22 0454 04/11/22 0440 04/12/22 0544  WBC 6.3 7.0 8.6 9.8 11.5*  HGB 8.1* 8.2* 8.7* 7.8* 8.4*  HCT 25.7* 25.9* 28.2* 24.4* 26.0*  MCV 90.8 90.2 91.3 90.0 89.7  PLT 272 293 326 340  362   Cardiac Enzymes: No results for input(s): "CKTOTAL", "CKMB", "CKMBINDEX", "TROPONINI" in the last 168 hours. BNP: Invalid input(s): "POCBNP" CBG: No results for input(s): "GLUCAP" in the last 168 hours. HbA1C: No results for input(s): "HGBA1C" in the last 72 hours. Urine analysis:    Component Value Date/Time   COLORURINE STRAW (A) 04/05/2022 1650   APPEARANCEUR CLEAR 04/05/2022 1650   LABSPEC 1.008 04/05/2022 1650   PHURINE 8.0 04/05/2022 1650   GLUCOSEU NEGATIVE 04/05/2022 1650   HGBUR MODERATE (A) 04/05/2022 1650   BILIRUBINUR NEGATIVE 04/05/2022 1650   KETONESUR NEGATIVE 04/05/2022 1650   PROTEINUR 100 (A) 04/05/2022 1650   UROBILINOGEN 0.2 02/03/2010 1200   NITRITE NEGATIVE 04/05/2022 1650   LEUKOCYTESUR NEGATIVE 04/05/2022 1650   Sepsis Labs: '@LABRCNTIP'$ (procalcitonin:4,lacticidven:4) )No results found for this or any previous visit (from the past 240 hour(s)).   Scheduled Meds:  amLODipine  10 mg Oral Daily   anastrozole  1 mg Oral Daily   brimonidine  1 drop Both Eyes BID   And   timolol  1 drop Both Eyes BID   metoprolol tartrate  12.5 mg Oral BID   pantoprazole  40 mg Oral Daily   predniSONE  60 mg Oral  Daily   Continuous Infusions:  heparin 1,100 Units/hr (04/12/22 1152)    Procedures/Studies: US BIOPSY (KIDNEY)  Result Date: 04/09/2022 CLINICAL DATA:  Renal insufficiency, biopsy requested EXAM: ULTRASOUND AND CT-GUIDED CORE RENAL BIOPSY COMPARISON:  None Available. TECHNIQUE: Survey ultrasound was performed and an appropriate skin entry site was localized. Site was marked, prepped with chlorhexidine, draped in usual sterile fashion, infiltrated locally with 1% lidocaine. Intravenous Fentanyl 26mg and Versed 1.'5mg'$  were administered as conscious sedation during continuous monitoring of the patient's level of consciousness and physiological / cardiorespiratory status by the radiology RN, with a total moderate sedation time of 78 minutes. Under real time  ultrasound guidance, a 15 gauge trocar needle was advanced to the margin of the lower pole of the left kidney for 2 coaxial 16 gauge core biopsy needle passes. The first was mainly fat. The second was small. Subsequently, there was limited visualization precluding additional safe core biopsies with ultrasound. For this reason, patient was transferred to CT, placed prone, and limited axial scans obtained to localize the lower pole left kidney again. There was only minimal regional hemorrhage. Subsequently, after the region was prepped and draped and infiltrated with 1% lidocaine, the 15 gauge needle was advanced to the margin of the lower pole left kidney again and additional core samples were obtained which appeared grossly adequate. Postprocedure scan shows no significant hemorrhage or other apparent complication. The patient tolerated procedure well. COMPLICATIONS: None. IMPRESSION: 1. Technically successful ultrasound- and CT-guided core renal biopsy , left lower pole. Electronically Signed   By: DLucrezia EuropeM.D.   On: 04/09/2022 15:20   CT RENAL BIOPSY  Result Date: 04/09/2022 CLINICAL DATA:  Renal insufficiency, biopsy requested EXAM: ULTRASOUND AND CT-GUIDED CORE RENAL BIOPSY COMPARISON:  None Available. TECHNIQUE: Survey ultrasound was performed and an appropriate skin entry site was localized. Site was marked, prepped with chlorhexidine, draped in usual sterile fashion, infiltrated locally with 1% lidocaine. Intravenous Fentanyl 775m and Versed 1.'5mg'$  were administered as conscious sedation during continuous monitoring of the patient's level of consciousness and physiological / cardiorespiratory status by the radiology RN, with a total moderate sedation time of 78 minutes. Under real time ultrasound guidance, a 15 gauge trocar needle was advanced to the margin of the lower pole of the left kidney for 2 coaxial 16 gauge core biopsy needle passes. The first was mainly fat. The second was small.  Subsequently, there was limited visualization precluding additional safe core biopsies with ultrasound. For this reason, patient was transferred to CT, placed prone, and limited axial scans obtained to localize the lower pole left kidney again. There was only minimal regional hemorrhage. Subsequently, after the region was prepped and draped and infiltrated with 1% lidocaine, the 15 gauge needle was advanced to the margin of the lower pole left kidney again and additional core samples were obtained which appeared grossly adequate. Postprocedure scan shows no significant hemorrhage or other apparent complication. The patient tolerated procedure well. COMPLICATIONS: None. IMPRESSION: 1. Technically successful ultrasound- and CT-guided core renal biopsy , left lower pole. Electronically Signed   By: D Lucrezia Europe.D.   On: 04/09/2022 15:20   ECHOCARDIOGRAM COMPLETE  Result Date: 04/06/2022    ECHOCARDIOGRAM REPORT   Patient Name:   MALAMANDA RUDDERUMPER Date of Exam: 04/06/2022 Medical Rec #:  [id number]       Height:       65.0 in Accession #:    [id number]      Weight:  237.2 lb Date of Birth:  Sep 03, 1941         BSA:          2.127 m Patient Age:    60 years          BP:           182/72 mmHg Patient Gender: F                 HR:           76 bpm. Exam Location:  [facility] Procedure: 2D Echo, Cardiac Doppler and Color Doppler Indications:    Dyspnea  History:        Patient has no prior history of Echocardiogram examinations.                 Signs/Symptoms:Dyspnea; Risk Factors:Hypertension.  Sonographer:    Wenda Low Referring Phys: [place]  Sonographer Comments: Patient is morbidly obese. IMPRESSIONS  1. Left ventricular ejection fraction, by estimation, is 70 to 75%. Left ventricular ejection fraction by 2D MOD biplane is 71.7 %. The left ventricle has hyperdynamic function. The left ventricle has no regional wall motion abnormalities. There is mild  left ventricular hypertrophy. Left  ventricular diastolic parameters are consistent with Grade I diastolic dysfunction (impaired relaxation).  2. Right ventricular systolic function is normal. The right ventricular size is normal. There is normal pulmonary artery systolic pressure. The estimated right ventricular systolic pressure is 07.6 mmHg.  3. Left atrial size was moderately dilated.  4. The mitral valve is abnormal. Trivial mitral valve regurgitation.  5. The aortic valve is tricuspid. Aortic valve regurgitation is not visualized. Mild aortic valve stenosis. Aortic valve area, by VTI measures 1.72 cm. Aortic valve mean gradient measures 12.0 mmHg. Aortic valve Vmax measures 2.47 m/s.  6. The inferior vena cava is normal in size with greater than 50% respiratory variability, suggesting right atrial pressure of 3 mmHg. Comparison(s): No prior Echocardiogram. FINDINGS  Left Ventricle: Left ventricular ejection fraction, by estimation, is 70 to 75%. Left ventricular ejection fraction by 2D MOD biplane is 71.7 %. The left ventricle has hyperdynamic function. The left ventricle has no regional wall motion abnormalities. The left ventricular internal cavity size was normal in size. There is mild left ventricular hypertrophy. Left ventricular diastolic parameters are consistent with Grade I diastolic dysfunction (impaired relaxation). Indeterminate filling pressures. Right Ventricle: The right ventricular size is normal. No increase in right ventricular wall thickness. Right ventricular systolic function is normal. There is normal pulmonary artery systolic pressure. The tricuspid regurgitant velocity is 2.34 m/s, and  with an assumed right atrial pressure of 3 mmHg, the estimated right ventricular systolic pressure is 22.6 mmHg. Left Atrium: Left atrial size was moderately dilated. Right Atrium: Right atrial size was normal in size. Pericardium: There is no evidence of pericardial effusion. Mitral Valve: The mitral valve is abnormal. There is mild  calcification of the anterior and posterior mitral valve leaflet(s). Trivial mitral valve regurgitation. MV peak gradient, 10.9 mmHg. The mean mitral valve gradient is 3.0 mmHg. Tricuspid Valve: The tricuspid valve is grossly normal. Tricuspid valve regurgitation is trivial. Aortic Valve: The aortic valve is tricuspid. Aortic valve regurgitation is not visualized. Mild aortic stenosis is present. Aortic valve mean gradient measures 12.0 mmHg. Aortic valve peak gradient measures 24.4 mmHg. Aortic valve area, by VTI measures 1.72 cm. Pulmonic Valve: The pulmonic valve was normal in structure. Pulmonic valve regurgitation is not visualized. Aorta: The aortic root and ascending aorta are  structurally normal, with no evidence of dilitation. Venous: The inferior vena cava is normal in size with greater than 50% respiratory variability, suggesting right atrial pressure of 3 mmHg. IAS/Shunts: No atrial level shunt detected by color flow Doppler.  LEFT VENTRICLE PLAX 2D                        Biplane EF (MOD) LVIDd:         4.80 cm         LV Biplane EF:   Left LVIDs:         2.80 cm                          ventricular LV PW:         1.30 cm                          ejection LV IVS:        1.30 cm                          fraction by LVOT diam:     2.00 cm                          2D MOD LV SV:         89                               biplane is LV SV Index:   42                               71.7 %. LVOT Area:     3.14 cm                                Diastology                                LV e' medial:    6.85 cm/s LV Volumes (MOD)               LV E/e' medial:  11.6 LV vol d, MOD    74.7 ml       LV e' lateral:   11.20 cm/s A2C:                           LV E/e' lateral: 7.1 LV vol d, MOD    70.5 ml A4C: LV vol s, MOD    20.1 ml A2C: LV vol s, MOD    21.5 ml A4C: LV SV MOD A2C:   54.6 ml LV SV MOD A4C:   70.5 ml LV SV MOD BP:    52.6 ml RIGHT VENTRICLE RV Basal diam:  3.35 cm RV Mid diam:    3.00 cm RV S prime:      14.90 cm/s TAPSE (M-mode): 3.3 cm LEFT ATRIUM             Index        RIGHT ATRIUM           Index LA diam:  4.10 cm 1.93 cm/m   RA Area:     18.90 cm LA Vol (A2C):   62.0 ml 29.15 ml/m  RA Volume:   53.10 ml  24.97 ml/m LA Vol (A4C):   81.6 ml 38.37 ml/m LA Biplane Vol: 76.8 ml 36.11 ml/m  AORTIC VALVE                     PULMONIC VALVE AV Area (Vmax):    1.97 cm      PV Vmax:       1.15 m/s AV Area (Vmean):   1.78 cm      PV Peak grad:  5.3 mmHg AV Area (VTI):     1.72 cm AV Vmax:           247.00 cm/s AV Vmean:          159.500 cm/s AV VTI:            0.516 m AV Peak Grad:      24.4 mmHg AV Mean Grad:      12.0 mmHg LVOT Vmax:         155.00 cm/s LVOT Vmean:        90.400 cm/s LVOT VTI:          0.283 m LVOT/AV VTI ratio: 0.55  AORTA Ao Root diam: 2.80 cm MITRAL VALVE                TRICUSPID VALVE MV Area (PHT): 3.21 cm     TR Peak grad:   21.9 mmHg MV Area VTI:   1.99 cm     TR Vmax:        234.00 cm/s MV Peak grad:  10.9 mmHg MV Mean grad:  3.0 mmHg     SHUNTS MV Vmax:       1.65 m/s     Systemic VTI:  0.28 m MV Vmean:      83.0 cm/s    Systemic Diam: 2.00 cm MV Decel Time: 236 msec MV E velocity: 79.50 cm/s MV A velocity: 141.00 cm/s MV E/A ratio:  0.56 Lyman Bishop MD Electronically signed by Lyman Bishop MD Signature Date/Time: 04/06/2022/6:10:02 PM    Final    US Venous Img Lower Bilateral (DVT)  Result Date: 04/06/2022 CLINICAL DATA:  81 year old female with dyspnea for 2 weeks and swelling of the lower extremity EXAM: BILATERAL LOWER EXTREMITY VENOUS DOPPLER ULTRASOUND TECHNIQUE: Gray-scale sonography with graded compression, as well as color Doppler and duplex ultrasound were performed to evaluate the lower extremity deep venous systems from the level of the common femoral vein and including the common femoral, femoral, profunda femoral, popliteal and calf veins including the posterior tibial, peroneal and gastrocnemius veins when visible. The superficial great saphenous vein was  also interrogated. Spectral Doppler was utilized to evaluate flow at rest and with distal augmentation maneuvers in the common femoral, femoral and popliteal veins. COMPARISON:  None Available. FINDINGS: RIGHT LOWER EXTREMITY Common Femoral Vein: No evidence of thrombus. Normal compressibility, respiratory phasicity and response to augmentation. Saphenofemoral Junction: No evidence of thrombus. Normal compressibility and flow on color Doppler imaging. Profunda Femoral Vein: No evidence of thrombus. Normal compressibility and flow on color Doppler imaging. Femoral Vein: No evidence of thrombus. Normal compressibility, respiratory phasicity and response to augmentation. Popliteal Vein: No evidence of thrombus. Normal compressibility, respiratory phasicity and response to augmentation. Calf Veins: No evidence of thrombus. Normal compressibility and flow on color Doppler imaging. Superficial Great Saphenous Vein: No evidence  of thrombus. Normal compressibility and flow on color Doppler imaging Other Findings:  None. LEFT LOWER EXTREMITY Common Femoral Vein: No evidence of thrombus. Normal compressibility, respiratory phasicity and response to augmentation. Saphenofemoral Junction: No evidence of thrombus. Normal compressibility and flow on color Doppler imaging. Profunda Femoral Vein: No evidence of thrombus. Normal compressibility and flow on color Doppler imaging. Femoral Vein: No evidence of thrombus. Normal compressibility, respiratory phasicity and response to augmentation. Popliteal Vein: No evidence of thrombus. Normal compressibility, respiratory phasicity and response to augmentation. Calf Veins: Occlusive thrombus of the posterior tibial vein, noncompressible. Peroneal vein patent and compressible with flow maintained. Anterior tibial vein patent. Superficial Great Saphenous Vein: No evidence of thrombus. Normal compressibility and flow on color Doppler imaging. Other Findings:  None. IMPRESSION: Directed  duplex left lower extremity positive for distal DVT of the posterior tibial vein. Negative for proximal left DVT. Directed duplex right lower extremity negative for DVT. These results will be called to the ordering clinician or representative by the Radiologist Assistant, and communication documented in the PACS or [employer]. Signed, Dulcy Fanny. Nadene Rubins, RPVI [facility] Electronically Signed   By: Corrie Mckusick D.O.   On: 04/06/2022 12:00   US Renal  Result Date: 04/06/2022 CLINICAL DATA:  Acute renal injury EXAM: RENAL / URINARY TRACT ULTRASOUND COMPLETE COMPARISON:  None Available. FINDINGS: Right Kidney: Renal measurements: 10.7 x 5.1 x 4.9 cm = volume: 167 mL. Two small (1.4 cm) anechoic cysts. No hydronephrosis Left Kidney: Renal measurements: 11.4 x 7.1 x 5.6 cm = volume: 237 mL. Single small 2.3 cm anechoic cyst. No hydronephrosis. Bladder: Appears normal for degree of bladder distention. Other: None. IMPRESSION: 1. No hydronephrosis. 2. Bilateral benign-appearing renal cysts. 3. Normal bladder. Electronically Signed   By: Suzy Bouchard M.D.   On: 04/06/2022 10:39   DG Chest 2 View  Result Date: 04/05/2022 CLINICAL DATA:  Pt c/o sob and weakness for several days. Denies cp, fever or n/v. HX of hypertension and CA. EXAM: CHEST - 2 VIEW COMPARISON:  01/08/2020. FINDINGS: Mild enlargement of the cardiopericardial silhouette. No mediastinal or hilar masses. No evidence of adenopathy. Clear lungs.  No pleural effusion or pneumothorax. Skeletal structures are intact. IMPRESSION: No acute cardiopulmonary disease. Electronically Signed   By: Lajean Manes M.D.   On: 04/05/2022 14:04    Orson Eva, DO  [facility]  If 7PM-7AM, please contact night-coverage [email or web address] Password [facility] 04/12/2022, 4:34 PM   LOS: 6 days

## 2022-04-12 NOTE — Progress Notes (Signed)
ANTICOAGULATION CONSULT NOTE -  Pharmacy Consult for Heparin Indication:  LLE DVT 04/06/22  Allergies  Allergen Reactions   Ace Inhibitors Swelling   Sulfa Antibiotics Swelling and Rash   Patient Measurements: Height: '5\' 5"'$  (165.1 cm) Weight: 107.6 kg (237 lb 3.4 oz) IBW/kg (Calculated) : 57 Heparin Dosing Weight: 82.2 kg  Labs: Recent Labs     0000 04/09/22 0920 04/10/22 0454 04/10/22 0904 04/10/22 1851 04/11/22 0440 04/12/22 0544  HGB   < >  --  8.7*  --   --  7.8* 8.4*  HCT  --   --  28.2*  --   --  24.4* 26.0*  PLT  --   --  326  --   --  340 362  LABPROT  --  12.9  --   --   --   --   --   INR  --  1.0  --   --   --   --   --   HEPARINUNFRC  --   --   --    < > 0.55 0.60 0.63  CREATININE  --   --  2.90*  --   --  2.86*  --    < > = values in this interval not displayed.    Estimated Creatinine Clearance: 19.8 mL/min (A) (by C-G formula based on SCr of 2.86 mg/dL (H)).  Assessment: 81 year old W  found to have LLE DVT 04/06/22. PMH significant for anemia, breast cancer and obesity. No anticoagulation prior to admission. Pharmacy consulted for heparin.    Heparin level therapeutic. H&H stable, PLTs stable, no bleeding reported. (See above labs)  Goal of Therapy:  Heparin level 0.3-0.7 units/ml Monitor platelets by anticoagulation protocol: Yes   Plan:  Continue heparin infusion at 1100 units/hr Monitor daily heparin level, CBC Monitor for signs/symptoms of bleeding   F/u longterm anticoagulation plan   Hart Robinsons, PharmD Clinical Pharmacist 04/12/2022 7:38 AM

## 2022-04-12 NOTE — Progress Notes (Signed)
Patient up in the recliner after ambulating down the hallway with daughter at bedside. No complaints or needs at this time.

## 2022-04-12 NOTE — Progress Notes (Signed)
[name] NEPHROLOGY PROGRESS NOTE  Assessment/ Plan: Pt is a 81 y.o. yo female with history of hypertension, breast cancer status postlumpectomy, radiation and currently on anastrozole, anemia presented with shortness of breath, seen in consultation for acute kidney injury.  #Acute kidney injury, nonoliguric : The creatinine level was first noted to be elevated to 1.9 in 01/2022, now presented with creatinine level of 3.16 on admission associated with proteinuria (UPC 3.3) and microscopic hematuria. The kidney ultrasound unremarkable except bilateral benign-appearing cyst.  The GN work-up positive for ANA, anti-dsDNA antibody P-ANCA and antihistone antibody.  Anti-GBM, C3, C4 negative.  Protein electrophoresis with no M spike.  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  Antihistone is positive.  We discontinued hydralazine immediately (she was on a high dose '100mg'$  TID) and started empiric prednisone on 6/14.  The patient underwent native kidney biopsy by IR on 6/15; results should be back sometime Monday.    Continue prednisone and she will possibly need immunosuppression like rituximab versus Cytoxan pending the biopsy results.  I reviewed this plan with the patient today and she agreed with it.  Hepatitis panel negative.  Fortunately, renal function is improving.  Continue to monitor.  Strict ins and out, daily lab.  # Anemia, ? related with vasculitis: Iron saturation 33%, Aranesp 60 on 6/16.  No sign of bleeding.  # HTN/volume: Blood pressure elevated.  Amlodipine just incr to 10 mg.  Off of hydralazine as discussed above.  No ACE inhibitor or ARB because of AKI.  #Left leg DVT: Currently on heparin.  She has a history of breast cancer.   Subjective: Seen and examined at the bedside.  Feels good without any complaint or concern; ambulating.  Denies f/c/n/v. Granddaughter bedside.  Objective Vital signs in last 24 hours: Vitals:   04/12/22 0633 04/12/22  0755 04/12/22 1059 04/12/22 1335  BP: (!) 177/64 (!) 181/64 (!) 153/57 (!) 148/55  Pulse: 67 70 (!) 53 (!) 59  Resp:    18  Temp: 98.2 F (36.8 C)   98.3 F (36.8 C)  TempSrc: Oral   Oral  SpO2: 96% 96%  96%  Weight:      Height:       Weight change:   Intake/Output Summary (Last 24 hours) at 04/12/2022 1600 Last data filed at 04/12/2022 1300 Gross per 24 hour  Intake 1133.17 ml  Output 1200 ml  Net -66.83 ml       Labs: RENAL PANEL Recent Labs    04/05/22 1317 04/06/22 0444 04/07/22 0514 04/08/22 0452 04/09/22 0531 04/10/22 0454 04/11/22 0440 04/12/22 0544  NA 139 139 139 136 134* 136 135 136  K 4.0 4.1 3.8 3.8 4.1 4.5 4.7 4.6  CL 106 111 110 107 106 105 105 105  CO2 '26 27 24 24 24 24 24 25  '$ GLUCOSE 103* 102* 100* 106* 110* 113* 102* 93  BUN 40* 34* 32* 31* 41* 48* 57* 59*  CREATININE 3.16* 2.99* 2.75* 2.79* 3.14* 2.90* 2.86* 2.48*  CALCIUM 9.9 9.2 9.1 9.2 9.5 9.9 9.8 9.7  PHOS  --   --  3.2 3.0 3.5 4.1 3.6 3.2  ALBUMIN  --   --  2.7* 2.6* 2.7* 2.9* 2.6* 2.7*     Liver Function Tests: Recent Labs  Lab 04/10/22 0454 04/11/22 0440 04/12/22 0544  ALBUMIN 2.9* 2.6* 2.7*   No results for input(s): "LIPASE", "AMYLASE" in the last 168 hours. No results for input(s): "AMMONIA" in the last 168 hours. CBC: Recent  Labs    04/08/22 0452 04/09/22 0531 04/10/22 0454 04/11/22 0440 04/12/22 0544  HGB 8.1* 8.2* 8.7* 7.8* 8.4*  MCV 90.8 90.2 91.3 90.0 89.7  FERRITIN  --  79  --   --   --   TIBC  --  239*  --   --   --   IRON  --  79  --   --   --     Cardiac Enzymes: No results for input(s): "CKTOTAL", "CKMB", "CKMBINDEX", "TROPONINI" in the last 168 hours. CBG: No results for input(s): "GLUCAP" in the last 168 hours.  Iron Studies: No results for input(s): "IRON", "TIBC", "TRANSFERRIN", "FERRITIN" in the last 72 hours.  Studies/Results: No results found.  Medications: Infusions:  heparin 1,100 Units/hr (04/12/22 1152)    Scheduled Medications:   amLODipine  10 mg Oral Daily   anastrozole  1 mg Oral Daily   brimonidine  1 drop Both Eyes BID   And   timolol  1 drop Both Eyes BID   metoprolol tartrate  12.5 mg Oral BID   pantoprazole  40 mg Oral Daily   predniSONE  60 mg Oral Daily    have reviewed scheduled and prn medications.  Physical Exam: General: Lying on bed comfortable, not in distress Heart:RRR, s1s2 nl Lungs: Clear b/l, no rales Abdomen:soft, Non-tender, non-distended Extremities: Trace peripheral pitting edema present. Neurology: Alert, awake and following commands.  [name] [name] 04/12/2022,4:00 PM  LOS: 6 days

## 2022-04-13 ENCOUNTER — Other Ambulatory Visit: Payer: Self-pay | Admitting: Internal Medicine

## 2022-04-13 DIAGNOSIS — N179 Acute kidney failure, unspecified: Secondary | ICD-10-CM

## 2022-04-13 DIAGNOSIS — I82402 Acute embolism and thrombosis of unspecified deep veins of left lower extremity: Secondary | ICD-10-CM

## 2022-04-13 DIAGNOSIS — D509 Iron deficiency anemia, unspecified: Secondary | ICD-10-CM | POA: Diagnosis not present

## 2022-04-13 DIAGNOSIS — N009 Acute nephritic syndrome with unspecified morphologic changes: Secondary | ICD-10-CM | POA: Diagnosis not present

## 2022-04-13 LAB — RENAL FUNCTION PANEL
Albumin: 2.7 g/dL — ABNORMAL LOW (ref 3.5–5.0)
Anion gap: 5 (ref 5–15)
BUN: 55 mg/dL — ABNORMAL HIGH (ref 8–23)
CO2: 26 mmol/L (ref 22–32)
Calcium: 9.9 mg/dL (ref 8.9–10.3)
Chloride: 106 mmol/L (ref 98–111)
Creatinine, Ser: 2.43 mg/dL — ABNORMAL HIGH (ref 0.44–1.00)
GFR, Estimated: 20 mL/min — ABNORMAL LOW (ref >60)
Glucose, Bld: 91 mg/dL (ref 70–99)
Phosphorus: 3.5 mg/dL (ref 2.5–4.6)
Potassium: 4.6 mmol/L (ref 3.5–5.1)
Sodium: 137 mmol/L (ref 135–145)

## 2022-04-13 LAB — CBC
HCT: 26.5 % — ABNORMAL LOW (ref 36.0–46.0)
Hemoglobin: 8.4 g/dL — ABNORMAL LOW (ref 12.0–15.0)
MCH: 28.8 pg (ref 26.0–34.0)
MCHC: 31.7 g/dL (ref 30.0–36.0)
MCV: 90.8 fL (ref 80.0–100.0)
Platelets: 370 10*3/uL (ref 150–400)
RBC: 2.92 MIL/uL — ABNORMAL LOW (ref 3.87–5.11)
RDW: 14.6 % (ref 11.5–15.5)
WBC: 10.5 10*3/uL (ref 4.0–10.5)
nRBC: 0.2 % (ref 0.0–0.2)

## 2022-04-13 LAB — HEPARIN LEVEL (UNFRACTIONATED): Heparin Unfractionated: 0.47 IU/mL (ref 0.30–0.70)

## 2022-04-13 MED ORDER — APIXABAN 5 MG PO TABS
10.0000 mg | ORAL_TABLET | Freq: Two times a day (BID) | ORAL | Status: DC
  Administered 2022-04-13: 10 mg via ORAL
  Filled 2022-04-13: qty 2

## 2022-04-13 MED ORDER — APIXABAN 5 MG PO TABS: 10.0000 mg | ORAL_TABLET | Freq: Two times a day (BID) | ORAL | 0 refills | Status: DC

## 2022-04-13 MED ORDER — FUROSEMIDE 40 MG PO TABS: 40.0000 mg | ORAL_TABLET | Freq: Every day | ORAL | 0 refills | Status: AC

## 2022-04-13 MED ORDER — APIXABAN 5 MG PO TABS: 5.0000 mg | ORAL_TABLET | Freq: Two times a day (BID) | ORAL | Status: DC

## 2022-04-13 MED ORDER — FUROSEMIDE 40 MG PO TABS
40.0000 mg | ORAL_TABLET | Freq: Every day | ORAL | Status: DC
  Administered 2022-04-13: 40 mg via ORAL
  Filled 2022-04-13 (×2): qty 1

## 2022-04-13 MED ORDER — METOPROLOL TARTRATE 25 MG PO TABS: 12.5000 mg | ORAL_TABLET | Freq: Two times a day (BID) | ORAL | 1 refills | Status: DC

## 2022-04-13 MED ORDER — PREDNISONE 20 MG PO TABS: 60.0000 mg | ORAL_TABLET | Freq: Every day | ORAL | 0 refills | Status: DC

## 2022-04-13 NOTE — TOC Transition Note (Signed)
Transition of Care [facility]) - CM/SW Discharge Note   Patient Details  Name: [name] [name] MRN: [id number] Date of Birth: October 17, 1941  Transition of [facility]) CM/SW Contact:  Salome Arnt, LCSW Phone Number: 04/13/2022, 11:38 AM   Clinical Narrative:  Pt d/c today. Pt aware and agreeable and reports she has a ride. Lattie Haw with [facility] notified of d/c. Order in. RN updated. No other needs reported.        Barriers to Discharge: Barriers Resolved   Patient Goals and CMS Choice Patient states their goals for this hospitalization and ongoing recovery are:: return home   Choice offered to / list presented to : Patient  Discharge Placement                  Name of family member notified: pt only Patient and family notified of of transfer: 04/13/22  Discharge Plan and Services                          HH Arranged: PT [facility] Agency: [place] Date [place]: 04/13/22 Time [place]: 1137 Representative spoke with at [place]: [place] Determinants of Health ([place]) Interventions     Readmission Risk Interventions     No data to display

## 2022-04-13 NOTE — Progress Notes (Signed)
ANTICOAGULATION CONSULT NOTE -  Pharmacy Consult for Heparin Indication:  LLE DVT 04/06/22  Allergies  Allergen Reactions   Ace Inhibitors Swelling   Sulfa Antibiotics Swelling and Rash   Patient Measurements: Height: '5\' 5"'$  (165.1 cm) Weight: 107.6 kg (237 lb 3.4 oz) IBW/kg (Calculated) : 57 Heparin Dosing Weight: 82.2 kg  Labs: Recent Labs    04/11/22 0440 04/12/22 0544 04/13/22 0451  HGB 7.8* 8.4* 8.4*  HCT 24.4* 26.0* 26.5*  PLT 340 362 370  HEPARINUNFRC 0.60 0.63 0.47  CREATININE 2.86* 2.48* 2.43*    Estimated Creatinine Clearance: 23.3 mL/min (A) (by C-G formula based on SCr of 2.43 mg/dL (H)).  Assessment: 81 year old W  found to have LLE DVT 04/06/22. PMH significant for anemia, breast cancer and obesity. No anticoagulation prior to admission. Pharmacy consulted for heparin.    Heparin level 0.47, therapeutic.  H&H stable, PLTs stable, no bleeding reported. (See above labs)  Goal of Therapy:  Heparin level 0.3-0.7 units/ml Monitor platelets by anticoagulation protocol: Yes   Plan:  Continue heparin infusion at 1100 units/hr Monitor daily heparin level, CBC Monitor for signs/symptoms of bleeding   F/u longterm anticoagulation plan   Isac Sarna, BS Pharm D, BCPS Clinical Pharmacist 04/13/2022 7:50 AM

## 2022-04-13 NOTE — Care Management Important Message (Signed)
Important Message  Patient Details  Name: [name] [name] MRN: [id number] Date of Birth: 09-04-1941   Medicare Important Message Given:  Yes     Tommy Medal 04/13/2022, 11:45 AM

## 2022-04-13 NOTE — Discharge Summary (Signed)
Physician Discharge Summary   Patient: [name] [name] MRN: [id number] DOB: 05-29-1941  Admit date:     04/05/2022  Discharge date: 04/13/22  Discharge Physician: Shanon Brow Keara Pagliarulo   PCP: Monico Blitz, MD   Recommendations at discharge:   Please follow up with primary care provider within 1-2 weeks  Please repeat BMP and CBC 04/16/22--labs already ordered.  Instructions given to patient to come to [place] lab on 04/16/22  Please follow up on/with nephrology 6/26    Hospital Course: As per H&P written by Dr. Denton Brick on 04/05/2022 [name] [name] is a 81 y.o. female with medical history significant for recurrent breast cancer, Fe deficiency anemia, hyperparathyroidism, hypertension. Patient presented to the ED with intermittent difficulty breathing over the past 2 weeks.  Symptoms are mostly with exertion.  She denies cough.  No chest pain.  She reports swelling of her lower extremities today.  She feels her abdomen is bloated.  Checks her weight regularly, she reports there has been no change. She denies vomiting, no loose stools.  She has maintained good oral intake.  Not on any diuretics.   ED Course:  Temp 97.6.  Heart rate 70s to 80s.  Respiratory rate 13-24.  Blood pressure systolic 458K to 998P. Creatinine elevated at 3.16, BNP 73.  Chest x-ray without acute abnormality. 1 L bolus given.  Renal ultrasound ordered which did not show hydronephrosis.  Her renal function continued to worsen.  Nephrology was consulted.  Auto-antibodies were ordered and showed positive ANA, anti-dsDNA, anti-histone, P-ANCA.  She was started on empiric prednisone.  Renal biopsy was peformed ont 6/15.  She was found to have LLE DVT for which she was started on IV heparin.  This was held for her renal biopsy and restarted 12 hours after.    Assessment and Plan: * AKI (acute kidney injury) ([place]) -Cr-3.16, per Care Everywhere last check 4/24 creatinine was 1.9.  -positive autoantibodies suggest hydralazine induced  GN -appreciate renal  -ANA and anti-dsDNA positive -PANCA positive -anti-histone antibody positive -SPEP neg M-spike -renal US without hydronephrosis. 04/09/22--planning renal biopsy -antiGBM, C3, C4 neg -empiric prednisone started 6/14 -serum creatinine gradually improving--2.43 on day of d/c 6/19--nephrology cleared pt for d/c;  Repeat renal panel and CBC on 6/22--pt given instructions to come to [place] lab   Acute glomerulonephritis Work up as above Office follow up with [facility] on 6/26  Left leg DVT ([facility]) -Positive lower extremity Dopplers for left distal leg DVT -Heparin initiated. -At least 6 months of anticoagulation using Eliquis will be required at discharge -vasculitis and prior history of breast cancer are risk factors -continue IV heparin>>held for renal biopsy 6/15 -restarted [facility] 6/16>>monitor CBC>>Hgb remains stable D/c home with apixaban at least 6 months Hgb 8.4 on day of d/c  Obesity, Class III, BMI 40-49.9 (morbid obesity) ([place]) -Body mass index is 39.47 kg/m. -Lifestyle modification  Dyspnea -Dyspnea with exertion. -suspect symptomatic anemia -1 unit PRBCs transfused >>dyspnea improved; stable on RA -04/06/22 echo G1DD LV with hyperdynamic function, no wall motion abnormalities, ejection fraction 70 to 75%.  No significant valvular disorder.   -Lower extremity Doppler with positive left leg DVT; therapy with anticoagulation will be initiated.    -overall improved.  Stable on RA   HTN (hypertension) -Systolic 382N to 053Z at time of admission. -Continue amlodipine -hydralazine discontinued due to concern for drug induced GN -added low dose metoprolol--titration limited by bradycardia -6/19  Lasix added by nephrology  Malignant neoplasm of upper outer quadrant of  female breast [facility]) -Status post lumpectomy, radiation treatment, currently on anastrozole. -Continue patient follow-up with oncology service.  Anemia, iron deficiency -In the  setting of anemia chronic kidney disease -No overt bleeding appreciated -Given symptomatic symptoms after discussing with nephrology service 1 unit of PRBCs transfused -IV iron and Epogen therapy as per renal discretion. -Current hemoglobin after transfusion 8.1>>8.7>>7.8>>8.4>>8.4         Consultants: renal, IR Procedures performed: renal biopsy  Disposition: Home Diet recommendation:  Cardiac diet DISCHARGE MEDICATION: Allergies as of 04/13/2022       Reactions   Ace Inhibitors Swelling   Sulfa Antibiotics Swelling, Rash        Medication List     STOP taking these medications    hydrALAZINE 100 MG tablet Commonly known as: APRESOLINE       TAKE these medications    amLODipine 10 MG tablet Commonly known as: NORVASC Take 10 mg by mouth daily.   anastrozole 1 MG tablet Commonly known as: ARIMIDEX Take 1 mg by mouth daily.   apixaban 5 MG Tabs tablet Commonly known as: ELIQUIS Take 2 tablets (10 mg total) by mouth 2 (two) times daily. X 7 days, then one tab (5 mg) bid starting 04/20/22   cetirizine 10 MG tablet Commonly known as: ZYRTEC Take 10 mg by mouth every morning.   Cholecalciferol 25 MCG (1000 UT) tablet Take by mouth.   Combigan 0.2-0.5 % ophthalmic solution Generic drug: brimonidine-timolol Place 1 drop into both eyes every 12 (twelve) hours.   famotidine 10 MG tablet Commonly known as: PEPCID Take 10 mg by mouth 2 (two) times daily.   furosemide 40 MG tablet Commonly known as: LASIX Take 1 tablet (40 mg total) by mouth daily. Start taking on: April 14, 2022   metoprolol tartrate 25 MG tablet Commonly known as: LOPRESSOR Take 0.5 tablets (12.5 mg total) by mouth 2 (two) times daily.   predniSONE 20 MG tablet Commonly known as: DELTASONE Take 3 tablets (60 mg total) by mouth daily. Start taking on: April 14, 2022   Rocklatan 0.02-0.005 % Soln Generic drug: Netarsudil-Latanoprost Apply 1 drop to eye at bedtime.         Discharge Exam: Filed Weights   04/05/22 1150 04/05/22 1847  Weight: 106.6 kg 107.6 kg   HEENT:  [employer]/AT, No thrush, no icterus CV:  RRR, no rub, no S3, no S4 Lung:  CTA, no wheeze, no rhonchi Abd:  soft/+BS, NT Ext:  1 + LE edema, no lymphangitis, no synovitis, no rash   Condition at discharge: stable  The results of significant diagnostics from this hospitalization (including imaging, microbiology, ancillary and laboratory) are listed below for reference.   Imaging Studies: US BIOPSY (KIDNEY)  Result Date: 04/09/2022 CLINICAL DATA:  Renal insufficiency, biopsy requested EXAM: ULTRASOUND AND CT-GUIDED CORE RENAL BIOPSY COMPARISON:  None Available. TECHNIQUE: Survey ultrasound was performed and an appropriate skin entry site was localized. Site was marked, prepped with chlorhexidine, draped in usual sterile fashion, infiltrated locally with 1% lidocaine. Intravenous Fentanyl 64mg and Versed 1.'5mg'$  were administered as conscious sedation during continuous monitoring of the patient's level of consciousness and physiological / cardiorespiratory status by the radiology RN, with a total moderate sedation time of 78 minutes. Under real time ultrasound guidance, a 15 gauge trocar needle was advanced to the margin of the lower pole of the left kidney for 2 coaxial 16 gauge core biopsy needle passes. The first was mainly fat. The second was small. Subsequently, there was limited  visualization precluding additional safe core biopsies with ultrasound. For this reason, patient was transferred to CT, placed prone, and limited axial scans obtained to localize the lower pole left kidney again. There was only minimal regional hemorrhage. Subsequently, after the region was prepped and draped and infiltrated with 1% lidocaine, the 15 gauge needle was advanced to the margin of the lower pole left kidney again and additional core samples were obtained which appeared grossly adequate. Postprocedure scan shows no  significant hemorrhage or other apparent complication. The patient tolerated procedure well. COMPLICATIONS: None. IMPRESSION: 1. Technically successful ultrasound- and CT-guided core renal biopsy , left lower pole. Electronically Signed   By: Lucrezia Europe M.D.   On: 04/09/2022 15:20   CT RENAL BIOPSY  Result Date: 04/09/2022 CLINICAL DATA:  Renal insufficiency, biopsy requested EXAM: ULTRASOUND AND CT-GUIDED CORE RENAL BIOPSY COMPARISON:  None Available. TECHNIQUE: Survey ultrasound was performed and an appropriate skin entry site was localized. Site was marked, prepped with chlorhexidine, draped in usual sterile fashion, infiltrated locally with 1% lidocaine. Intravenous Fentanyl 70mg and Versed 1.'5mg'$  were administered as conscious sedation during continuous monitoring of the patient's level of consciousness and physiological / cardiorespiratory status by the radiology RN, with a total moderate sedation time of 78 minutes. Under real time ultrasound guidance, a 15 gauge trocar needle was advanced to the margin of the lower pole of the left kidney for 2 coaxial 16 gauge core biopsy needle passes. The first was mainly fat. The second was small. Subsequently, there was limited visualization precluding additional safe core biopsies with ultrasound. For this reason, patient was transferred to CT, placed prone, and limited axial scans obtained to localize the lower pole left kidney again. There was only minimal regional hemorrhage. Subsequently, after the region was prepped and draped and infiltrated with 1% lidocaine, the 15 gauge needle was advanced to the margin of the lower pole left kidney again and additional core samples were obtained which appeared grossly adequate. Postprocedure scan shows no significant hemorrhage or other apparent complication. The patient tolerated procedure well. COMPLICATIONS: None. IMPRESSION: 1. Technically successful ultrasound- and CT-guided core renal biopsy , left lower pole.  Electronically Signed   By: DLucrezia EuropeM.D.   On: 04/09/2022 15:20   ECHOCARDIOGRAM COMPLETE  Result Date: 04/06/2022    ECHOCARDIOGRAM REPORT   Patient Name:   MDONELLA PASCARELLAJUMPER Date of Exam: 04/06/2022 Medical Rec #:  [id number]        Height:       65.0 in Accession #:    [id number]       Weight:       237.2 lb Date of Birth:  629-Jan-1942        BSA:          2.127 m Patient Age:    89years          BP:           182/72 mmHg Patient Gender: F                 HR:           76 bpm. [facility]  [facility]: 2D Echo, Cardiac Doppler and Color Doppler Indications:    Dyspnea  History:        Patient has no prior history of Echocardiogram examinations.                 Signs/Symptoms:Dyspnea; Risk Factors:Hypertension.  Sonographer:    DWenda Low  Referring Phys: Malcom  Sonographer Comments: Patient is morbidly obese. IMPRESSIONS  1. Left ventricular ejection fraction, by estimation, is 70 to 75%. Left ventricular ejection fraction by 2D MOD biplane is 71.7 %. The left ventricle has hyperdynamic function. The left ventricle has no regional wall motion abnormalities. There is mild  left ventricular hypertrophy. Left ventricular diastolic parameters are consistent with Grade I diastolic dysfunction (impaired relaxation).  2. Right ventricular systolic function is normal. The right ventricular size is normal. There is normal pulmonary artery systolic pressure. The estimated right ventricular systolic pressure is 90.3 mmHg.  3. Left atrial size was moderately dilated.  4. The mitral valve is abnormal. Trivial mitral valve regurgitation.  5. The aortic valve is tricuspid. Aortic valve regurgitation is not visualized. Mild aortic valve stenosis. Aortic valve area, by VTI measures 1.72 cm. Aortic valve mean gradient measures 12.0 mmHg. Aortic valve Vmax measures 2.47 m/s.  6. The inferior vena cava is normal in size with greater than 50% respiratory variability, suggesting right atrial  pressure of 3 mmHg. Comparison(s): No prior Echocardiogram. FINDINGS  Left Ventricle: Left ventricular ejection fraction, by estimation, is 70 to 75%. Left ventricular ejection fraction by 2D MOD biplane is 71.7 %. The left ventricle has hyperdynamic function. The left ventricle has no regional wall motion abnormalities. The left ventricular internal cavity size was normal in size. There is mild left ventricular hypertrophy. Left ventricular diastolic parameters are consistent with Grade I diastolic dysfunction (impaired relaxation). Indeterminate filling pressures. Right Ventricle: The right ventricular size is normal. No increase in right ventricular wall thickness. Right ventricular systolic function is normal. There is normal pulmonary artery systolic pressure. The tricuspid regurgitant velocity is 2.34 m/s, and  with an assumed right atrial pressure of 3 mmHg, the estimated right ventricular systolic pressure is 00.9 mmHg. Left Atrium: Left atrial size was moderately dilated. Right Atrium: Right atrial size was normal in size. Pericardium: There is no evidence of pericardial effusion. Mitral Valve: The mitral valve is abnormal. There is mild calcification of the anterior and posterior mitral valve leaflet(s). Trivial mitral valve regurgitation. MV peak gradient, 10.9 mmHg. The mean mitral valve gradient is 3.0 mmHg. Tricuspid Valve: The tricuspid valve is grossly normal. Tricuspid valve regurgitation is trivial. Aortic Valve: The aortic valve is tricuspid. Aortic valve regurgitation is not visualized. Mild aortic stenosis is present. Aortic valve mean gradient measures 12.0 mmHg. Aortic valve peak gradient measures 24.4 mmHg. Aortic valve area, by VTI measures 1.72 cm. Pulmonic Valve: The pulmonic valve was normal in structure. Pulmonic valve regurgitation is not visualized. Aorta: The aortic root and ascending aorta are structurally normal, with no evidence of dilitation. Venous: The inferior vena cava is  normal in size with greater than 50% respiratory variability, suggesting right atrial pressure of 3 mmHg. IAS/Shunts: No atrial level shunt detected by color flow Doppler.  LEFT VENTRICLE PLAX 2D                        Biplane EF (MOD) LVIDd:         4.80 cm         LV Biplane EF:   Left LVIDs:         2.80 cm                          ventricular LV PW:         1.30 cm  ejection LV IVS:        1.30 cm                          fraction by LVOT diam:     2.00 cm                          2D MOD LV SV:         89                               biplane is LV SV Index:   42                               71.7 %. LVOT Area:     3.14 cm                                Diastology                                LV e' medial:    6.85 cm/s LV Volumes (MOD)               LV E/e' medial:  11.6 LV vol d, MOD    74.7 ml       LV e' lateral:   11.20 cm/s A2C:                           LV E/e' lateral: 7.1 LV vol d, MOD    70.5 ml A4C: LV vol s, MOD    20.1 ml A2C: LV vol s, MOD    21.5 ml A4C: LV SV MOD A2C:   54.6 ml LV SV MOD A4C:   70.5 ml LV SV MOD BP:    52.6 ml RIGHT VENTRICLE RV Basal diam:  3.35 cm RV Mid diam:    3.00 cm RV S prime:     14.90 cm/s TAPSE (M-mode): 3.3 cm LEFT ATRIUM             Index        RIGHT ATRIUM           Index LA diam:        4.10 cm 1.93 cm/m   RA Area:     18.90 cm LA Vol (A2C):   62.0 ml 29.15 ml/m  RA Volume:   53.10 ml  24.97 ml/m LA Vol (A4C):   81.6 ml 38.37 ml/m LA Biplane Vol: 76.8 ml 36.11 ml/m  AORTIC VALVE                     PULMONIC VALVE AV Area (Vmax):    1.97 cm      PV Vmax:       1.15 m/s AV Area (Vmean):   1.78 cm      PV Peak grad:  5.3 mmHg AV Area (VTI):     1.72 cm AV Vmax:           247.00 cm/s AV Vmean:          159.500 cm/s AV VTI:  0.516 m AV Peak Grad:      24.4 mmHg AV Mean Grad:      12.0 mmHg LVOT Vmax:         155.00 cm/s LVOT Vmean:        90.400 cm/s LVOT VTI:          0.283 m LVOT/AV VTI ratio: 0.55  AORTA Ao Root diam:  2.80 cm MITRAL VALVE                TRICUSPID VALVE MV Area (PHT): 3.21 cm     TR Peak grad:   21.9 mmHg MV Area VTI:   1.99 cm     TR Vmax:        234.00 cm/s MV Peak grad:  10.9 mmHg MV Mean grad:  3.0 mmHg     SHUNTS MV Vmax:       1.65 m/s     Systemic VTI:  0.28 m MV Vmean:      83.0 cm/s    Systemic Diam: 2.00 cm MV Decel Time: 236 msec MV E velocity: 79.50 cm/s MV A velocity: 141.00 cm/s MV E/A ratio:  0.56 Lyman Bishop MD Electronically signed by Lyman Bishop MD Signature Date/Time: 04/06/2022/6:10:02 PM    Final    US Venous Img Lower Bilateral (DVT)  Result Date: 04/06/2022 CLINICAL DATA:  81 year old female with dyspnea for 2 weeks and swelling of the lower extremity EXAM: BILATERAL LOWER EXTREMITY VENOUS DOPPLER ULTRASOUND TECHNIQUE: Gray-scale sonography with graded compression, as well as color Doppler and duplex ultrasound were performed to evaluate the lower extremity deep venous systems from the level of the common femoral vein and including the common femoral, femoral, profunda femoral, popliteal and calf veins including the posterior tibial, peroneal and gastrocnemius veins when visible. The superficial great saphenous vein was also interrogated. Spectral Doppler was utilized to evaluate flow at rest and with distal augmentation maneuvers in the common femoral, femoral and popliteal veins. COMPARISON:  None Available. FINDINGS: RIGHT LOWER EXTREMITY Common Femoral Vein: No evidence of thrombus. Normal compressibility, respiratory phasicity and response to augmentation. Saphenofemoral Junction: No evidence of thrombus. Normal compressibility and flow on color Doppler imaging. Profunda Femoral Vein: No evidence of thrombus. Normal compressibility and flow on color Doppler imaging. Femoral Vein: No evidence of thrombus. Normal compressibility, respiratory phasicity and response to augmentation. Popliteal Vein: No evidence of thrombus. Normal compressibility, respiratory phasicity and response  to augmentation. Calf Veins: No evidence of thrombus. Normal compressibility and flow on color Doppler imaging. Superficial Great Saphenous Vein: No evidence of thrombus. Normal compressibility and flow on color Doppler imaging Other Findings:  None. LEFT LOWER EXTREMITY Common Femoral Vein: No evidence of thrombus. Normal compressibility, respiratory phasicity and response to augmentation. Saphenofemoral Junction: No evidence of thrombus. Normal compressibility and flow on color Doppler imaging. Profunda Femoral Vein: No evidence of thrombus. Normal compressibility and flow on color Doppler imaging. Femoral Vein: No evidence of thrombus. Normal compressibility, respiratory phasicity and response to augmentation. Popliteal Vein: No evidence of thrombus. Normal compressibility, respiratory phasicity and response to augmentation. Calf Veins: Occlusive thrombus of the posterior tibial vein, noncompressible. Peroneal vein patent and compressible with flow maintained. Anterior tibial vein patent. Superficial Great Saphenous Vein: No evidence of thrombus. Normal compressibility and flow on color Doppler imaging. Other Findings:  None. IMPRESSION: Directed duplex left lower extremity positive for distal DVT of the posterior tibial vein. Negative for proximal left DVT. Directed duplex right lower extremity negative for DVT. These results will be  called to the ordering clinician or representative by the Radiologist Assistant, and communication documented in the PACS or [employer]. Signed, Dulcy Fanny. Nadene Rubins, RPVI [facility] Radiology Electronically Signed   By: Corrie Mckusick D.O.   On: 04/06/2022 12:00   US Renal  Result Date: 04/06/2022 CLINICAL DATA:  Acute renal injury EXAM: RENAL / URINARY TRACT ULTRASOUND COMPLETE COMPARISON:  None Available. FINDINGS: Right Kidney: Renal measurements: 10.7 x 5.1 x 4.9 cm = volume: 167 mL. Two small (1.4 cm) anechoic  cysts. No hydronephrosis Left Kidney: Renal measurements: 11.4 x 7.1 x 5.6 cm = volume: 237 mL. Single small 2.3 cm anechoic cyst. No hydronephrosis. Bladder: Appears normal for degree of bladder distention. Other: None. IMPRESSION: 1. No hydronephrosis. 2. Bilateral benign-appearing renal cysts. 3. Normal bladder. Electronically Signed   By: Suzy Bouchard M.D.   On: 04/06/2022 10:39   DG Chest 2 View  Result Date: 04/05/2022 CLINICAL DATA:  Pt c/o sob and weakness for several days. Denies cp, fever or n/v. HX of hypertension and CA. EXAM: CHEST - 2 VIEW COMPARISON:  01/08/2020. FINDINGS: Mild enlargement of the cardiopericardial silhouette. No mediastinal or hilar masses. No evidence of adenopathy. Clear lungs.  No pleural effusion or pneumothorax. Skeletal structures are intact. IMPRESSION: No acute cardiopulmonary disease. Electronically Signed   By: Lajean Manes M.D.   On: 04/05/2022 14:04    Microbiology: Results for orders placed or performed during the hospital encounter of 10/06/18  Aerobic/Anaerobic Culture (surgical/deep wound)     Status: None   Collection Time: 10/06/18  6:13 PM   Specimen: Wound; Abscess  Result Value Ref Range Status   Specimen Description   Final    WOUND RIGHT HIP Performed at [facility] [address], [place], [place] [address]    Special Requests   Final    NONE Performed at [facility], [place] [address], [place], [place] [address]    Gram Stain   Final    RARE WBC PRESENT, PREDOMINANTLY MONONUCLEAR NO ORGANISMS SEEN    Culture   Final    No growth aerobically or anaerobically. Performed at [facility], [place] [address], [place], [place] [address]    Report Status 10/12/2018 FINAL  Final    Labs: CBC: Recent Labs  Lab 04/09/22 0531 04/10/22 0454 04/11/22 0440 04/12/22 0544 04/13/22 0451  WBC 7.0 8.6 9.8 11.5* 10.5  HGB 8.2* 8.7* 7.8* 8.4* 8.4*  HCT 25.9* 28.2* 24.4* 26.0* 26.5*  MCV 90.2  91.3 90.0 89.7 90.8  PLT 293 326 340 362 425   Basic Metabolic Panel: Recent Labs  Lab 04/09/22 0531 04/10/22 0454 04/11/22 0440 04/12/22 0544 04/13/22 0451  NA 134* 136 135 136 137  K 4.1 4.5 4.7 4.6 4.6  CL 106 105 105 105 106  CO2 '24 24 24 25 26  '$ GLUCOSE 110* 113* 102* 93 91  BUN 41* 48* 57* 59* 55*  CREATININE 3.14* 2.90* 2.86* 2.48* 2.43*  CALCIUM 9.5 9.9 9.8 9.7 9.9  PHOS 3.5 4.1 3.6 3.2 3.5   Liver Function Tests: Recent Labs  Lab 04/09/22 0531 04/10/22 0454 04/11/22 0440 04/12/22 0544 04/13/22 0451  ALBUMIN 2.7* 2.9* 2.6* 2.7* 2.7*   CBG: No results for input(s): "GLUCAP" in the last 168 hours.  Discharge time spent: greater than 30 minutes.  Signed: Orson Eva, MD [facility] 04/13/2022

## 2022-04-13 NOTE — Progress Notes (Signed)
ANTICOAGULATION CONSULT NOTE -  Pharmacy Consult for Heparin=> eliquis Indication:  LLE DVT 04/06/22  Allergies  Allergen Reactions   Ace Inhibitors Swelling   Sulfa Antibiotics Swelling and Rash   Patient Measurements: Height: '5\' 5"'$  (165.1 cm) Weight: 107.6 kg (237 lb 3.4 oz) IBW/kg (Calculated) : 57 Heparin Dosing Weight: 82.2 kg  Labs: Recent Labs    04/11/22 0440 04/12/22 0544 04/13/22 0451  HGB 7.8* 8.4* 8.4*  HCT 24.4* 26.0* 26.5*  PLT 340 362 370  HEPARINUNFRC 0.60 0.63 0.47  CREATININE 2.86* 2.48* 2.43*    Estimated Creatinine Clearance: 23.3 mL/min (A) (by C-G formula based on SCr of 2.43 mg/dL (H)).  Assessment: 81 year old W  found to have LLE DVT 04/06/22. PMH significant for anemia, breast cancer and obesity. No anticoagulation prior to admission. Pharmacy consulted for heparin.    Heparin level 0.47, therapeutic.  H&H stable, PLTs stable, no bleeding reported. (See above labs)  Now stable for transition to po tx with eliquis  Goal of Therapy:  Heparin level 0.3-0.7 units/ml Monitor platelets by anticoagulation protocol: Yes   Plan:  D/C heparin Eliquis '10mg'$  po bid x 7 days, then '5mg'$  po bid Educate on eliquis Monitor for signs/symptoms of bleeding   Isac Sarna, BS Pharm D, BCPS Clinical Pharmacist 04/13/2022 10:27 AM

## 2022-04-13 NOTE — Progress Notes (Signed)
[name] NEPHROLOGY PROGRESS NOTE  Assessment/ Plan: Pt is [name] 81 y.o. yo female with hypertension, breast cancer status postlumpectomy, radiation and currently on anastrozole, anemia presented with shortness of breath, seen in consultation for acute kidney injury.  #Acute kidney injury, nonoliguric : The creatinine level was first noted to be elevated to 1.9 in 01/2022, then presented with creatinine level of 3.16 on admission associated with proteinuria (UPC 3.3) and microscopic hematuria. The kidney ultrasound unremarkable except.  The GN work-up positive for ANA, anti-dsDNA antibody P-ANCA and antihistone antibody.  Anti-GBM, C3, C4 negative.  Protein electrophoresis with no M spike.  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  Antihistone is positive.  We discontinued hydralazine immediately  and started empiric prednisone on 6/14.  The patient underwent native kidney biopsy by IR on 6/15; results should be back today ?    Continue prednisone and she will possibly need immunosuppression like rituximab versus Cytoxan pending the biopsy results.  I reviewed this plan with the patient today and she agreed with it.  Hepatitis panel negative.  Fortunately, renal function is improving.  Continue to monitor.  Strict ins and out, daily lab.  # Anemia, ? related with vasculitis: Iron saturation 33%, Aranesp 60 on 6/16.  No sign of bleeding.  # HTN/volume: Blood pressure elevated.  Amlodipine just incr to 10 mg.  Off of hydralazine as discussed above.  No ACE inhibitor or ARB because of AKI.lopressor started over the weekend-  prednisone is not helping.  I will add on some lasix today as well  #Left leg DVT: Currently on heparin.  She has [name] history of breast cancer.  Dispo-  I would not have [name] problem with her being discharged.  I am leaning toward maybe no more IS meds.  If goes home would like labs ( renal, cbc)   on Thursday or Friday and then I can arrange  her follow up in our [place] office on Monday    Subjective:  only 300 of UOP recorded -   overall crt down-  BP is high -  feels better   Objective Vital signs in last 24 hours: Vitals:   04/12/22 1335 04/12/22 2123 04/13/22 0603 04/13/22 0800  BP: (!) 148/55 (!) 162/54 (!) 179/61 (!) 180/90  Pulse: (!) 59 64 (!) 59 60  Resp: '18 19 20 16  '$ Temp: 98.3 F (36.8 C) 97.7 F (36.5 C) 98.1 F (36.7 C) 97.7 F (36.5 C)  TempSrc: Oral Oral Oral Oral  SpO2: 96% 97% 97%   Weight:      Height:       Weight change:   Intake/Output Summary (Last 24 hours) at 04/13/2022 0814 Last data filed at 04/13/2022 0744 Gross per 24 hour  Intake 1373.17 ml  Output 0 ml  Net 1373.17 ml       Labs: RENAL PANEL Recent Labs    04/05/22 1317 04/06/22 0444 04/07/22 0514 04/08/22 0452 04/09/22 0531 04/10/22 0454 04/11/22 0440 04/12/22 0544 04/13/22 0451  NA 139 139 139 136 134* 136 135 136 137  K 4.0 4.1 3.8 3.8 4.1 4.5 4.7 4.6 4.6  CL 106 111 110 107 106 105 105 105 106  CO'2 26 27 24 24 24 24 24 25 26 '$  GLUCOSE 103* 102* 100* 106* 110* 113* 102* 93 91  BUN 40* 34* 32* 31* 41* 48* 57* 59* 55*  CREATININE 3.16* 2.99* 2.75* 2.79* 3.14* 2.90* 2.86* 2.48* 2.43*  CALCIUM 9.9 9.2 9.1 9.2  9.5 9.9 9.8 9.7 9.9  PHOS  --   --  3.2 3.0 3.5 4.1 3.6 3.2 3.5  ALBUMIN  --   --  2.7* 2.6* 2.7* 2.9* 2.6* 2.7* 2.7*     Liver Function Tests: Recent Labs  Lab 04/11/22 0440 04/12/22 0544 04/13/22 0451  ALBUMIN 2.6* 2.7* 2.7*   No results for input(s): "LIPASE", "AMYLASE" in the last 168 hours. No results for input(s): "AMMONIA" in the last 168 hours. CBC: Recent Labs    04/09/22 0531 04/10/22 0454 04/11/22 0440 04/12/22 0544 04/13/22 0451  HGB 8.2* 8.7* 7.8* 8.4* 8.4*  MCV 90.2 91.3 90.0 89.7 90.8  FERRITIN 79  --   --   --   --   TIBC 239*  --   --   --   --   IRON 79  --   --   --   --     Cardiac Enzymes: No results for input(s): "CKTOTAL", "CKMB", "CKMBINDEX", "TROPONINI" in the  last 168 hours. CBG: No results for input(s): "GLUCAP" in the last 168 hours.  Iron Studies: No results for input(s): "IRON", "TIBC", "TRANSFERRIN", "FERRITIN" in the last 72 hours.  Studies/Results: No results found.  Medications: Infusions:  heparin 1,100 Units/hr (04/13/22 0093)    Scheduled Medications:  amLODipine  10 mg Oral Daily   anastrozole  1 mg Oral Daily   brimonidine  1 drop Both Eyes BID   And   timolol  1 drop Both Eyes BID   metoprolol tartrate  12.5 mg Oral BID   pantoprazole  40 mg Oral Daily   predniSONE  60 mg Oral Daily    have reviewed scheduled and prn medications.  Physical Exam: General: Lying on bed comfortable, not in distress Heart:RRR, s1s2 nl Lungs: Clear b/l, no rales Abdomen:soft, Non-tender, non-distended Extremities: Trace peripheral pitting edema present. Neurology: Alert, awake and following commands.  [name] [name] [name] [name] 04/13/2022,8:14 AM  LOS: 7 days

## 2022-04-14 DIAGNOSIS — I82402 Acute embolism and thrombosis of unspecified deep veins of left lower extremity: Secondary | ICD-10-CM | POA: Diagnosis not present

## 2022-04-14 DIAGNOSIS — C50412 Malignant neoplasm of upper-outer quadrant of left female breast: Secondary | ICD-10-CM | POA: Diagnosis not present

## 2022-04-14 DIAGNOSIS — K219 Gastro-esophageal reflux disease without esophagitis: Secondary | ICD-10-CM | POA: Diagnosis not present

## 2022-04-14 DIAGNOSIS — E213 Hyperparathyroidism, unspecified: Secondary | ICD-10-CM | POA: Diagnosis not present

## 2022-04-14 DIAGNOSIS — M1389 Other specified arthritis, multiple sites: Secondary | ICD-10-CM | POA: Diagnosis not present

## 2022-04-14 DIAGNOSIS — D509 Iron deficiency anemia, unspecified: Secondary | ICD-10-CM | POA: Diagnosis not present

## 2022-04-14 DIAGNOSIS — I10 Essential (primary) hypertension: Secondary | ICD-10-CM | POA: Diagnosis not present

## 2022-04-16 ENCOUNTER — Encounter (HOSPITAL_COMMUNITY): Payer: Self-pay

## 2022-04-16 ENCOUNTER — Other Ambulatory Visit (HOSPITAL_COMMUNITY)
Admission: RE | Admit: 2022-04-16 | Discharge: 2022-04-16 | Disposition: A | Discharge status: Home / Self Care | Payer: Medicare Other | Source: Ambulatory Visit | Attending: Internal Medicine | Admitting: Internal Medicine

## 2022-04-16 DIAGNOSIS — N179 Acute kidney failure, unspecified: Secondary | ICD-10-CM | POA: Diagnosis not present

## 2022-04-16 DIAGNOSIS — I82402 Acute embolism and thrombosis of unspecified deep veins of left lower extremity: Secondary | ICD-10-CM | POA: Diagnosis not present

## 2022-04-16 LAB — RENAL FUNCTION PANEL
Albumin: 3.3 g/dL — ABNORMAL LOW (ref 3.5–5.0)
Anion gap: 8 (ref 5–15)
BUN: 80 mg/dL — ABNORMAL HIGH (ref 8–23)
CO2: 26 mmol/L (ref 22–32)
Calcium: 10 mg/dL (ref 8.9–10.3)
Chloride: 101 mmol/L (ref 98–111)
Creatinine, Ser: 2.97 mg/dL — ABNORMAL HIGH (ref 0.44–1.00)
GFR, Estimated: 16 mL/min — ABNORMAL LOW (ref >60)
Glucose, Bld: 111 mg/dL — ABNORMAL HIGH (ref 70–99)
Phosphorus: 3.5 mg/dL (ref 2.5–4.6)
Potassium: 4 mmol/L (ref 3.5–5.1)
Sodium: 135 mmol/L (ref 135–145)

## 2022-04-16 LAB — CBC
HCT: 30.7 % — ABNORMAL LOW (ref 36.0–46.0)
Hemoglobin: 9.9 g/dL — ABNORMAL LOW (ref 12.0–15.0)
MCH: 29.6 pg (ref 26.0–34.0)
MCHC: 32.2 g/dL (ref 30.0–36.0)
MCV: 91.6 fL (ref 80.0–100.0)
Platelets: 381 10*3/uL (ref 150–400)
RBC: 3.35 MIL/uL — ABNORMAL LOW (ref 3.87–5.11)
RDW: 15.3 % (ref 11.5–15.5)
WBC: 12.1 10*3/uL — ABNORMAL HIGH (ref 4.0–10.5)
nRBC: 0 % (ref 0.0–0.2)

## 2022-04-17 DIAGNOSIS — K219 Gastro-esophageal reflux disease without esophagitis: Secondary | ICD-10-CM | POA: Diagnosis not present

## 2022-04-17 DIAGNOSIS — M1389 Other specified arthritis, multiple sites: Secondary | ICD-10-CM | POA: Diagnosis not present

## 2022-04-17 DIAGNOSIS — E213 Hyperparathyroidism, unspecified: Secondary | ICD-10-CM | POA: Diagnosis not present

## 2022-04-17 DIAGNOSIS — I82402 Acute embolism and thrombosis of unspecified deep veins of left lower extremity: Secondary | ICD-10-CM | POA: Diagnosis not present

## 2022-04-17 DIAGNOSIS — C50412 Malignant neoplasm of upper-outer quadrant of left female breast: Secondary | ICD-10-CM | POA: Diagnosis not present

## 2022-04-17 DIAGNOSIS — I10 Essential (primary) hypertension: Secondary | ICD-10-CM | POA: Diagnosis not present

## 2022-04-17 DIAGNOSIS — D509 Iron deficiency anemia, unspecified: Secondary | ICD-10-CM | POA: Diagnosis not present

## 2022-04-20 DIAGNOSIS — N058 Unspecified nephritic syndrome with other morphologic changes: Secondary | ICD-10-CM | POA: Diagnosis not present

## 2022-04-20 DIAGNOSIS — N057 Unspecified nephritic syndrome with diffuse crescentic glomerulonephritis: Secondary | ICD-10-CM | POA: Diagnosis not present

## 2022-04-20 DIAGNOSIS — D631 Anemia in chronic kidney disease: Secondary | ICD-10-CM | POA: Diagnosis not present

## 2022-04-20 DIAGNOSIS — N189 Chronic kidney disease, unspecified: Secondary | ICD-10-CM | POA: Diagnosis not present

## 2022-04-20 DIAGNOSIS — I129 Hypertensive chronic kidney disease with stage 1 through stage 4 chronic kidney disease, or unspecified chronic kidney disease: Secondary | ICD-10-CM | POA: Diagnosis not present

## 2022-04-20 DIAGNOSIS — N179 Acute kidney failure, unspecified: Secondary | ICD-10-CM | POA: Diagnosis not present

## 2022-04-21 DIAGNOSIS — N058 Unspecified nephritic syndrome with other morphologic changes: Secondary | ICD-10-CM | POA: Diagnosis not present

## 2022-04-21 DIAGNOSIS — I82402 Acute embolism and thrombosis of unspecified deep veins of left lower extremity: Secondary | ICD-10-CM | POA: Diagnosis not present

## 2022-04-21 DIAGNOSIS — I10 Essential (primary) hypertension: Secondary | ICD-10-CM | POA: Diagnosis not present

## 2022-04-21 DIAGNOSIS — D509 Iron deficiency anemia, unspecified: Secondary | ICD-10-CM | POA: Diagnosis not present

## 2022-04-21 DIAGNOSIS — C50412 Malignant neoplasm of upper-outer quadrant of left female breast: Secondary | ICD-10-CM | POA: Diagnosis not present

## 2022-04-21 DIAGNOSIS — M1389 Other specified arthritis, multiple sites: Secondary | ICD-10-CM | POA: Diagnosis not present

## 2022-04-21 DIAGNOSIS — K219 Gastro-esophageal reflux disease without esophagitis: Secondary | ICD-10-CM | POA: Diagnosis not present

## 2022-04-21 DIAGNOSIS — E213 Hyperparathyroidism, unspecified: Secondary | ICD-10-CM | POA: Diagnosis not present

## 2022-04-21 LAB — SURGICAL PATHOLOGY

## 2022-04-22 DIAGNOSIS — C50412 Malignant neoplasm of upper-outer quadrant of left female breast: Secondary | ICD-10-CM | POA: Diagnosis not present

## 2022-04-22 DIAGNOSIS — I82402 Acute embolism and thrombosis of unspecified deep veins of left lower extremity: Secondary | ICD-10-CM | POA: Diagnosis not present

## 2022-04-22 DIAGNOSIS — E213 Hyperparathyroidism, unspecified: Secondary | ICD-10-CM | POA: Diagnosis not present

## 2022-04-22 DIAGNOSIS — I10 Essential (primary) hypertension: Secondary | ICD-10-CM | POA: Diagnosis not present

## 2022-04-23 DIAGNOSIS — K219 Gastro-esophageal reflux disease without esophagitis: Secondary | ICD-10-CM | POA: Diagnosis not present

## 2022-04-23 DIAGNOSIS — C50412 Malignant neoplasm of upper-outer quadrant of left female breast: Secondary | ICD-10-CM | POA: Diagnosis not present

## 2022-04-23 DIAGNOSIS — E213 Hyperparathyroidism, unspecified: Secondary | ICD-10-CM | POA: Diagnosis not present

## 2022-04-23 DIAGNOSIS — I10 Essential (primary) hypertension: Secondary | ICD-10-CM | POA: Diagnosis not present

## 2022-04-23 DIAGNOSIS — I82402 Acute embolism and thrombosis of unspecified deep veins of left lower extremity: Secondary | ICD-10-CM | POA: Diagnosis not present

## 2022-04-23 DIAGNOSIS — D509 Iron deficiency anemia, unspecified: Secondary | ICD-10-CM | POA: Diagnosis not present

## 2022-04-23 DIAGNOSIS — M1389 Other specified arthritis, multiple sites: Secondary | ICD-10-CM | POA: Diagnosis not present

## 2022-04-24 DIAGNOSIS — K219 Gastro-esophageal reflux disease without esophagitis: Secondary | ICD-10-CM | POA: Diagnosis not present

## 2022-04-24 DIAGNOSIS — Z299 Encounter for prophylactic measures, unspecified: Secondary | ICD-10-CM | POA: Diagnosis not present

## 2022-04-24 DIAGNOSIS — I10 Essential (primary) hypertension: Secondary | ICD-10-CM | POA: Diagnosis not present

## 2022-04-24 DIAGNOSIS — N047 Nephrotic syndrome with diffuse crescentic glomerulonephritis: Secondary | ICD-10-CM | POA: Diagnosis not present

## 2022-04-27 DIAGNOSIS — D631 Anemia in chronic kidney disease: Secondary | ICD-10-CM | POA: Diagnosis not present

## 2022-04-27 DIAGNOSIS — N179 Acute kidney failure, unspecified: Secondary | ICD-10-CM | POA: Diagnosis not present

## 2022-04-27 DIAGNOSIS — Z79899 Other long term (current) drug therapy: Secondary | ICD-10-CM | POA: Diagnosis not present

## 2022-04-27 DIAGNOSIS — K219 Gastro-esophageal reflux disease without esophagitis: Secondary | ICD-10-CM | POA: Diagnosis not present

## 2022-04-27 DIAGNOSIS — D509 Iron deficiency anemia, unspecified: Secondary | ICD-10-CM | POA: Diagnosis not present

## 2022-04-27 DIAGNOSIS — D849 Immunodeficiency, unspecified: Secondary | ICD-10-CM | POA: Diagnosis not present

## 2022-04-27 DIAGNOSIS — C50412 Malignant neoplasm of upper-outer quadrant of left female breast: Secondary | ICD-10-CM | POA: Diagnosis not present

## 2022-04-27 DIAGNOSIS — M1389 Other specified arthritis, multiple sites: Secondary | ICD-10-CM | POA: Diagnosis not present

## 2022-04-27 DIAGNOSIS — N057 Unspecified nephritic syndrome with diffuse crescentic glomerulonephritis: Secondary | ICD-10-CM | POA: Diagnosis not present

## 2022-04-27 DIAGNOSIS — E559 Vitamin D deficiency, unspecified: Secondary | ICD-10-CM | POA: Diagnosis not present

## 2022-04-27 DIAGNOSIS — I10 Essential (primary) hypertension: Secondary | ICD-10-CM | POA: Diagnosis not present

## 2022-04-27 DIAGNOSIS — I82402 Acute embolism and thrombosis of unspecified deep veins of left lower extremity: Secondary | ICD-10-CM | POA: Diagnosis not present

## 2022-04-27 DIAGNOSIS — E213 Hyperparathyroidism, unspecified: Secondary | ICD-10-CM | POA: Diagnosis not present

## 2022-04-27 DIAGNOSIS — N058 Unspecified nephritic syndrome with other morphologic changes: Secondary | ICD-10-CM | POA: Diagnosis not present

## 2022-04-27 DIAGNOSIS — I129 Hypertensive chronic kidney disease with stage 1 through stage 4 chronic kidney disease, or unspecified chronic kidney disease: Secondary | ICD-10-CM | POA: Diagnosis not present

## 2022-04-27 DIAGNOSIS — N189 Chronic kidney disease, unspecified: Secondary | ICD-10-CM | POA: Diagnosis not present

## 2022-05-07 DIAGNOSIS — I10 Essential (primary) hypertension: Secondary | ICD-10-CM | POA: Diagnosis not present

## 2022-05-07 DIAGNOSIS — M1389 Other specified arthritis, multiple sites: Secondary | ICD-10-CM | POA: Diagnosis not present

## 2022-05-07 DIAGNOSIS — E213 Hyperparathyroidism, unspecified: Secondary | ICD-10-CM | POA: Diagnosis not present

## 2022-05-07 DIAGNOSIS — K219 Gastro-esophageal reflux disease without esophagitis: Secondary | ICD-10-CM | POA: Diagnosis not present

## 2022-05-07 DIAGNOSIS — C50412 Malignant neoplasm of upper-outer quadrant of left female breast: Secondary | ICD-10-CM | POA: Diagnosis not present

## 2022-05-07 DIAGNOSIS — I82402 Acute embolism and thrombosis of unspecified deep veins of left lower extremity: Secondary | ICD-10-CM | POA: Diagnosis not present

## 2022-05-07 DIAGNOSIS — D509 Iron deficiency anemia, unspecified: Secondary | ICD-10-CM | POA: Diagnosis not present

## 2022-05-11 DIAGNOSIS — N179 Acute kidney failure, unspecified: Secondary | ICD-10-CM | POA: Diagnosis not present

## 2022-05-11 DIAGNOSIS — Z79899 Other long term (current) drug therapy: Secondary | ICD-10-CM | POA: Diagnosis not present

## 2022-05-12 DIAGNOSIS — N179 Acute kidney failure, unspecified: Secondary | ICD-10-CM | POA: Diagnosis not present

## 2022-05-12 DIAGNOSIS — N390 Urinary tract infection, site not specified: Secondary | ICD-10-CM | POA: Diagnosis not present

## 2022-05-18 DIAGNOSIS — N057 Unspecified nephritic syndrome with diffuse crescentic glomerulonephritis: Secondary | ICD-10-CM | POA: Diagnosis not present

## 2022-05-18 DIAGNOSIS — N189 Chronic kidney disease, unspecified: Secondary | ICD-10-CM | POA: Diagnosis not present

## 2022-05-18 DIAGNOSIS — D849 Immunodeficiency, unspecified: Secondary | ICD-10-CM | POA: Diagnosis not present

## 2022-05-18 DIAGNOSIS — N179 Acute kidney failure, unspecified: Secondary | ICD-10-CM | POA: Diagnosis not present

## 2022-05-18 DIAGNOSIS — R5383 Other fatigue: Secondary | ICD-10-CM | POA: Diagnosis not present

## 2022-05-18 DIAGNOSIS — I129 Hypertensive chronic kidney disease with stage 1 through stage 4 chronic kidney disease, or unspecified chronic kidney disease: Secondary | ICD-10-CM | POA: Diagnosis not present

## 2022-05-18 DIAGNOSIS — D631 Anemia in chronic kidney disease: Secondary | ICD-10-CM | POA: Diagnosis not present

## 2022-05-18 DIAGNOSIS — N058 Unspecified nephritic syndrome with other morphologic changes: Secondary | ICD-10-CM | POA: Diagnosis not present

## 2022-05-18 DIAGNOSIS — Z79899 Other long term (current) drug therapy: Secondary | ICD-10-CM | POA: Diagnosis not present

## 2022-05-21 DIAGNOSIS — H40053 Ocular hypertension, bilateral: Secondary | ICD-10-CM | POA: Diagnosis not present

## 2022-05-21 DIAGNOSIS — H401431 Capsular glaucoma with pseudoexfoliation of lens, bilateral, mild stage: Secondary | ICD-10-CM | POA: Diagnosis not present

## 2022-05-21 DIAGNOSIS — H18413 Arcus senilis, bilateral: Secondary | ICD-10-CM | POA: Diagnosis not present

## 2022-05-21 DIAGNOSIS — H01001 Unspecified blepharitis right upper eyelid: Secondary | ICD-10-CM | POA: Diagnosis not present

## 2022-05-22 DIAGNOSIS — Z299 Encounter for prophylactic measures, unspecified: Secondary | ICD-10-CM | POA: Diagnosis not present

## 2022-05-22 DIAGNOSIS — I700 Atherosclerosis of aorta: Secondary | ICD-10-CM | POA: Diagnosis not present

## 2022-05-22 DIAGNOSIS — I10 Essential (primary) hypertension: Secondary | ICD-10-CM | POA: Diagnosis not present

## 2022-05-22 DIAGNOSIS — E210 Primary hyperparathyroidism: Secondary | ICD-10-CM | POA: Diagnosis not present

## 2022-05-27 DIAGNOSIS — N179 Acute kidney failure, unspecified: Secondary | ICD-10-CM | POA: Diagnosis not present

## 2022-05-29 DIAGNOSIS — R109 Unspecified abdominal pain: Secondary | ICD-10-CM | POA: Diagnosis not present

## 2022-05-29 DIAGNOSIS — N045 Nephrotic syndrome with diffuse mesangiocapillary glomerulonephritis: Secondary | ICD-10-CM | POA: Diagnosis not present

## 2022-05-29 DIAGNOSIS — I10 Essential (primary) hypertension: Secondary | ICD-10-CM | POA: Diagnosis not present

## 2022-05-29 DIAGNOSIS — Z299 Encounter for prophylactic measures, unspecified: Secondary | ICD-10-CM | POA: Diagnosis not present

## 2022-05-29 DIAGNOSIS — R609 Edema, unspecified: Secondary | ICD-10-CM | POA: Diagnosis not present

## 2022-06-02 ENCOUNTER — Emergency Department (HOSPITAL_COMMUNITY): Payer: Medicare Other

## 2022-06-02 ENCOUNTER — Other Ambulatory Visit: Payer: Self-pay

## 2022-06-02 ENCOUNTER — Encounter (HOSPITAL_COMMUNITY): Payer: Self-pay

## 2022-06-02 ENCOUNTER — Inpatient Hospital Stay (HOSPITAL_COMMUNITY)
Admission: EM | Admit: 2022-06-02 | Discharge: 2022-06-05 | DRG: 689 | Disposition: A | Discharge status: Home Health | Payer: Medicare Other | Attending: Internal Medicine | Admitting: Internal Medicine

## 2022-06-02 DIAGNOSIS — N009 Acute nephritic syndrome with unspecified morphologic changes: Secondary | ICD-10-CM | POA: Diagnosis not present

## 2022-06-02 DIAGNOSIS — F419 Anxiety disorder, unspecified: Secondary | ICD-10-CM | POA: Diagnosis present

## 2022-06-02 DIAGNOSIS — E669 Obesity, unspecified: Secondary | ICD-10-CM | POA: Diagnosis present

## 2022-06-02 DIAGNOSIS — C50419 Malignant neoplasm of upper-outer quadrant of unspecified female breast: Secondary | ICD-10-CM | POA: Diagnosis not present

## 2022-06-02 DIAGNOSIS — Z87891 Personal history of nicotine dependence: Secondary | ICD-10-CM | POA: Diagnosis not present

## 2022-06-02 DIAGNOSIS — G9341 Metabolic encephalopathy: Secondary | ICD-10-CM | POA: Diagnosis not present

## 2022-06-02 DIAGNOSIS — Z888 Allergy status to other drugs, medicaments and biological substances status: Secondary | ICD-10-CM | POA: Diagnosis not present

## 2022-06-02 DIAGNOSIS — Z86718 Personal history of other venous thrombosis and embolism: Secondary | ICD-10-CM | POA: Diagnosis not present

## 2022-06-02 DIAGNOSIS — N3000 Acute cystitis without hematuria: Secondary | ICD-10-CM | POA: Diagnosis not present

## 2022-06-02 DIAGNOSIS — R4182 Altered mental status, unspecified: Principal | ICD-10-CM

## 2022-06-02 DIAGNOSIS — Z96653 Presence of artificial knee joint, bilateral: Secondary | ICD-10-CM | POA: Diagnosis not present

## 2022-06-02 DIAGNOSIS — Z87448 Personal history of other diseases of urinary system: Secondary | ICD-10-CM | POA: Diagnosis not present

## 2022-06-02 DIAGNOSIS — Z6836 Body mass index (BMI) 36.0-36.9, adult: Secondary | ICD-10-CM

## 2022-06-02 DIAGNOSIS — Z923 Personal history of irradiation: Secondary | ICD-10-CM

## 2022-06-02 DIAGNOSIS — R410 Disorientation, unspecified: Secondary | ICD-10-CM | POA: Diagnosis not present

## 2022-06-02 DIAGNOSIS — K219 Gastro-esophageal reflux disease without esophagitis: Secondary | ICD-10-CM | POA: Diagnosis present

## 2022-06-02 DIAGNOSIS — N390 Urinary tract infection, site not specified: Secondary | ICD-10-CM | POA: Diagnosis not present

## 2022-06-02 DIAGNOSIS — E213 Hyperparathyroidism, unspecified: Secondary | ICD-10-CM | POA: Diagnosis present

## 2022-06-02 DIAGNOSIS — Z7952 Long term (current) use of systemic steroids: Secondary | ICD-10-CM | POA: Diagnosis not present

## 2022-06-02 DIAGNOSIS — E876 Hypokalemia: Secondary | ICD-10-CM | POA: Diagnosis not present

## 2022-06-02 DIAGNOSIS — Z7901 Long term (current) use of anticoagulants: Secondary | ICD-10-CM

## 2022-06-02 DIAGNOSIS — Z882 Allergy status to sulfonamides status: Secondary | ICD-10-CM | POA: Diagnosis not present

## 2022-06-02 DIAGNOSIS — I82402 Acute embolism and thrombosis of unspecified deep veins of left lower extremity: Secondary | ICD-10-CM | POA: Diagnosis present

## 2022-06-02 DIAGNOSIS — E559 Vitamin D deficiency, unspecified: Secondary | ICD-10-CM | POA: Diagnosis not present

## 2022-06-02 DIAGNOSIS — I10 Essential (primary) hypertension: Secondary | ICD-10-CM | POA: Diagnosis not present

## 2022-06-02 DIAGNOSIS — M1990 Unspecified osteoarthritis, unspecified site: Secondary | ICD-10-CM | POA: Diagnosis not present

## 2022-06-02 DIAGNOSIS — R531 Weakness: Secondary | ICD-10-CM | POA: Diagnosis not present

## 2022-06-02 DIAGNOSIS — Z79899 Other long term (current) drug therapy: Secondary | ICD-10-CM

## 2022-06-02 DIAGNOSIS — Z96641 Presence of right artificial hip joint: Secondary | ICD-10-CM | POA: Diagnosis not present

## 2022-06-02 LAB — URINALYSIS, ROUTINE W REFLEX MICROSCOPIC
Bacteria, UA: NONE SEEN
Bilirubin Urine: NEGATIVE
Glucose, UA: NEGATIVE mg/dL
Ketones, ur: NEGATIVE mg/dL
Nitrite: NEGATIVE
Protein, ur: 100 mg/dL — AB
Specific Gravity, Urine: 1.012 (ref 1.005–1.030)
pH: 5 (ref 5.0–8.0)

## 2022-06-02 LAB — CBC WITH DIFFERENTIAL/PLATELET
Abs Immature Granulocytes: 0.1 10*3/uL — ABNORMAL HIGH (ref 0.00–0.07)
Basophils Absolute: 0 10*3/uL (ref 0.0–0.1)
Basophils Relative: 0 %
Eosinophils Absolute: 0 10*3/uL (ref 0.0–0.5)
Eosinophils Relative: 0 %
HCT: 39.5 % (ref 36.0–46.0)
Hemoglobin: 13.1 g/dL (ref 12.0–15.0)
Immature Granulocytes: 1 %
Lymphocytes Relative: 11 %
Lymphs Abs: 0.9 10*3/uL (ref 0.7–4.0)
MCH: 30.5 pg (ref 26.0–34.0)
MCHC: 33.2 g/dL (ref 30.0–36.0)
MCV: 91.9 fL (ref 80.0–100.0)
Monocytes Absolute: 0.4 10*3/uL (ref 0.1–1.0)
Monocytes Relative: 6 %
Neutro Abs: 6.2 10*3/uL (ref 1.7–7.7)
Neutrophils Relative %: 82 %
Platelets: 347 10*3/uL (ref 150–400)
RBC: 4.3 MIL/uL (ref 3.87–5.11)
RDW: 14.5 % (ref 11.5–15.5)
WBC: 7.6 10*3/uL (ref 4.0–10.5)
nRBC: 0 % (ref 0.0–0.2)

## 2022-06-02 LAB — COMPREHENSIVE METABOLIC PANEL
ALT: 24 U/L (ref 0–44)
AST: 24 U/L (ref 15–41)
Albumin: 3.5 g/dL (ref 3.5–5.0)
Alkaline Phosphatase: 71 U/L (ref 38–126)
Anion gap: 12 (ref 5–15)
BUN: 57 mg/dL — ABNORMAL HIGH (ref 8–23)
CO2: 28 mmol/L (ref 22–32)
Calcium: 10.1 mg/dL (ref 8.9–10.3)
Chloride: 96 mmol/L — ABNORMAL LOW (ref 98–111)
Creatinine, Ser: 2.31 mg/dL — ABNORMAL HIGH (ref 0.44–1.00)
GFR, Estimated: 21 mL/min — ABNORMAL LOW (ref >60)
Glucose, Bld: 131 mg/dL — ABNORMAL HIGH (ref 70–99)
Potassium: 4 mmol/L (ref 3.5–5.1)
Sodium: 136 mmol/L (ref 135–145)
Total Bilirubin: 1 mg/dL (ref 0.3–1.2)
Total Protein: 7.2 g/dL (ref 6.5–8.1)

## 2022-06-02 MED ORDER — SODIUM CHLORIDE 0.9 % IV SOLN
1.0000 g | INTRAVENOUS | Status: AC
  Administered 2022-06-02: 1 g via INTRAVENOUS
  Filled 2022-06-02: qty 10

## 2022-06-02 NOTE — ED Triage Notes (Signed)
Patient's daughter reports that the patient has had AMS since May 27, 2022.  Patient was taken to her PCP on 05/29/22 for blood work and Metoprolol dosage was reduced. On 8/5  her kidney doctor stopped her Isosorbide.  Today, the daughter states that she could not remember how to take a bath or put her clothes on.

## 2022-06-02 NOTE — ED Provider Triage Note (Signed)
Emergency Medicine Provider Triage Evaluation Note  KAYLIA WINBORNE , a 81 y.o. female  was evaluated in triage.  Pt complains of weakness and confusion since August 2.   Review of Systems  Positive: General weakness Negative: fever  Physical Exam  BP (!) 186/99 (BP Location: Right Arm)   Pulse 80   Temp 99 F (37.2 C) (Oral)   Resp 16   Ht 5' 3.5" (1.613 m)   Wt 95.3 kg   SpO2 95%   BMI 36.62 kg/m  Gen:   Awake, no distress   Resp:  Normal effort  MSK:   Moves extremities without difficulty  Other:    Medical Decision Making  Medically screening exam initiated at 6:37 PM.  Appropriate orders placed.  [name] was informed that the remainder of the evaluation will be completed by another provider, this initial triage assessment does not replace that evaluation, and the importance of remaining in the ED until their evaluation is complete.     Fransico Meadow, [place] 06/02/22 Bosie Helper

## 2022-06-02 NOTE — ED Provider Notes (Signed)
[facility] DEPT Provider Note   CSN: [id number] Arrival date & time: 06/02/22  1741     History {Add pertinent medical, surgical, social history, OB history to HPI:1} Chief Complaint  Patient presents with   Weakness   AMS    [name] [name] is a 81 y.o. female.  81 year old female with history of glomerulonephritis on prednisone, DVT on Xarelto, hyperparathyroidism, and hypertension who was presented to the emergency department with altered mental status.  Per the patient's daughter she is typically alert and oriented x 3.  She carries out all of her own ADLs including paying her own bills and driving.  Says that she was recently hospitalized for glomerulonephritis and was found to have blood clots and was started on steroids.  After being discharged she had been doing well until 05/27/2022 when she started to seem confused.  Initially was confused only with small tasks but says that it persisted so she went to her primary care doctor on Friday who adjusted her metoprolol and HCTZ.  Says that over the weekend her mental status continued to worsen and now is unable to recall any events during the day.  No focal numbness or weakness.  No recent illnesses including fevers, chills, nausea, vomiting, cough, runny nose or sore throat, diarrhea.  No other medication changes.  Is on 20 mg of prednisone daily for her glomerulonephritis which she has been on since she was discharged on 04/13/22.  Lives at home with her and her daughter.  No home health aides.   Weakness      Home Medications Prior to Admission medications   Medication Sig Start Date End Date Taking? Authorizing Provider  amLODipine (NORVASC) 10 MG tablet Take 10 mg by mouth daily. 02/26/22   [provider]  anastrozole (ARIMIDEX) 1 MG tablet Take 1 mg by mouth daily. 02/24/22   [provider]  apixaban (ELIQUIS) 5 MG TABS tablet Take 2 tablets (10 mg total) by mouth 2 (two) times  daily. X 7 days, then one tab (5 mg) bid starting 04/20/22 04/13/22   Orson Eva, MD  cetirizine (ZYRTEC) 10 MG tablet Take 10 mg by mouth every morning.     [provider]  Cholecalciferol 25 MCG (1000 UT) tablet Take by mouth.    [provider]  COMBIGAN 0.2-0.5 % ophthalmic solution Place 1 drop into both eyes every 12 (twelve) hours. 05/22/20   [provider]  famotidine (PEPCID) 10 MG tablet Take 10 mg by mouth 2 (two) times daily.    [provider]  furosemide (LASIX) 40 MG tablet Take 1 tablet (40 mg total) by mouth daily. 04/14/22   Orson Eva, MD  metoprolol tartrate (LOPRESSOR) 25 MG tablet Take 0.5 tablets (12.5 mg total) by mouth 2 (two) times daily. 04/13/22   Orson Eva, MD  predniSONE (DELTASONE) 20 MG tablet Take 3 tablets (60 mg total) by mouth daily. 04/14/22   Orson Eva, MD  ROCKLATAN 0.02-0.005 % SOLN Apply 1 drop to eye at bedtime. 02/23/22   [provider]      Allergies    Ace inhibitors and Sulfa antibiotics    Review of Systems   Review of Systems  Neurological:  Positive for weakness.    Physical Exam Updated Vital Signs BP (!) 173/89 (BP Location: Right Arm)   Pulse 83   Temp 98.4 F (36.9 C) (Oral)   Resp 17   Ht 5' 3.5" (1.613 m)   Wt 95.3 kg  SpO2 96%   BMI 36.62 kg/m  Physical Exam  ED Results / Procedures / Treatments   Labs (all labs ordered are listed, but only abnormal results are displayed) Labs Reviewed  CBC WITH DIFFERENTIAL/PLATELET - Abnormal; Notable for the following components:      Result Value   Abs Immature Granulocytes 0.10 (*)    All other components within normal limits  COMPREHENSIVE METABOLIC PANEL - Abnormal; Notable for the following components:   Chloride 96 (*)    Glucose, Bld 131 (*)    BUN 57 (*)    Creatinine, Ser 2.31 (*)    GFR, Estimated 21 (*)    All other components within normal limits  URINALYSIS, ROUTINE W REFLEX MICROSCOPIC - Abnormal; Notable for the  following components:   APPearance HAZY (*)    Hgb urine dipstick MODERATE (*)    Protein, ur 100 (*)    Leukocytes,Ua MODERATE (*)    All other components within normal limits    EKG None  Radiology DG Chest 2 View  Result Date: 06/02/2022 CLINICAL DATA:  Mental status changes.  Weakness. EXAM: CHEST - 2 VIEW COMPARISON:  04/05/2022 FINDINGS: Lateral view degraded by patient arm position. Midline trachea. Borderline cardiomegaly. Tortuous thoracic aorta. Atherosclerosis in the transverse aorta. No pleural effusion or pneumothorax. Clear lungs. IMPRESSION: No acute cardiopulmonary disease. Aortic Atherosclerosis (ICD10-I70.0). Electronically Signed   By: Abigail Miyamoto M.D.   On: 06/02/2022 19:10   CT Head Wo Contrast  Result Date: 06/02/2022 CLINICAL DATA:  Weakness and confusion since August 2nd. EXAM: CT HEAD WITHOUT CONTRAST TECHNIQUE: Contiguous axial images were obtained from the base of the skull through the vertex without intravenous contrast. RADIATION DOSE REDUCTION: This exam was performed according to the departmental dose-optimization program which includes automated exposure control, adjustment of the mA and/or kV according to patient size and/or use of iterative reconstruction technique. COMPARISON:  Report of a head CT of 10/16/2010 from [facility]. FINDINGS: Brain: Advanced cerebral and cerebellar atrophy. Moderate low density in the periventricular white matter likely related to small vessel disease. No mass lesion, hemorrhage, hydrocephalus, acute infarct, intra-axial, or extra-axial fluid collection. Vascular: No hyperdense vessel or unexpected calcification. Intracranial atherosclerosis. Skull: Normal Sinuses/Orbits: Normal imaged portions of the orbits and globes. Clear paranasal sinuses and mastoid air cells. Other: None. IMPRESSION: 1. No acute intracranial abnormality. 2. Cerebral/cerebellar atrophy and small vessel ischemic change. Electronically Signed   By: Abigail Miyamoto  M.D.   On: 06/02/2022 19:06    Procedures Procedures  {Document cardiac monitor, telemetry assessment procedure when appropriate:1}  Medications Ordered in ED Medications - No data to display  ED Course/ Medical Decision Making/ A&P                           Medical Decision Making  ***  {Document critical care time when appropriate:1} {Document review of labs and clinical decision tools ie heart score, Chads2Vasc2 etc:1}  {Document your independent review of radiology images, and any outside records:1} {Document your discussion with family members, caretakers, and with consultants:1} {Document social determinants of health affecting pt's care:1} {Document your decision making why or why not admission, treatments were needed:1} Final Clinical Impression(s) / ED Diagnoses Final diagnoses:  None    Rx / DC Orders ED Discharge Orders     None

## 2022-06-03 ENCOUNTER — Encounter (HOSPITAL_COMMUNITY): Payer: Self-pay | Admitting: Internal Medicine

## 2022-06-03 DIAGNOSIS — N3000 Acute cystitis without hematuria: Secondary | ICD-10-CM

## 2022-06-03 DIAGNOSIS — G9341 Metabolic encephalopathy: Secondary | ICD-10-CM

## 2022-06-03 DIAGNOSIS — I82402 Acute embolism and thrombosis of unspecified deep veins of left lower extremity: Secondary | ICD-10-CM

## 2022-06-03 DIAGNOSIS — I10 Essential (primary) hypertension: Secondary | ICD-10-CM | POA: Diagnosis not present

## 2022-06-03 DIAGNOSIS — E669 Obesity, unspecified: Secondary | ICD-10-CM

## 2022-06-03 DIAGNOSIS — N009 Acute nephritic syndrome with unspecified morphologic changes: Secondary | ICD-10-CM

## 2022-06-03 LAB — COMPREHENSIVE METABOLIC PANEL
ALT: 21 U/L (ref 0–44)
AST: 18 U/L (ref 15–41)
Albumin: 2.9 g/dL — ABNORMAL LOW (ref 3.5–5.0)
Alkaline Phosphatase: 59 U/L (ref 38–126)
Anion gap: 11 (ref 5–15)
BUN: 58 mg/dL — ABNORMAL HIGH (ref 8–23)
CO2: 31 mmol/L (ref 22–32)
Calcium: 9.6 mg/dL (ref 8.9–10.3)
Chloride: 97 mmol/L — ABNORMAL LOW (ref 98–111)
Creatinine, Ser: 1.97 mg/dL — ABNORMAL HIGH (ref 0.44–1.00)
GFR, Estimated: 25 mL/min — ABNORMAL LOW (ref >60)
Glucose, Bld: 89 mg/dL (ref 70–99)
Potassium: 3.2 mmol/L — ABNORMAL LOW (ref 3.5–5.1)
Sodium: 139 mmol/L (ref 135–145)
Total Bilirubin: 0.7 mg/dL (ref 0.3–1.2)
Total Protein: 6 g/dL — ABNORMAL LOW (ref 6.5–8.1)

## 2022-06-03 LAB — CBC WITH DIFFERENTIAL/PLATELET
Abs Immature Granulocytes: 0.1 10*3/uL — ABNORMAL HIGH (ref 0.00–0.07)
Basophils Absolute: 0 10*3/uL (ref 0.0–0.1)
Basophils Relative: 0 %
Eosinophils Absolute: 0.1 10*3/uL (ref 0.0–0.5)
Eosinophils Relative: 1 %
HCT: 34 % — ABNORMAL LOW (ref 36.0–46.0)
Hemoglobin: 11.3 g/dL — ABNORMAL LOW (ref 12.0–15.0)
Immature Granulocytes: 1 %
Lymphocytes Relative: 13 %
Lymphs Abs: 0.9 10*3/uL (ref 0.7–4.0)
MCH: 30.5 pg (ref 26.0–34.0)
MCHC: 33.2 g/dL (ref 30.0–36.0)
MCV: 91.9 fL (ref 80.0–100.0)
Monocytes Absolute: 0.6 10*3/uL (ref 0.1–1.0)
Monocytes Relative: 8 %
Neutro Abs: 5.3 10*3/uL (ref 1.7–7.7)
Neutrophils Relative %: 77 %
Platelets: 312 10*3/uL (ref 150–400)
RBC: 3.7 MIL/uL — ABNORMAL LOW (ref 3.87–5.11)
RDW: 14.5 % (ref 11.5–15.5)
WBC: 6.9 10*3/uL (ref 4.0–10.5)
nRBC: 0 % (ref 0.0–0.2)

## 2022-06-03 MED ORDER — METOPROLOL TARTRATE 25 MG PO TABS
12.5000 mg | ORAL_TABLET | Freq: Two times a day (BID) | ORAL | Status: DC
  Administered 2022-06-03 – 2022-06-05 (×6): 12.5 mg via ORAL
  Filled 2022-06-03 (×6): qty 1

## 2022-06-03 MED ORDER — ANASTROZOLE 1 MG PO TABS
1.0000 mg | ORAL_TABLET | Freq: Every day | ORAL | Status: DC
  Administered 2022-06-03 – 2022-06-05 (×3): 1 mg via ORAL
  Filled 2022-06-03 (×3): qty 1

## 2022-06-03 MED ORDER — CLONIDINE HCL 0.1 MG PO TABS: 0.1000 mg | ORAL_TABLET | ORAL | Status: DC | PRN

## 2022-06-03 MED ORDER — TIMOLOL MALEATE 0.5 % OP SOLN
1.0000 [drp] | Freq: Two times a day (BID) | OPHTHALMIC | Status: DC
Start: 2022-06-03 — End: 2022-06-05
  Administered 2022-06-03 – 2022-06-05 (×5): 1 [drp] via OPHTHALMIC
  Filled 2022-06-03: qty 5

## 2022-06-03 MED ORDER — AMLODIPINE BESYLATE 10 MG PO TABS
10.0000 mg | ORAL_TABLET | Freq: Every day | ORAL | Status: DC
  Administered 2022-06-03 – 2022-06-05 (×3): 10 mg via ORAL
  Filled 2022-06-03 (×3): qty 1

## 2022-06-03 MED ORDER — ONDANSETRON HCL 4 MG PO TABS: 4.0000 mg | ORAL_TABLET | Freq: Four times a day (QID) | ORAL | Status: DC | PRN

## 2022-06-03 MED ORDER — LACTATED RINGERS IV SOLN: INTRAVENOUS | Status: AC

## 2022-06-03 MED ORDER — ONDANSETRON HCL 4 MG/2ML IJ SOLN: 4.0000 mg | Freq: Four times a day (QID) | INTRAMUSCULAR | Status: DC | PRN

## 2022-06-03 MED ORDER — ENSURE ENLIVE PO LIQD
237.0000 mL | Freq: Two times a day (BID) | ORAL | Status: DC
  Administered 2022-06-03 – 2022-06-05 (×4): 237 mL via ORAL

## 2022-06-03 MED ORDER — ORAL CARE MOUTH RINSE: 15.0000 mL | OROMUCOSAL | Status: DC | PRN

## 2022-06-03 MED ORDER — ACETAMINOPHEN 650 MG RE SUPP: 650.0000 mg | Freq: Four times a day (QID) | RECTAL | Status: DC | PRN

## 2022-06-03 MED ORDER — PANTOPRAZOLE SODIUM 40 MG PO TBEC
40.0000 mg | DELAYED_RELEASE_TABLET | Freq: Every day | ORAL | Status: DC
  Administered 2022-06-03 – 2022-06-05 (×3): 40 mg via ORAL
  Filled 2022-06-03 (×3): qty 1

## 2022-06-03 MED ORDER — BRIMONIDINE TARTRATE-TIMOLOL 0.2-0.5 % OP SOLN
1.0000 [drp] | Freq: Two times a day (BID) | OPHTHALMIC | Status: DC
Start: 2022-06-03 — End: 2022-06-03

## 2022-06-03 MED ORDER — APIXABAN 5 MG PO TABS
5.0000 mg | ORAL_TABLET | Freq: Two times a day (BID) | ORAL | Status: DC
  Administered 2022-06-03 – 2022-06-05 (×5): 5 mg via ORAL
  Filled 2022-06-03 (×5): qty 1

## 2022-06-03 MED ORDER — SODIUM CHLORIDE 0.9 % IV SOLN
1.0000 g | INTRAVENOUS | Status: DC
Start: 2022-06-04 — End: 2022-06-05
  Administered 2022-06-04 – 2022-06-05 (×2): 1 g via INTRAVENOUS
  Filled 2022-06-03 (×2): qty 10

## 2022-06-03 MED ORDER — POTASSIUM CHLORIDE CRYS ER 20 MEQ PO TBCR
40.0000 meq | EXTENDED_RELEASE_TABLET | Freq: Once | ORAL | Status: AC
  Administered 2022-06-03: 40 meq via ORAL
  Filled 2022-06-03: qty 2

## 2022-06-03 MED ORDER — ACETAMINOPHEN 325 MG PO TABS: 650.0000 mg | ORAL_TABLET | Freq: Four times a day (QID) | ORAL | Status: DC | PRN

## 2022-06-03 MED ORDER — NETARSUDIL-LATANOPROST 0.02-0.005 % OP SOLN: 1.0000 [drp] | Freq: Every day | OPHTHALMIC | Status: DC

## 2022-06-03 MED ORDER — PREDNISONE 20 MG PO TABS
60.0000 mg | ORAL_TABLET | Freq: Every day | ORAL | Status: DC
  Administered 2022-06-03 – 2022-06-05 (×3): 60 mg via ORAL
  Filled 2022-06-03 (×3): qty 3

## 2022-06-03 MED ORDER — BRIMONIDINE TARTRATE 0.2 % OP SOLN
1.0000 [drp] | Freq: Two times a day (BID) | OPHTHALMIC | Status: DC
Start: 2022-06-03 — End: 2022-06-05
  Administered 2022-06-03 – 2022-06-05 (×5): 1 [drp] via OPHTHALMIC
  Filled 2022-06-03: qty 5

## 2022-06-03 NOTE — Assessment & Plan Note (Signed)
Stable. BMI 36.6

## 2022-06-03 NOTE — Assessment & Plan Note (Signed)
Continue with IV rocephin 1 gram daily. Await urine culture.

## 2022-06-03 NOTE — Progress Notes (Signed)
Transition of Care [facility]) Screening Note  Patient Details  Name: [name] [name] Date of Birth: 1941-01-31  Transition of Care [facility]) CM/SW Contact:    Sherie Don, LCSW Phone Number: 06/03/2022, 11:17 AM  Transition of Care Department [facility]) has reviewed patient and no TOC needs have been identified at this time. We will continue to monitor patient advancement through interdisciplinary progression rounds. If new patient transition needs arise, please place a TOC consult.

## 2022-06-03 NOTE — Progress Notes (Signed)
Brief same-day note  Patient is a 81 year old female with history of hypertension, recent hospital admission for acute renal failure and was diagnosed with acute glomerulonephritis due to hydralazine, due to lower extremity wounds with symptoms history of confusion at home, pathology.  She was also having dysuria, urinary frequency, urgency.  She is fully functional at baseline.  Lives with daughter.  On presentation, she was hypertensive.  UA was suspicious for UTI.  Levels of creatinine of 2.3.  CT head was negative for acute intracranial normalities.  Patient was admitted for the suspicion of UTI induced acute metabolic encephalopathy.  Started on antibiotics. Patient seen at bedside.  She was taking a bath.  As per daughter she is doing much better and is currently alert and oriented.  Will continue current management for now  Assessment and plan:  Acute metabolic encephalopathy: Patient is fully alert and oriented at baseline.  Presented with confusion, lethargy.  Monitor mental status.  Delirium precautions. Metabolic encephalopathy could also be secondary to steroids but will continue to monitor and continue steroids for now  UTI/cystitis: No fever or leukocytosis.  Complaint of dysuria, urinary frequency/urgency at home.  UA suspicious for UTI.  Started on ceftriaxone, pending urine culture  History of recent acute glomerulonephritis: Associated with hydralazine.  Creatinine has improved to 1.97.  Continue prednisone 60 mg daily.  Follow-up with [facility]  Left leg DVT: Continue Eliquis  Hypertension: Monitor blood pressure.  Continue Norvasc, metoprolol  History of breast cancer: Continue anastrozole  Hypokalemia: Supplement with potassium  Obesity: BMI 36

## 2022-06-03 NOTE — Plan of Care (Signed)
  Problem: Education: Goal: Knowledge of General Education information will improve Description: Including pain rating scale, medication(s)/side effects and non-pharmacologic comfort measures 06/03/2022 1040 by Orbie Pyo, RN Outcome: Progressing 06/03/2022 0330 by Orbie Pyo, RN Outcome: Progressing   Problem: Health Behavior/Discharge Planning: Goal: Ability to manage health-related needs will improve 06/03/2022 1040 by Orbie Pyo, RN Outcome: Progressing 06/03/2022 0330 by Orbie Pyo, RN Outcome: Progressing   Problem: Clinical Measurements: Goal: Ability to maintain clinical measurements within normal limits will improve 06/03/2022 1040 by Orbie Pyo, RN Outcome: Progressing 06/03/2022 0330 by Orbie Pyo, RN Outcome: Progressing Goal: Will remain free from infection 06/03/2022 1040 by Orbie Pyo, RN Outcome: Progressing 06/03/2022 0330 by Orbie Pyo, RN Outcome: Progressing Goal: Diagnostic test results will improve 06/03/2022 1040 by Orbie Pyo, RN Outcome: Progressing 06/03/2022 0330 by Orbie Pyo, RN Outcome: Progressing Goal: Respiratory complications will improve 06/03/2022 1040 by Orbie Pyo, RN Outcome: Progressing 06/03/2022 0330 by Orbie Pyo, RN Outcome: Progressing Goal: Cardiovascular complication will be avoided 06/03/2022 1040 by Orbie Pyo, RN Outcome: Progressing 06/03/2022 0330 by Orbie Pyo, RN Outcome: Progressing   Problem: Activity: Goal: Risk for activity intolerance will decrease 06/03/2022 1040 by Orbie Pyo, RN Outcome: Progressing 06/03/2022 0330 by Orbie Pyo, RN Outcome: Progressing   Problem: Nutrition: Goal: Adequate nutrition will be maintained 06/03/2022 1040 by Orbie Pyo, RN Outcome: Progressing 06/03/2022 0330 by Orbie Pyo, RN Outcome: Progressing   Problem: Coping: Goal: Level of anxiety will decrease 06/03/2022 1040 by Orbie Pyo, RN Outcome:  Progressing 06/03/2022 0330 by Orbie Pyo, RN Outcome: Progressing   Problem: Elimination: Goal: Will not experience complications related to bowel motility 06/03/2022 1040 by Orbie Pyo, RN Outcome: Progressing 06/03/2022 0330 by Orbie Pyo, RN Outcome: Progressing Goal: Will not experience complications related to urinary retention 06/03/2022 1040 by Orbie Pyo, RN Outcome: Progressing 06/03/2022 0330 by Orbie Pyo, RN Outcome: Progressing   Problem: Pain Managment: Goal: General experience of comfort will improve 06/03/2022 1040 by Orbie Pyo, RN Outcome: Progressing 06/03/2022 0330 by Orbie Pyo, RN Outcome: Progressing   Problem: Safety: Goal: Ability to remain free from injury will improve 06/03/2022 1040 by Orbie Pyo, RN Outcome: Progressing 06/03/2022 0330 by Orbie Pyo, RN Outcome: Progressing   Problem: Skin Integrity: Goal: Risk for impaired skin integrity will decrease 06/03/2022 1040 by Orbie Pyo, RN Outcome: Progressing 06/03/2022 0330 by Orbie Pyo, RN Outcome: Progressing

## 2022-06-03 NOTE — Subjective & Objective (Addendum)
CC: confusion HPI: 81 year old African-American female history of hypertension, recent admission to [facility] for acute renal failure that ended up being acute glomerulonephritis secondary to hydralazine allergy, recent low leg DVT treated with Eliquis presents to the ER today from home with a 7-day history of increasing confusion at home.  Daughter [name] is with her today.  She states that week ago she noticed the patient being somewhat forgetful.  Over the next week, she has been very tired.  More lethargic.  Weak.  After 1 dose of IV Aloxi in the ER, the patient is somewhat improved.  She does remember that she was having some dysuria earlier in the week with urinary frequency and urgency.  Denies any fevers.  No nausea, vomiting or diarrhea.  Patient brought to the ER by her daughter.  On arrival temp 99 heart rate 80 blood pressure 186/99 satting 95% on room air.  UA showed moderate leukocyte esterase.  21-50 RBCs 21-50 RBCs.  BUN of 57, creatinine 2.3, sodium 136, potassium 4.0  White count 7.8, hemoglobin 13.1, platelets 347  CT head was negative for acute intracranial abnormality.  She has chronic cerebral and cerebellar atrophy.  Chest x-ray was negative for acute cardiopulmonary disease.  Due to the patient's confusion and UTI, [facility] hospitalist contacted for admission.

## 2022-06-03 NOTE — Assessment & Plan Note (Addendum)
Stable. Continue norvasc. dtr states lopressor dose reduced to 12.5 mg bid and her imdur was stopped this weekend by her nephrologist(Dr. Royce Macadamia).

## 2022-06-03 NOTE — Assessment & Plan Note (Signed)
Observation med/surg bed. Likely due to acute cystitis. dtr states that pt's mentation is already improving some.

## 2022-06-03 NOTE — Plan of Care (Signed)

## 2022-06-03 NOTE — Assessment & Plan Note (Signed)
Continue eliquis 5 mg bid. Was diagnosed during June 2023 hospitalization.

## 2022-06-03 NOTE — H&P (Addendum)
History and Physical    BRECKYN TROYER WUJ:[id number] DOB: 22-Aug-1941 DOA: 06/02/2022  DOS: the patient was seen and examined on 06/02/2022  PCP: Monico Blitz, MD   Patient coming from: Home  I have personally briefly reviewed patient's old medical records in [place]  CC: confusion HPI: 81 year old African-American female history of hypertension, recent admission to [facility] for acute renal failure that ended up being acute glomerulonephritis secondary to hydralazine allergy, recent low leg DVT treated with Eliquis presents to the ER today from home with a 7-day history of increasing confusion at home.  Daughter [name] is with her today.  She states that week ago she noticed the patient being somewhat forgetful.  Over the next week, she has been very tired.  More lethargic.  Weak.  After 1 dose of IV Aloxi in the ER, the patient is somewhat improved.  She does remember that she was having some dysuria earlier in the week with urinary frequency and urgency.  Denies any fevers.  No nausea, vomiting or diarrhea.  Patient brought to the ER by her daughter.  On arrival temp 99 heart rate 80 blood pressure 186/99 satting 95% on room air.  UA showed moderate leukocyte esterase.  21-50 RBCs 21-50 RBCs.  BUN of 57, creatinine 2.3, sodium 136, potassium 4.0  White count 7.8, hemoglobin 13.1, platelets 347  CT head was negative for acute intracranial abnormality.  She has chronic cerebral and cerebellar atrophy.  Chest x-ray was negative for acute cardiopulmonary disease.  Due to the patient's confusion and UTI, [facility] hospitalist contacted for admission.    ED Course: UA showed leukocyte Estrace, WBCs.  CBC normal.  BUN of 57, creatinine 2.3.  When she left the hospital in June, discharge BUN of 80, creatinine 2.97  Review of Systems:  Review of Systems  Constitutional: Negative.   HENT: Negative.    Eyes: Negative.   Respiratory: Negative.    Cardiovascular:  Negative.   Gastrointestinal: Negative.   Genitourinary:  Positive for dysuria, frequency and urgency.  Musculoskeletal: Negative.        Normally very functional.  She still drives a car.  Independent with ADLs.  Lives with her daughter Suanne Marker.  Skin: Negative.   Neurological:  Positive for weakness.       Confusion  Endo/Heme/Allergies: Negative.   Psychiatric/Behavioral: Negative.    All other systems reviewed and are negative.   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 Collene Mares   History of external beam radiation therapy 04-21-2010  to 06-02-2010   left breast cancer   Hyperparathyroidism ([place])    per pt dx 03/ 2019,  was told by surgeon not high enough to worry about   Hypertension    Vitamin D deficiency    Wears glasses     Past Surgical History:  Procedure Laterality Date   APPLICATION OF WOUND VAC Right 10/06/2018   Procedure: APPLICATION OF WOUND VAC;  Surgeon: Rod Can, MD;  Location: [facility];  Service: Orthopedics;  Laterality: Right;   BREAST LUMPECTOMY Left 2011   BREAST LUMPECTOMY Left 05/02/2020   COLONOSCOPY N/A 05/18/2013   Procedure: COLONOSCOPY;  Surgeon: Daneil Dolin, MD;  Location: [facility];  Service: Endoscopy;  Laterality: N/A;  9:30   DILATATION & CURETTAGE/HYSTEROSCOPY WITH MYOSURE N/A 05/28/2016   Procedure: DILATATION & CURETTAGE/HYSTEROSCOPY WITH MYOSURE;  Surgeon: Servando Salina, MD;  Location: [place] [facility];  Service: Gynecology;  Laterality: N/A;   INCISION AND DRAINAGE HIP Right 10/06/2018   Procedure: DEBRIDEMENT AND CLOSURE RIGHT  HIP;  Surgeon: Rod Can, MD;  Location: [facility];  Service: Orthopedics;  Laterality: Right;   TOTAL HIP ARTHROPLASTY Right 09/15/2018   Procedure: TOTAL HIP ARTHROPLASTY ANTERIOR APPROACH;  Surgeon: Rod Can, MD;  Location: [facility];  Service: Orthopedics;  Laterality: Right;   TOTAL KNEE ARTHROPLASTY Bilateral left 05-07-2009;  right 02-07-2010   both by dr Theda Sers '@[facility]'$      reports that she quit smoking about 47 years ago. Her smoking use included cigarettes. She has never used smokeless tobacco. She reports that she does not drink alcohol and does not use drugs.  Allergies  Allergen Reactions   Ace Inhibitors Swelling   Hydralazine Other (See Comments)    Causes acute renal failure. Hydralazine induced glomerular nephritis   Sulfa Antibiotics Swelling and Rash    Family History  Problem Relation Age of Onset   Breast cancer Mother    Colon cancer Neg Hx    Lung cancer Neg Hx    Ovarian cancer Neg Hx     Prior to Admission medications   Medication Sig Start Date End Date Taking? Authorizing Provider  amLODipine (NORVASC) 10 MG tablet Take 10 mg by mouth daily. 02/26/22  Yes [provider]  anastrozole (ARIMIDEX) 1 MG tablet Take 1 mg by mouth daily. 02/24/22  Yes [provider]  apixaban (ELIQUIS) 5 MG TABS tablet Take 2 tablets (10 mg total) by mouth 2 (two) times daily. X 7 days, then one tab (5 mg) bid starting 04/20/22 Patient taking differently: Take 5 mg by mouth 2 (two) times daily. 04/13/22  Yes Tat, Shanon Brow, MD  Cholecalciferol 25 MCG (1000 UT) tablet Take 1,000 Units by mouth every other day.   Yes [provider]  COMBIGAN 0.2-0.5 % ophthalmic solution Place 1 drop into both eyes every 12 (twelve) hours. 05/22/20  Yes [provider]  famotidine (PEPCID) 10 MG tablet Take 10 mg by mouth 2 (two) times daily.   Yes [provider]  furosemide (LASIX) 40 MG tablet Take 1 tablet (40 mg total) by mouth daily. 04/14/22  Yes Tat,  Shanon Brow, MD  metoprolol tartrate (LOPRESSOR) 25 MG tablet Take 0.5 tablets (12.5 mg total) by mouth 2 (two) times daily. 04/13/22  Yes Tat, Shanon Brow, MD  predniSONE (DELTASONE) 20 MG tablet Take 3 tablets (60 mg total) by mouth daily. 04/14/22  Yes Tat, Shanon Brow, MD  ROCKLATAN 0.02-0.005 % SOLN Apply 1 drop to eye at bedtime. 02/23/22  Yes [provider]  pantoprazole (PROTONIX) 40 MG tablet Take 40 mg by mouth daily. 05/29/22   [provider]    Physical Exam: Vitals:   06/02/22 2206 06/02/22 2207 06/02/22 2300 06/02/22 2330  BP: (!) 173/89  (!) 174/90 (!) 159/86  Pulse: 83  82 77  Resp: 17  17 (!) 21  Temp:  98.4 F (36.9 C)    TempSrc:  Oral    SpO2: 96%  97% 98%  Weight:  Height:        Physical Exam Vitals and nursing note reviewed.  Constitutional:      General: She is not in acute distress.    Appearance: She is obese. She is not ill-appearing, toxic-appearing or diaphoretic.  HENT:     Head: Normocephalic and atraumatic.     Nose: Nose normal. No rhinorrhea.  Eyes:     General: No scleral icterus. Cardiovascular:     Rate and Rhythm: Normal rate and regular rhythm.     Pulses: Normal pulses.  Pulmonary:     Effort: Pulmonary effort is normal. No respiratory distress.     Breath sounds: Normal breath sounds. No wheezing or rales.  Abdominal:     General: Bowel sounds are normal. There is no distension.     Tenderness: There is no abdominal tenderness. There is no guarding or rebound.  Musculoskeletal:     Right lower leg: No edema.     Left lower leg: No edema.  Skin:    General: Skin is warm and dry.     Capillary Refill: Capillary refill takes less than 2 seconds.  Neurological:     General: No focal deficit present.     Mental Status: She is alert and oriented to person, place, and time.      Labs on Admission: I have personally reviewed following labs and imaging studies  CBC: Recent Labs  Lab 06/02/22 1927  WBC 7.6  NEUTROABS 6.2  HGB  13.1  HCT 39.5  MCV 91.9  PLT 124   Basic Metabolic Panel: Recent Labs  Lab 06/02/22 1927  NA 136  K 4.0  CL 96*  CO2 28  GLUCOSE 131*  BUN 57*  CREATININE 2.31*  CALCIUM 10.1   GFR: Estimated Creatinine Clearance: 21.9 mL/min (A) (by C-G formula based on SCr of 2.31 mg/dL (H)). Liver Function Tests: Recent Labs  Lab 06/02/22 1927  AST 24  ALT 24  ALKPHOS 71  BILITOT 1.0  PROT 7.2  ALBUMIN 3.5   No results for input(s): "LIPASE", "AMYLASE" in the last 168 hours. No results for input(s): "AMMONIA" in the last 168 hours. Coagulation Profile: No results for input(s): "INR", "PROTIME" in the last 168 hours. Cardiac Enzymes: No results for input(s): "CKTOTAL", "CKMB", "CKMBINDEX", "TROPONINI", "TROPONINIHS" in the last 168 hours. BNP (last 3 results) No results for input(s): "PROBNP" in the last 8760 hours. HbA1C: No results for input(s): "HGBA1C" in the last 72 hours. CBG: No results for input(s): "GLUCAP" in the last 168 hours. Lipid Profile: No results for input(s): "CHOL", "HDL", "LDLCALC", "TRIG", "CHOLHDL", "LDLDIRECT" in the last 72 hours. Thyroid Function Tests: No results for input(s): "TSH", "T4TOTAL", "FREET4", "T3FREE", "THYROIDAB" in the last 72 hours. Anemia Panel: No results for input(s): "VITAMINB12", "FOLATE", "FERRITIN", "TIBC", "IRON", "RETICCTPCT" in the last 72 hours. Urine analysis:    Component Value Date/Time   COLORURINE YELLOW 06/02/2022 1917   APPEARANCEUR HAZY (A) 06/02/2022 1917   LABSPEC 1.012 06/02/2022 1917   PHURINE 5.0 06/02/2022 1917   GLUCOSEU NEGATIVE 06/02/2022 1917   HGBUR MODERATE (A) 06/02/2022 1917   BILIRUBINUR NEGATIVE 06/02/2022 1917   KETONESUR NEGATIVE 06/02/2022 1917   PROTEINUR 100 (A) 06/02/2022 1917   UROBILINOGEN 0.2 02/03/2010 1200   NITRITE NEGATIVE 06/02/2022 1917   LEUKOCYTESUR MODERATE (A) 06/02/2022 1917    Radiological Exams on Admission: I have personally reviewed images DG Chest 2 View  Result  Date: 06/02/2022 CLINICAL DATA:  Mental status changes.  Weakness. EXAM: CHEST -  2 VIEW COMPARISON:  04/05/2022 FINDINGS: Lateral view degraded by patient arm position. Midline trachea. Borderline cardiomegaly. Tortuous thoracic aorta. Atherosclerosis in the transverse aorta. No pleural effusion or pneumothorax. Clear lungs. IMPRESSION: No acute cardiopulmonary disease. Aortic Atherosclerosis (ICD10-I70.0). Electronically Signed   By: Abigail Miyamoto M.D.   On: 06/02/2022 19:10   CT Head Wo Contrast  Result Date: 06/02/2022 CLINICAL DATA:  Weakness and confusion since August 2nd. EXAM: CT HEAD WITHOUT CONTRAST TECHNIQUE: Contiguous axial images were obtained from the base of the skull through the vertex without intravenous contrast. RADIATION DOSE REDUCTION: This exam was performed according to the departmental dose-optimization program which includes automated exposure control, adjustment of the mA and/or kV according to patient size and/or use of iterative reconstruction technique. COMPARISON:  Report of a head CT of 10/16/2010 from [facility]. FINDINGS: Brain: Advanced cerebral and cerebellar atrophy. Moderate low density in the periventricular white matter likely related to small vessel disease. No mass lesion, hemorrhage, hydrocephalus, acute infarct, intra-axial, or extra-axial fluid collection. Vascular: No hyperdense vessel or unexpected calcification. Intracranial atherosclerosis. Skull: Normal Sinuses/Orbits: Normal imaged portions of the orbits and globes. Clear paranasal sinuses and mastoid air cells. Other: None. IMPRESSION: 1. No acute intracranial abnormality. 2. Cerebral/cerebellar atrophy and small vessel ischemic change. Electronically Signed   By: Abigail Miyamoto M.D.   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 Likely due to acute cystitis. dtr states that pt's mentation is already improving some.  Acute cystitis Continue with IV rocephin 1 gram daily. Await urine culture.  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 Pt follows with [facility]. Hydralazine added to her allergy list.  Left leg DVT (HCC) Continue eliquis 5 mg bid. Was diagnosed during June 2023 hospitalization.  Obesity (BMI 30-39.9) Stable. BMI 36.6  Essential hypertension Stable. Continue norvasc. dtr states lopressor dose reduced to 12.5 mg bid and her imdur was stopped this weekend by her nephrologist(Dr. Royce Macadamia).   DVT prophylaxis: Eliquis Code Status: Full Code Family Communication: discussed with pt's dtr bonnie at bedside  Disposition Plan: return home  Consults called: none  Admission status: Observation, Med-Surg   Kristopher Oppenheim, DO [facility] 06/03/2022, 12:44 AM

## 2022-06-03 NOTE — Assessment & Plan Note (Addendum)
Continue with prednisone 60 mg daily. Pt follows with [facility]. Hydralazine added to her allergy list.

## 2022-06-04 DIAGNOSIS — Z6836 Body mass index (BMI) 36.0-36.9, adult: Secondary | ICD-10-CM | POA: Diagnosis not present

## 2022-06-04 DIAGNOSIS — C50419 Malignant neoplasm of upper-outer quadrant of unspecified female breast: Secondary | ICD-10-CM | POA: Diagnosis present

## 2022-06-04 DIAGNOSIS — F419 Anxiety disorder, unspecified: Secondary | ICD-10-CM | POA: Diagnosis present

## 2022-06-04 DIAGNOSIS — N3000 Acute cystitis without hematuria: Secondary | ICD-10-CM | POA: Diagnosis present

## 2022-06-04 DIAGNOSIS — I10 Essential (primary) hypertension: Secondary | ICD-10-CM | POA: Diagnosis present

## 2022-06-04 DIAGNOSIS — G9341 Metabolic encephalopathy: Secondary | ICD-10-CM | POA: Diagnosis not present

## 2022-06-04 DIAGNOSIS — E213 Hyperparathyroidism, unspecified: Secondary | ICD-10-CM | POA: Diagnosis present

## 2022-06-04 DIAGNOSIS — Z96653 Presence of artificial knee joint, bilateral: Secondary | ICD-10-CM | POA: Diagnosis present

## 2022-06-04 DIAGNOSIS — E876 Hypokalemia: Secondary | ICD-10-CM | POA: Diagnosis not present

## 2022-06-04 DIAGNOSIS — Z7901 Long term (current) use of anticoagulants: Secondary | ICD-10-CM | POA: Diagnosis not present

## 2022-06-04 DIAGNOSIS — Z7952 Long term (current) use of systemic steroids: Secondary | ICD-10-CM | POA: Diagnosis not present

## 2022-06-04 DIAGNOSIS — Z888 Allergy status to other drugs, medicaments and biological substances status: Secondary | ICD-10-CM | POA: Diagnosis not present

## 2022-06-04 DIAGNOSIS — M1990 Unspecified osteoarthritis, unspecified site: Secondary | ICD-10-CM | POA: Diagnosis present

## 2022-06-04 DIAGNOSIS — E669 Obesity, unspecified: Secondary | ICD-10-CM | POA: Diagnosis present

## 2022-06-04 DIAGNOSIS — Z86718 Personal history of other venous thrombosis and embolism: Secondary | ICD-10-CM | POA: Diagnosis not present

## 2022-06-04 DIAGNOSIS — Z87891 Personal history of nicotine dependence: Secondary | ICD-10-CM | POA: Diagnosis not present

## 2022-06-04 DIAGNOSIS — Z923 Personal history of irradiation: Secondary | ICD-10-CM | POA: Diagnosis not present

## 2022-06-04 DIAGNOSIS — Z96641 Presence of right artificial hip joint: Secondary | ICD-10-CM | POA: Diagnosis present

## 2022-06-04 DIAGNOSIS — K219 Gastro-esophageal reflux disease without esophagitis: Secondary | ICD-10-CM | POA: Diagnosis present

## 2022-06-04 DIAGNOSIS — Z882 Allergy status to sulfonamides status: Secondary | ICD-10-CM | POA: Diagnosis not present

## 2022-06-04 DIAGNOSIS — E559 Vitamin D deficiency, unspecified: Secondary | ICD-10-CM | POA: Diagnosis present

## 2022-06-04 DIAGNOSIS — Z79899 Other long term (current) drug therapy: Secondary | ICD-10-CM | POA: Diagnosis not present

## 2022-06-04 DIAGNOSIS — Z87448 Personal history of other diseases of urinary system: Secondary | ICD-10-CM | POA: Diagnosis not present

## 2022-06-04 LAB — BASIC METABOLIC PANEL
Anion gap: 6 (ref 5–15)
BUN: 54 mg/dL — ABNORMAL HIGH (ref 8–23)
CO2: 29 mmol/L (ref 22–32)
Calcium: 10.1 mg/dL (ref 8.9–10.3)
Chloride: 104 mmol/L (ref 98–111)
Creatinine, Ser: 1.84 mg/dL — ABNORMAL HIGH (ref 0.44–1.00)
GFR, Estimated: 28 mL/min — ABNORMAL LOW (ref >60)
Glucose, Bld: 124 mg/dL — ABNORMAL HIGH (ref 70–99)
Potassium: 3.9 mmol/L (ref 3.5–5.1)
Sodium: 139 mmol/L (ref 135–145)

## 2022-06-04 LAB — URINE CULTURE

## 2022-06-04 LAB — AMMONIA: Ammonia: 20 umol/L (ref 9–35)

## 2022-06-04 NOTE — Progress Notes (Signed)
PROGRESS NOTE  [name] [name]  RSW:[id number] DOB: [birth year] DOA: 06/02/2022 PCP: Monico Blitz, MD   Brief Narrative:    Patient is a 81 year old female with history of hypertension, recent hospital admission for acute renal failure and was diagnosed with acute glomerulonephritis due to hydralazine, due to lower extremity wounds with symptoms history of confusion at home, pathology.  She was also having dysuria, urinary frequency, urgency.  She is fully functional at baseline.  Lives with daughter.  On presentation, she was hypertensive.  UA was suspicious for UTI.  Levels of creatinine of 2.3.  CT head was negative for acute intracranial normalities.  Patient was admitted for the suspicion of UTI induced acute metabolic encephalopathy.  Started on antibiotics.  Her mental status is fluctuating and she gets intermittently confused, could be contributed by hospital-acquired delirium.  Plan for physical therapy assessment today.   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  Presented with confusion, lethargy.  Confusion thought to be from UTI. Her mental status is fluctuating and she gets intermittently confused, could be contributed by hospital-acquired delirium.    UTI/cystitis: No fever or leukocytosis.  Complaint of dysuria, urinary frequency/urgency at home.  UA suspicious for UTI.  Started on ceftriaxone.  Urine culture showed multiple species.  Will continue current antibiotics for now.   History of recent acute glomerulonephritis: Associated with hydralazine.  Creatinine has improved .  Continue prednisone 60 mg daily.  Follow-up with [facility]    Left leg DVT: Continue Eliquis   Hypertension: Monitor blood pressure.  Continue Norvasc, metoprolol   History of breast cancer: Continue anastrozole   Hypokalemia: Supplement with potassium   Obesity: BMI 36       DVT prophylaxis:SCDs Start: 06/03/22 0158 apixaban (ELIQUIS) tablet 5 mg     Code Status: Full Code  Family Communication: Daughter at bedside  Patient status:Inpatient  Patient is from :Home  Anticipated discharge KX:FGHW  Estimated DC date:tomorrow   Consultants: None  Procedures: None  Antimicrobials:  Anti-infectives (From admission, onward)    Start     Dose/Rate Route Frequency Ordered Stop   06/04/22 0800  cefTRIAXone (ROCEPHIN) 1 g in sodium chloride 0.9 % 100 mL IVPB        1 g 200 mL/hr over 30 Minutes Intravenous Every 24 hours 06/03/22 0157     06/02/22 2300  cefTRIAXone (ROCEPHIN) 1 g in sodium chloride 0.9 % 100 mL IVPB        1 g 200 mL/hr over 30 Minutes Intravenous STAT 06/02/22 2257 06/03/22 0027       Subjective: Patient seen and examined at the bedside this morning.  Hemodynamically stable.  Lying in bed.  Daughter at the bedside.  She was not in any kind of distress.  Patient was found to be slightly confused during my evaluation.  Slow to response but obeys all commands.  Objective: Vitals:   06/03/22 1425 06/03/22 2105 06/04/22 0456 06/04/22 1028  BP: (!) 144/72 138/76 (!) 170/86 (!) 154/76  Pulse: 66 76 78   Resp: '20 18 18   '$ Temp: 98.4 F (36.9 C) 98.3 F (36.8 C) 98.5 F (36.9 C)  TempSrc: Oral Oral Oral   SpO2: 98% 97% 97%   Weight:   93.4 kg   Height:        Intake/Output Summary (Last 24 hours) at 06/04/2022 1125 Last data filed at 06/04/2022 0846 Gross per 24 hour  Intake 946.83 ml  Output --  Net 946.83 ml   Filed Weights   06/02/22 1752 06/03/22 0201 06/04/22 0456  Weight: 95.3 kg 92.4 kg 93.4 kg    Examination:  General exam: Overall comfortable, not in distress, obese HEENT:  PERRL Respiratory system:  no wheezes or crackles  Cardiovascular system: S1 & S2 heard, RRR.  Gastrointestinal system: Abdomen is nondistended, soft and nontender. Central nervous system: Alert and awake, oriented to place only. Extremities: No edema, no clubbing ,no cyanosis Skin: No rashes, no ulcers,no icterus     Data Reviewed: I have personally reviewed following labs and imaging studies  CBC: Recent Labs  Lab 06/02/22 1927 06/03/22 0507  WBC 7.6 6.9  NEUTROABS 6.2 5.3  HGB 13.1 11.3*  HCT 39.5 34.0*  MCV 91.9 91.9  PLT 347 403   Basic Metabolic Panel: Recent Labs  Lab 06/02/22 1927 06/03/22 0507 06/04/22 0534  NA 136 139 139  K 4.0 3.2* 3.9  CL 96* 97* 104  CO2 '28 31 29  '$ GLUCOSE 131* 89 124*  BUN 57* 58* 54*  CREATININE 2.31* 1.97* 1.84*  CALCIUM 10.1 9.6 10.1     Recent Results (from the past 240 hour(s))  Urine Culture     Status: Abnormal   Collection Time: 06/02/22  7:17 PM   Specimen: Urine, Clean Catch  Result Value Ref Range Status   Specimen Description   Final    URINE, CLEAN CATCH Performed at [facility], [place] [address] [place], [place] [address]    Special Requests   Final    NONE Performed at [facility], [place] [address], [place], [place] [address]    Culture MULTIPLE SPECIES PRESENT, SUGGEST RECOLLECTION (A)  Final   Report Status 06/04/2022 FINAL  Final     Radiology Studies: DG Chest 2 View  Result Date: 06/02/2022 CLINICAL DATA:  Mental status changes.  Weakness. EXAM: CHEST - 2 VIEW COMPARISON:  04/05/2022 FINDINGS: Lateral view degraded by patient arm position. Midline trachea. Borderline cardiomegaly. Tortuous thoracic aorta. Atherosclerosis in the transverse aorta. No pleural effusion or pneumothorax. Clear lungs. IMPRESSION: No acute cardiopulmonary disease. Aortic Atherosclerosis (ICD10-I70.0). Electronically Signed   By: Abigail Miyamoto M.D.   On: 06/02/2022 19:10   CT Head Wo  Contrast  Result Date: 06/02/2022 CLINICAL DATA:  Weakness and confusion since August 2nd. EXAM: CT HEAD WITHOUT CONTRAST TECHNIQUE: Contiguous axial images were obtained from the base of the skull through the vertex without intravenous contrast. RADIATION DOSE REDUCTION: This exam was performed according to the departmental dose-optimization program which includes automated exposure control, adjustment of the mA and/or kV according to patient size and/or use of iterative reconstruction technique. COMPARISON:  Report of a head CT of 10/16/2010 from [facility]. FINDINGS: Brain: Advanced cerebral and cerebellar atrophy. Moderate low density in the periventricular white matter likely related to small vessel disease. No mass lesion, hemorrhage, hydrocephalus, acute infarct, intra-axial, or extra-axial fluid collection. Vascular: No hyperdense vessel or unexpected calcification. Intracranial atherosclerosis. Skull: Normal Sinuses/Orbits: Normal imaged portions of the orbits and globes. Clear paranasal sinuses and mastoid air cells. Other: None. IMPRESSION: 1. No acute intracranial abnormality. 2. Cerebral/cerebellar atrophy and small vessel ischemic change. Electronically Signed  By: Abigail Miyamoto M.D.   On: 06/02/2022 19:06    Scheduled Meds:  amLODipine  10 mg Oral Daily   anastrozole  1 mg Oral Daily   apixaban  5 mg Oral BID   brimonidine  1 drop Both Eyes BID   feeding supplement  237 mL Oral BID BM   metoprolol tartrate  12.5 mg Oral BID   Netarsudil-Latanoprost  1 drop Ophthalmic QHS   pantoprazole  40 mg Oral Daily   predniSONE  60 mg Oral Daily   timolol  1 drop Both Eyes BID   Continuous Infusions:  cefTRIAXone (ROCEPHIN)  IV 1 g (06/04/22 0906)     LOS: 0 days   Shelly Coss, MD [facility] P8/07/2022, 11:25 AM

## 2022-06-04 NOTE — Progress Notes (Signed)
Initial Nutrition Assessment  DOCUMENTATION CODES:   Obesity unspecified  INTERVENTION:   -Ensure Enlive po BID, each supplement provides 350 kcal and 20 grams of protein.  NUTRITION DIAGNOSIS:   Increased nutrient needs related to acute illness as evidenced by estimated needs.  GOAL:   Patient will meet greater than or equal to 90% of their needs  MONITOR:   PO intake, Supplement acceptance, Weight trends, I & O's, Labs  REASON FOR ASSESSMENT:   Malnutrition Screening Tool    ASSESSMENT:   81 year old female with history of hypertension, recent hospital admission for acute renal failure and was diagnosed with acute glomerulonephritis due to hydralazine, due to lower extremity wounds with symptoms history of confusion at home, pathology. Patient was admitted for the suspicion of UTI induced acute metabolic encephalopathy.  Patient in room, daughter at bedside. Pt reports her appetite has improved. Ate most her breakfast this morning. Ate 100% of breakfast and lunch yesterday. Had a small cup of ice cream and a few grapes for dinner. She is drinking the Ensure supplements.  Pt's daughter reports pt has started to lose weight in June following an incident where a new medication was affecting her kidneys.  Pt does not eat as well at home when more confused as well.  Per weight records, pt has lost 31 lbs since 6/11 (13% wt loss x 2 months, significant for time frame).  Medications reviewed.  Labs reviewed.  NUTRITION - FOCUSED PHYSICAL EXAM:  No depletions noted.  Diet Order:   Diet Order             Diet heart healthy/carb modified Room service appropriate? Yes; Fluid consistency: Thin  Diet effective now                   EDUCATION NEEDS:   No education needs have been identified at this time  Skin:  Skin Assessment: Reviewed RN Assessment  Last BM:  8/8  Height:   Ht Readings from Last 1 Encounters:  06/03/22 '5\' 3"'$  (1.6 m)    Weight:   Wt  Readings from Last 1 Encounters:  06/04/22 93.4 kg    BMI:  Body mass index is 36.48 kg/m.  Estimated Nutritional Needs:   Kcal:  1350-1550  Protein:  65-80g  Fluid:  1.5L/day  Clayton Bibles, MS, RD, LDN Inpatient Clinical Dietitian Contact information available via Amion

## 2022-06-04 NOTE — Evaluation (Signed)
Physical Therapy Evaluation Patient Details Name: [name] [name] MRN: [id number] DOB: 01-14-1941 Today's Date: 06/04/2022  History of Present Illness  81 year old female with history of hypertension, recent hospital admission 6/11-6/19/23 for acute renal failure and was diagnosed with acute glomerulonephritis due to hydralazine, due to lower extremity wounds.  She was also having dysuria, urinary frequency, urgency.  On presentation, she was hypertensive.  UA was suspicious for UTI.  Levels of creatinine of 2.3.  CT head was negative for acute intracranial normalities.  Patient was admitted for the suspicion of UTI induced acute metabolic encephalopathy  Clinical Impression  Pt admitted with above diagnosis. Pt ambulated 90', initially with a cane but was unsteady so switched to a RW, no loss of balance. At baseline, pt ambulates with a straight cane. RW recommended for ambulation for now due to mild unsteadiness with cane. Pt currently with functional limitations due to the deficits listed below (see PT Problem List). Pt will benefit from skilled PT to increase their independence and safety with mobility to allow discharge to the venue listed below.          Recommendations for follow up therapy are one component of a multi-disciplinary discharge planning process, led by the attending physician.  Recommendations may be updated based on patient status, additional functional criteria and insurance authorization.  Follow Up Recommendations Home health PT      Assistance Recommended at Discharge Intermittent Supervision/Assistance  Patient can return home with the following  A little help with bathing/dressing/bathroom;Assistance with cooking/housework;Help with stairs or ramp for entrance;Assist for transportation    Equipment Recommendations None recommended by PT  Recommendations for Other Services       Functional Status Assessment Patient has had a recent decline in their functional  status and demonstrates the ability to make significant improvements in function in a reasonable and predictable amount of time.     Precautions / Restrictions Precautions Precautions: Fall Precaution Comments: pt/daughter deny falls in past 6 months Restrictions Weight Bearing Restrictions: No      Mobility  Bed Mobility               General bed mobility comments: up in recliner    Transfers Overall transfer level: Modified independent Equipment used: None               General transfer comment: used armrests of chair    Ambulation/Gait Ambulation/Gait assistance: Supervision Gait Distance (Feet): 90 Feet Assistive device: Rolling walker (2 wheels) Gait Pattern/deviations: Step-through pattern, Decreased stride length Gait velocity: decr     General Gait Details: pt initially used SPC but was mildly unsteady so switched to RW, pt was steady with RW, no loss of balance  Stairs            Wheelchair Mobility    Modified Rankin (Stroke Patients Only)       Balance Overall balance assessment: Modified Independent                                           Pertinent Vitals/Pain Pain Assessment Pain Assessment: No/denies pain    Home Living Family/patient expects to be discharged to:: Private residence Living Arrangements: Children Available Help at Discharge: Family;Available PRN/intermittently Type of Home: House Home Access: Ramped entrance       Home Layout: One level Home Equipment: Rolling Walker (2 wheels);BSC/3in1;Cane - single point Additional Comments:  lives with daughter who works during the day, family plans to bring in help to be with pt during the day    Prior Function Prior Level of Function : Independent/Modified Independent             Mobility Comments: walks with [facility], denies falls in past 6 months ADLs Comments: Independent     Hand Dominance        Extremity/Trunk Assessment   Upper  Extremity Assessment Upper Extremity Assessment: Defer to OT evaluation    Lower Extremity Assessment Lower Extremity Assessment: Overall WFL for tasks assessed    Cervical / Trunk Assessment Cervical / Trunk Assessment: Normal  Communication   Communication: No difficulties  Cognition Arousal/Alertness: Awake/alert Behavior During Therapy: WFL for tasks assessed/performed Overall Cognitive Status: Within Functional Limits for tasks assessed                                          General Comments      Exercises     Assessment/Plan    PT Assessment Patient needs continued PT services  PT Problem List Decreased balance;Decreased activity tolerance       PT Treatment Interventions Therapeutic exercise;Gait training;Patient/family education;Therapeutic activities;Functional mobility training    PT Goals (Current goals can be found in the Care Plan section)  Acute Rehab PT Goals Patient Stated Goal: return home PT Goal Formulation: With patient/family Time For Goal Achievement: 06/18/22 Potential to Achieve Goals: Good    Frequency Min 3X/week     Co-evaluation               AM-PAC PT "6 Clicks" Mobility  Outcome Measure Help needed turning from your back to your side while in a flat bed without using bedrails?: None Help needed moving from lying on your back to sitting on the side of a flat bed without using bedrails?: A Little Help needed moving to and from a bed to a chair (including a wheelchair)?: A Little Help needed standing up from a chair using your arms (e.g., wheelchair or bedside chair)?: None Help needed to walk in hospital room?: A Little Help needed climbing 3-5 steps with a railing? : A Little 6 Click Score: 20    End of Session Equipment Utilized During Treatment: Gait belt Activity Tolerance: Patient tolerated treatment well Patient left: in chair;with call bell/phone within reach;with family/visitor present Nurse  Communication: Mobility status PT Visit Diagnosis: Unsteadiness on feet (R26.81)    Time: 1437-1450 PT Time Calculation (min) (ACUTE ONLY): 13 min   Charges:   PT Evaluation $PT Eval Moderate Complexity: 1 Mod         Philomena Doheny PT 06/04/2022  Acute Rehabilitation Services  Office [phone]

## 2022-06-05 DIAGNOSIS — G9341 Metabolic encephalopathy: Secondary | ICD-10-CM | POA: Diagnosis not present

## 2022-06-05 LAB — BASIC METABOLIC PANEL
Anion gap: 8 (ref 5–15)
BUN: 48 mg/dL — ABNORMAL HIGH (ref 8–23)
CO2: 29 mmol/L (ref 22–32)
Calcium: 10.3 mg/dL (ref 8.9–10.3)
Chloride: 102 mmol/L (ref 98–111)
Creatinine, Ser: 1.56 mg/dL — ABNORMAL HIGH (ref 0.44–1.00)
GFR, Estimated: 34 mL/min — ABNORMAL LOW (ref >60)
Glucose, Bld: 130 mg/dL — ABNORMAL HIGH (ref 70–99)
Potassium: 3.4 mmol/L — ABNORMAL LOW (ref 3.5–5.1)
Sodium: 139 mmol/L (ref 135–145)

## 2022-06-05 MED ORDER — CEPHALEXIN 500 MG PO CAPS
500.0000 mg | ORAL_CAPSULE | Freq: Three times a day (TID) | ORAL | 0 refills | Status: AC
Start: 2022-06-06 — End: 2022-06-08

## 2022-06-05 MED ORDER — POTASSIUM CHLORIDE CRYS ER 20 MEQ PO TBCR: 40.0000 meq | EXTENDED_RELEASE_TABLET | Freq: Every day | ORAL | 0 refills | Status: DC

## 2022-06-05 MED ORDER — METOPROLOL TARTRATE 25 MG PO TABS: 25.0000 mg | ORAL_TABLET | Freq: Two times a day (BID) | ORAL | 0 refills | Status: AC

## 2022-06-05 MED ORDER — POTASSIUM CHLORIDE CRYS ER 20 MEQ PO TBCR
40.0000 meq | EXTENDED_RELEASE_TABLET | Freq: Once | ORAL | Status: AC
  Administered 2022-06-05: 40 meq via ORAL
  Filled 2022-06-05: qty 2

## 2022-06-05 NOTE — TOC Progression Note (Signed)
Transition of Care [facility]) - Progression Note    Patient Details  Name: [name] [name] MRN: [id number] Date of Birth: May 06, 1941  Transition of Care [facility]) CM/SW Contact  Servando Snare, Nina Phone Number: 06/05/2022, 12:07 PM  Clinical Narrative:    Patient needs home health PT. No agency has accepted patient. TOC will continue to follow.      Barriers to Discharge: Continued Medical Work up  Expected Discharge Plan and Services           Expected Discharge Date: 06/05/22                                     Social Determinants of Health (SDOH) Interventions    Readmission Risk Interventions     No data to display

## 2022-06-05 NOTE — Discharge Summary (Signed)
Physician Discharge Summary  [name] [name] YNW:[id number] DOB: Sep 13, 1941 DOA: 06/02/2022  PCP: Monico Blitz, MD  Admit date: 06/02/2022 Discharge date: 06/05/2022  Admitted From: Home Disposition:  Home  Discharge Condition:Stable CODE STATUS:FULL Diet recommendation: Heart Healthy   Brief/Interim Summary:  Patient is a 81 year old female with history of hypertension, recent hospital admission for acute renal failure and was diagnosed with acute glomerulonephritis due to hydralazine, due to lower extremity wounds with symptoms history of confusion at home, pathology.  She was also having dysuria, urinary frequency, urgency.  She is fully functional at baseline.  Lives with daughter.  On presentation, she was hypertensive.  UA was suspicious for UTI.  creatinine of 2.3.  CT head was negative for acute intracranial normalities.  Patient was admitted for the suspicion of UTI induced acute metabolic encephalopathy.  Started on antibiotics.  Patient's mental status significantly improved and she is currently alert and oriented.  PT recommended home health on discharge.  Medically stable for discharge today with antibiotics orally.  Following problems were addressed during her hospitalization:   Acute metabolic encephalopathy: Patient is fully alert and oriented at baseline.  Presented with confusion, lethargy.  Confusion thought to be from UTI.  Alert and oriented today   UTI/cystitis: No fever or leukocytosis.  Complaint of dysuria, urinary frequency/urgency at home.  UA suspicious for UTI.  Started on ceftriaxone.  Urine culture showed multiple species.  Continue keflex for 2 more days to complete antibiotics course of 5 days.  History of recent acute glomerulonephritis: Associated with hydralazine.  Creatinine has improved .  Continue prednisone 60 mg daily.  Follow-up with [facility]   Left leg DVT: Continue Eliquis   Hypertension: Monitor blood pressure.  Continue Norvasc,  metoprolol.  Dose of metoprolol increased to 25 mg twice daily.   History of breast cancer: Continue anastrozole   Hypokalemia: Supplemented with potassium   Obesity: BMI 36     Discharge Diagnoses:  Principal Problem:   Acute metabolic encephalopathy Active Problems:   Acute cystitis   Left leg DVT (HCC)   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   Malignant neoplasm of upper outer quadrant of female breast ([place])   Essential hypertension   Obesity (BMI 30-39.9)    Discharge Instructions  Discharge Instructions     Diet - low sodium heart healthy   Complete by: As directed    Discharge instructions   Complete by: As directed    1)Please take prescribed medications as instructed 2)Follow up with your PCP in a week 3)Monitor your blood pressure at home   Increase activity slowly   Complete by: As directed       Allergies as of 06/05/2022       Reactions   Ace Inhibitors Swelling   Hydralazine Other (See Comments)   Causes acute renal failure. Hydralazine induced glomerular nephritis   Sulfa Antibiotics Swelling, Rash        Medication List     TAKE these medications    amLODipine 10 MG tablet Commonly known as: NORVASC Take 10 mg by mouth daily.   anastrozole 1 MG tablet Commonly known as: ARIMIDEX Take 1 mg by mouth daily.   apixaban 5 MG Tabs tablet Commonly known as: ELIQUIS Take 2 tablets (10 mg total) by mouth 2 (two) times daily. X 7 days, then one tab (5 mg) bid starting 04/20/22 What changed:  how much to take additional instructions   cephALEXin 500 MG capsule Commonly known as: KEFLEX Take 1  capsule (500 mg total) by mouth 3 (three) times daily for 2 days. Start taking on: June 06, 2022   Cholecalciferol 25 MCG (1000 UT) tablet Take 1,000 Units by mouth every other day.   Combigan 0.2-0.5 % ophthalmic solution Generic drug: brimonidine-timolol Place 1 drop into both eyes every 12 (twelve) hours.   famotidine 10 MG  tablet Commonly known as: PEPCID Take 10 mg by mouth 2 (two) times daily.   furosemide 40 MG tablet Commonly known as: LASIX Take 1 tablet (40 mg total) by mouth daily.   metoprolol tartrate 25 MG tablet Commonly known as: LOPRESSOR Take 1 tablet (25 mg total) by mouth 2 (two) times daily. What changed: how much to take   pantoprazole 40 MG tablet Commonly known as: PROTONIX Take 40 mg by mouth daily.   potassium chloride SA 20 MEQ tablet Commonly known as: KLOR-CON M Take 2 tablets (40 mEq total) by mouth daily for 2 doses. Start taking on: June 06, 2022   predniSONE 20 MG tablet Commonly known as: DELTASONE Take 3 tablets (60 mg total) by mouth daily.   Rocklatan 0.02-0.005 % Soln Generic drug: Netarsudil-Latanoprost Apply 1 drop to eye at bedtime.        Follow-up Information     Monico Blitz, MD. Schedule an appointment as soon as possible for a visit in 1 week(s).   Specialty: Internal Medicine Contact information: [address]  [place] [address] [phone]                Allergies  Allergen Reactions   Ace Inhibitors Swelling   Hydralazine Other (See Comments)    Causes acute renal failure. Hydralazine induced glomerular nephritis   Sulfa Antibiotics Swelling and Rash    Consultations: None   Procedures/Studies: DG Chest 2 View  Result Date: 06/02/2022 CLINICAL DATA:  Mental status changes.  Weakness. EXAM: CHEST - 2 VIEW COMPARISON:  04/05/2022 FINDINGS: Lateral view degraded by patient arm position. Midline trachea. Borderline cardiomegaly. Tortuous thoracic aorta. Atherosclerosis in the transverse aorta. No pleural effusion or pneumothorax. Clear lungs. IMPRESSION: No acute cardiopulmonary disease. Aortic Atherosclerosis (ICD10-I70.0). Electronically Signed   By: Abigail Miyamoto M.D.   On: 06/02/2022 19:10   CT Head Wo Contrast  Result Date: 06/02/2022 CLINICAL DATA:  Weakness and confusion since August 2nd. EXAM: CT HEAD WITHOUT CONTRAST  TECHNIQUE: Contiguous axial images were obtained from the base of the skull through the vertex without intravenous contrast. RADIATION DOSE REDUCTION: This exam was performed according to the departmental dose-optimization program which includes automated exposure control, adjustment of the mA and/or kV according to patient size and/or use of iterative reconstruction technique. COMPARISON:  Report of a head CT of 10/16/2010 from [facility]. FINDINGS: Brain: Advanced cerebral and cerebellar atrophy. Moderate low density in the periventricular white matter likely related to small vessel disease. No mass lesion, hemorrhage, hydrocephalus, acute infarct, intra-axial, or extra-axial fluid collection. Vascular: No hyperdense vessel or unexpected calcification. Intracranial atherosclerosis. Skull: Normal Sinuses/Orbits: Normal imaged portions of the orbits and globes. Clear paranasal sinuses and mastoid air cells. Other: None. IMPRESSION: 1. No acute intracranial abnormality. 2. Cerebral/cerebellar atrophy and small vessel ischemic change. Electronically Signed   By: Abigail Miyamoto M.D.   On: 06/02/2022 19:06      Subjective: Seen and examined the bedside this morning.  She was alert and oriented today.  Sitting on the chair.  Comfortable, denies any complaints, eager to go home.  Discharge planning discussed with daughter at the bedside.  Discharge Exam: Vitals:   06/04/22 2004 06/05/22 0432  BP: (!) 159/82 (!) 164/79  Pulse: 78 70  Resp: 18 18  Temp: 98 F (36.7 C) 98.4 F (36.9 C)  SpO2: 99% 99%   Vitals:   06/04/22 1028 06/04/22 1328 06/04/22 2004 06/05/22 0432  BP: (!) 154/76 (!) 158/77 (!) 159/82 (!) 164/79  Pulse:  71 78 70  Resp:  '17 18 18  '$ Temp:  98.1 F (36.7 C) 98 F (36.7 C) 98.4 F (36.9 C)  TempSrc:  Oral Oral Oral  SpO2:  99% 99% 99%  Weight:      Height:        General: Pt is alert, awake, not in acute distress, obese Cardiovascular: RRR, S1/S2 +, no rubs, no  gallops Respiratory: CTA bilaterally, no wheezing, no rhonchi Abdominal: Soft, NT, ND, bowel sounds + Extremities: no edema, no cyanosis    The results of significant diagnostics from this hospitalization (including imaging, microbiology, ancillary and laboratory) are listed below for reference.     Microbiology: Recent Results (from the past 240 hour(s))  Urine Culture     Status: Abnormal   Collection Time: 06/02/22  7:17 PM   Specimen: Urine, Clean Catch  Result Value Ref Range Status   Specimen Description   Final    URINE, CLEAN CATCH Performed at [facility], [place] [address] [place], [place] [address]    Special Requests   Final    NONE Performed at [facility], [place] [address] [place], [place] [address]    Culture MULTIPLE SPECIES PRESENT, SUGGEST RECOLLECTION (A)  Final   Report Status 06/04/2022 FINAL  Final     Labs: BNP (last 3 results) Recent Labs    04/05/22 1317  BNP 52.7   Basic Metabolic Panel: Recent Labs  Lab 06/02/22 1927 06/03/22 0507 06/04/22 0534 06/05/22 0851  NA 136 139 139 139  K 4.0 3.2* 3.9 3.4*  CL 96* 97* 104 102  CO2 '28 31 29 29  '$ GLUCOSE 131* 89 124* 130*  BUN 57* 58* 54* 48*  CREATININE 2.31* 1.97* 1.84* 1.56*  CALCIUM 10.1 9.6 10.1 10.3   Liver Function Tests: Recent Labs  Lab 06/02/22 1927 06/03/22 0507  AST 24 18  ALT 24 21  ALKPHOS 71 59  BILITOT 1.0 0.7  PROT 7.2 6.0*  ALBUMIN 3.5 2.9*   No results for input(s): "LIPASE", "AMYLASE" in the last 168 hours. Recent Labs  Lab 06/04/22 1238  AMMONIA 20   CBC: Recent Labs  Lab 06/02/22 1927 06/03/22 0507  WBC 7.6 6.9  NEUTROABS 6.2 5.3  HGB 13.1 11.3*  HCT 39.5 34.0*  MCV 91.9 91.9  PLT 347 312   Cardiac Enzymes: No results for input(s): "CKTOTAL", "CKMB", "CKMBINDEX", "TROPONINI" in the last 168 hours. BNP: Invalid input(s): "POCBNP" CBG: No results for input(s): "GLUCAP" in the last 168 hours. D-Dimer No  results for input(s): "DDIMER" in the last 72 hours. Hgb A1c No results for input(s): "HGBA1C" in the last 72 hours. Lipid Profile No results for input(s): "CHOL", "HDL", "LDLCALC", "TRIG", "CHOLHDL", "LDLDIRECT" in the last 72 hours. Thyroid function studies No results for input(s): "TSH", "T4TOTAL", "T3FREE", "THYROIDAB" in the last 72 hours.  Invalid input(s): "FREET3" Anemia work up No results for input(s): "VITAMINB12", "FOLATE", "FERRITIN", "TIBC", "IRON", "RETICCTPCT" in the last 72 hours. Urinalysis    Component Value Date/Time   COLORURINE YELLOW 06/02/2022 1917   APPEARANCEUR HAZY (A) 06/02/2022 1917   LABSPEC 1.012 06/02/2022  [place] 5.0 06/02/2022 [place] 06/02/2022 1917   HGBUR MODERATE (A) 06/02/2022 1917   BILIRUBINUR NEGATIVE 06/02/2022 [place] 06/02/2022 1917   PROTEINUR 100 (A) 06/02/2022 1917   UROBILINOGEN 0.2 02/03/2010 1200   NITRITE NEGATIVE 06/02/2022 1917   LEUKOCYTESUR MODERATE (A) 06/02/2022 1917   Sepsis Labs Recent Labs  Lab 06/02/22 1927 06/03/22 0507  WBC 7.6 6.9   Microbiology Recent Results (from the past 240 hour(s))  Urine Culture     Status: Abnormal   Collection Time: 06/02/22  7:17 PM   Specimen: Urine, Clean Catch  Result Value Ref Range Status   Specimen Description   Final    URINE, CLEAN CATCH Performed at [facility], [place] [address], [place], [place] [address]    Special Requests   Final    NONE Performed at [facility], [place] [address] [place], [place] [address]    Culture MULTIPLE SPECIES PRESENT, SUGGEST RECOLLECTION (A)  Final   Report Status 06/04/2022 FINAL  Final    Please note: You were cared for by a hospitalist during your hospital stay. Once you are discharged, your primary care physician will handle any further medical issues. Please note that NO REFILLS for any discharge medications will be authorized once you are discharged, as  it is imperative that you return to your primary care physician (or establish a relationship with a primary care physician if you do not have one) for your post hospital discharge needs so that they can reassess your need for medications and monitor your lab values.    Time coordinating discharge: 40 minutes  SIGNED:   Shelly Coss, MD  [facility] 06/05/2022, 10:54 AM Pager [phone]  If 7PM-7AM, please contact night-coverage [email or web address] Password [email or web address]

## 2022-06-05 NOTE — TOC Transition Note (Signed)
Transition of Care [facility]) - CM/SW Discharge Note   Patient Details  Name: [name] [name] MRN: [id number] Date of Birth: December 13, 1940  Transition of [facility]) CM/SW Contact:  Servando Snare, LCSW Phone Number: 06/05/2022, 1:02 PM   Clinical Narrative:   PT to dc home with HH. HHPT arranged with [facility]. Family to transport.     Final next level of care: [place] Barriers to Discharge: No Barriers Identified   Patient Goals and CMS Choice Patient states their goals for this hospitalization and ongoing recovery are:: Go home CMS Medicare.gov Compare Post Acute Care list provided to:: Patient Choice offered to / list presented to : Patient  Discharge Placement                       Discharge Plan and Services                DME Arranged: N/A DME Agency: NA       HH Arranged: PT HH Agency: [place] Date [place]: 06/05/22 Time [place]: 1301 Representative spoke with at [place]: [place] ([place]) Interventions     Readmission Risk Interventions     No data to display

## 2022-06-05 NOTE — Progress Notes (Signed)
PIV removed. Discharge instructions completed. Patient verbalized understanding of medication regimen, follow up appointments and discharge instructions. Patient belongings gathered and packed to discharge.  

## 2022-06-09 DIAGNOSIS — N047 Nephrotic syndrome with diffuse crescentic glomerulonephritis: Secondary | ICD-10-CM | POA: Diagnosis not present

## 2022-06-09 DIAGNOSIS — N390 Urinary tract infection, site not specified: Secondary | ICD-10-CM | POA: Diagnosis not present

## 2022-06-09 DIAGNOSIS — I82409 Acute embolism and thrombosis of unspecified deep veins of unspecified lower extremity: Secondary | ICD-10-CM | POA: Diagnosis not present

## 2022-06-09 DIAGNOSIS — Z299 Encounter for prophylactic measures, unspecified: Secondary | ICD-10-CM | POA: Diagnosis not present

## 2022-06-09 DIAGNOSIS — E876 Hypokalemia: Secondary | ICD-10-CM | POA: Diagnosis not present

## 2022-06-09 DIAGNOSIS — I10 Essential (primary) hypertension: Secondary | ICD-10-CM | POA: Diagnosis not present

## 2022-06-10 DIAGNOSIS — K5792 Diverticulitis of intestine, part unspecified, without perforation or abscess without bleeding: Secondary | ICD-10-CM | POA: Diagnosis not present

## 2022-06-10 DIAGNOSIS — I82402 Acute embolism and thrombosis of unspecified deep veins of left lower extremity: Secondary | ICD-10-CM | POA: Diagnosis not present

## 2022-06-10 DIAGNOSIS — Z9181 History of falling: Secondary | ICD-10-CM | POA: Diagnosis not present

## 2022-06-10 DIAGNOSIS — D62 Acute posthemorrhagic anemia: Secondary | ICD-10-CM | POA: Diagnosis not present

## 2022-06-10 DIAGNOSIS — I700 Atherosclerosis of aorta: Secondary | ICD-10-CM | POA: Diagnosis not present

## 2022-06-10 DIAGNOSIS — N179 Acute kidney failure, unspecified: Secondary | ICD-10-CM | POA: Diagnosis not present

## 2022-06-10 DIAGNOSIS — I129 Hypertensive chronic kidney disease with stage 1 through stage 4 chronic kidney disease, or unspecified chronic kidney disease: Secondary | ICD-10-CM | POA: Diagnosis not present

## 2022-06-10 DIAGNOSIS — N390 Urinary tract infection, site not specified: Secondary | ICD-10-CM | POA: Diagnosis not present

## 2022-06-10 DIAGNOSIS — G9341 Metabolic encephalopathy: Secondary | ICD-10-CM | POA: Diagnosis not present

## 2022-06-10 DIAGNOSIS — N1832 Chronic kidney disease, stage 3b: Secondary | ICD-10-CM | POA: Diagnosis not present

## 2022-06-10 DIAGNOSIS — C50419 Malignant neoplasm of upper-outer quadrant of unspecified female breast: Secondary | ICD-10-CM | POA: Diagnosis not present

## 2022-06-10 DIAGNOSIS — Z7901 Long term (current) use of anticoagulants: Secondary | ICD-10-CM | POA: Diagnosis not present

## 2022-06-10 DIAGNOSIS — E876 Hypokalemia: Secondary | ICD-10-CM | POA: Diagnosis not present

## 2022-06-11 DIAGNOSIS — K922 Gastrointestinal hemorrhage, unspecified: Secondary | ICD-10-CM | POA: Diagnosis not present

## 2022-06-11 DIAGNOSIS — R6889 Other general symptoms and signs: Secondary | ICD-10-CM | POA: Diagnosis not present

## 2022-06-11 DIAGNOSIS — I10 Essential (primary) hypertension: Secondary | ICD-10-CM | POA: Diagnosis not present

## 2022-06-11 DIAGNOSIS — Z87891 Personal history of nicotine dependence: Secondary | ICD-10-CM | POA: Diagnosis not present

## 2022-06-11 DIAGNOSIS — Z743 Need for continuous supervision: Secondary | ICD-10-CM | POA: Diagnosis not present

## 2022-06-12 ENCOUNTER — Encounter (HOSPITAL_COMMUNITY): Payer: Self-pay | Admitting: Internal Medicine

## 2022-06-12 ENCOUNTER — Inpatient Hospital Stay (HOSPITAL_COMMUNITY)
Admission: EM | Admit: 2022-06-12 | Discharge: 2022-06-16 | DRG: 378 | Disposition: A | Discharge status: Home Health | Payer: Medicare Other | Attending: Family Medicine | Admitting: Family Medicine

## 2022-06-12 ENCOUNTER — Other Ambulatory Visit: Payer: Self-pay

## 2022-06-12 DIAGNOSIS — Z882 Allergy status to sulfonamides status: Secondary | ICD-10-CM | POA: Diagnosis not present

## 2022-06-12 DIAGNOSIS — D72829 Elevated white blood cell count, unspecified: Secondary | ICD-10-CM | POA: Diagnosis not present

## 2022-06-12 DIAGNOSIS — N1832 Chronic kidney disease, stage 3b: Secondary | ICD-10-CM | POA: Diagnosis present

## 2022-06-12 DIAGNOSIS — Z853 Personal history of malignant neoplasm of breast: Secondary | ICD-10-CM | POA: Diagnosis not present

## 2022-06-12 DIAGNOSIS — K922 Gastrointestinal hemorrhage, unspecified: Secondary | ICD-10-CM

## 2022-06-12 DIAGNOSIS — Z96653 Presence of artificial knee joint, bilateral: Secondary | ICD-10-CM | POA: Diagnosis not present

## 2022-06-12 DIAGNOSIS — I129 Hypertensive chronic kidney disease with stage 1 through stage 4 chronic kidney disease, or unspecified chronic kidney disease: Secondary | ICD-10-CM | POA: Diagnosis present

## 2022-06-12 DIAGNOSIS — Z803 Family history of malignant neoplasm of breast: Secondary | ICD-10-CM | POA: Diagnosis not present

## 2022-06-12 DIAGNOSIS — K625 Hemorrhage of anus and rectum: Principal | ICD-10-CM

## 2022-06-12 DIAGNOSIS — K5733 Diverticulitis of large intestine without perforation or abscess with bleeding: Secondary | ICD-10-CM | POA: Diagnosis not present

## 2022-06-12 DIAGNOSIS — N179 Acute kidney failure, unspecified: Secondary | ICD-10-CM | POA: Diagnosis not present

## 2022-06-12 DIAGNOSIS — I82402 Acute embolism and thrombosis of unspecified deep veins of left lower extremity: Secondary | ICD-10-CM | POA: Diagnosis present

## 2022-06-12 DIAGNOSIS — Z79899 Other long term (current) drug therapy: Secondary | ICD-10-CM

## 2022-06-12 DIAGNOSIS — Z7901 Long term (current) use of anticoagulants: Secondary | ICD-10-CM

## 2022-06-12 DIAGNOSIS — D6832 Hemorrhagic disorder due to extrinsic circulating anticoagulants: Secondary | ICD-10-CM | POA: Diagnosis not present

## 2022-06-12 DIAGNOSIS — K5792 Diverticulitis of intestine, part unspecified, without perforation or abscess without bleeding: Secondary | ICD-10-CM | POA: Diagnosis not present

## 2022-06-12 DIAGNOSIS — T45515A Adverse effect of anticoagulants, initial encounter: Secondary | ICD-10-CM | POA: Diagnosis not present

## 2022-06-12 DIAGNOSIS — Z888 Allergy status to other drugs, medicaments and biological substances status: Secondary | ICD-10-CM | POA: Diagnosis not present

## 2022-06-12 DIAGNOSIS — Z87891 Personal history of nicotine dependence: Secondary | ICD-10-CM | POA: Diagnosis not present

## 2022-06-12 DIAGNOSIS — I10 Essential (primary) hypertension: Secondary | ICD-10-CM | POA: Diagnosis present

## 2022-06-12 DIAGNOSIS — Z96641 Presence of right artificial hip joint: Secondary | ICD-10-CM | POA: Diagnosis present

## 2022-06-12 DIAGNOSIS — E669 Obesity, unspecified: Secondary | ICD-10-CM | POA: Diagnosis present

## 2022-06-12 DIAGNOSIS — K921 Melena: Secondary | ICD-10-CM | POA: Diagnosis not present

## 2022-06-12 DIAGNOSIS — D62 Acute posthemorrhagic anemia: Secondary | ICD-10-CM | POA: Diagnosis present

## 2022-06-12 DIAGNOSIS — E8809 Other disorders of plasma-protein metabolism, not elsewhere classified: Secondary | ICD-10-CM | POA: Diagnosis present

## 2022-06-12 DIAGNOSIS — K219 Gastro-esophageal reflux disease without esophagitis: Secondary | ICD-10-CM | POA: Diagnosis present

## 2022-06-12 DIAGNOSIS — Z6835 Body mass index (BMI) 35.0-35.9, adult: Secondary | ICD-10-CM

## 2022-06-12 DIAGNOSIS — F419 Anxiety disorder, unspecified: Secondary | ICD-10-CM | POA: Diagnosis present

## 2022-06-12 DIAGNOSIS — R1084 Generalized abdominal pain: Secondary | ICD-10-CM | POA: Diagnosis not present

## 2022-06-12 DIAGNOSIS — Z86718 Personal history of other venous thrombosis and embolism: Secondary | ICD-10-CM

## 2022-06-12 DIAGNOSIS — D500 Iron deficiency anemia secondary to blood loss (chronic): Secondary | ICD-10-CM | POA: Diagnosis not present

## 2022-06-12 DIAGNOSIS — Z923 Personal history of irradiation: Secondary | ICD-10-CM

## 2022-06-12 DIAGNOSIS — Z7952 Long term (current) use of systemic steroids: Secondary | ICD-10-CM

## 2022-06-12 LAB — TYPE AND SCREEN
ABO/RH(D): O POS
Antibody Screen: NEGATIVE

## 2022-06-12 LAB — COMPREHENSIVE METABOLIC PANEL
ALT: 57 U/L — ABNORMAL HIGH (ref 0–44)
ALT: 50 U/L — ABNORMAL HIGH (ref 0–44)
AST: 24 U/L (ref 15–41)
AST: 20 U/L (ref 15–41)
Albumin: 2.7 g/dL — ABNORMAL LOW (ref 3.5–5.0)
Albumin: 2.4 g/dL — ABNORMAL LOW (ref 3.5–5.0)
Alkaline Phosphatase: 58 U/L (ref 38–126)
Alkaline Phosphatase: 52 U/L (ref 38–126)
Anion gap: 8 (ref 5–15)
Anion gap: 7 (ref 5–15)
BUN: 50 mg/dL — ABNORMAL HIGH (ref 8–23)
BUN: 49 mg/dL — ABNORMAL HIGH (ref 8–23)
CO2: 27 mmol/L (ref 22–32)
CO2: 28 mmol/L (ref 22–32)
Calcium: 9.6 mg/dL (ref 8.9–10.3)
Calcium: 9.2 mg/dL (ref 8.9–10.3)
Chloride: 105 mmol/L (ref 98–111)
Chloride: 107 mmol/L (ref 98–111)
Creatinine, Ser: 2.04 mg/dL — ABNORMAL HIGH (ref 0.44–1.00)
Creatinine, Ser: 1.83 mg/dL — ABNORMAL HIGH (ref 0.44–1.00)
GFR, Estimated: 24 mL/min — ABNORMAL LOW (ref >60)
GFR, Estimated: 28 mL/min — ABNORMAL LOW (ref >60)
Glucose, Bld: 153 mg/dL — ABNORMAL HIGH (ref 70–99)
Glucose, Bld: 114 mg/dL — ABNORMAL HIGH (ref 70–99)
Potassium: 4.5 mmol/L (ref 3.5–5.1)
Potassium: 3.5 mmol/L (ref 3.5–5.1)
Sodium: 140 mmol/L (ref 135–145)
Sodium: 142 mmol/L (ref 135–145)
Total Bilirubin: 0.7 mg/dL (ref 0.3–1.2)
Total Bilirubin: 0.5 mg/dL (ref 0.3–1.2)
Total Protein: 5.5 g/dL — ABNORMAL LOW (ref 6.5–8.1)
Total Protein: 4.7 g/dL — ABNORMAL LOW (ref 6.5–8.1)

## 2022-06-12 LAB — CBC
HCT: 28.2 % — ABNORMAL LOW (ref 36.0–46.0)
HCT: 25.2 % — ABNORMAL LOW (ref 36.0–46.0)
HCT: 30 % — ABNORMAL LOW (ref 36.0–46.0)
Hemoglobin: 9.2 g/dL — ABNORMAL LOW (ref 12.0–15.0)
Hemoglobin: 8.3 g/dL — ABNORMAL LOW (ref 12.0–15.0)
Hemoglobin: 10 g/dL — ABNORMAL LOW (ref 12.0–15.0)
MCH: 30.6 pg (ref 26.0–34.0)
MCH: 30.9 pg (ref 26.0–34.0)
MCH: 30.8 pg (ref 26.0–34.0)
MCHC: 32.6 g/dL (ref 30.0–36.0)
MCHC: 32.9 g/dL (ref 30.0–36.0)
MCHC: 33.3 g/dL (ref 30.0–36.0)
MCV: 93.7 fL (ref 80.0–100.0)
MCV: 93.7 fL (ref 80.0–100.0)
MCV: 92.3 fL (ref 80.0–100.0)
Platelets: 309 10*3/uL (ref 150–400)
Platelets: 264 10*3/uL (ref 150–400)
Platelets: 324 10*3/uL (ref 150–400)
RBC: 3.01 MIL/uL — ABNORMAL LOW (ref 3.87–5.11)
RBC: 2.69 MIL/uL — ABNORMAL LOW (ref 3.87–5.11)
RBC: 3.25 MIL/uL — ABNORMAL LOW (ref 3.87–5.11)
RDW: 14.5 % (ref 11.5–15.5)
RDW: 14.4 % (ref 11.5–15.5)
RDW: 14.6 % (ref 11.5–15.5)
WBC: 11.5 10*3/uL — ABNORMAL HIGH (ref 4.0–10.5)
WBC: 9.5 10*3/uL (ref 4.0–10.5)
WBC: 11 10*3/uL — ABNORMAL HIGH (ref 4.0–10.5)
nRBC: 0.2 % (ref 0.0–0.2)
nRBC: 0 % (ref 0.0–0.2)
nRBC: 0 % (ref 0.0–0.2)

## 2022-06-12 LAB — POC OCCULT BLOOD, ED: Fecal Occult Bld: POSITIVE — AB

## 2022-06-12 MED ORDER — SODIUM CHLORIDE 0.9 % IV SOLN: INTRAVENOUS | Status: AC

## 2022-06-12 MED ORDER — SODIUM CHLORIDE 0.9 % IV BOLUS
1000.0000 mL | Freq: Once | INTRAVENOUS | Status: AC
  Administered 2022-06-12: 1000 mL via INTRAVENOUS

## 2022-06-12 MED ORDER — PEG 3350-KCL-NA BICARB-NACL 420 G PO SOLR
4000.0000 mL | Freq: Once | ORAL | Status: AC
  Administered 2022-06-12: 4000 mL via ORAL

## 2022-06-12 MED ORDER — METHYLPREDNISOLONE SODIUM SUCC 125 MG IJ SOLR
50.0000 mg | INTRAMUSCULAR | Status: DC
  Administered 2022-06-12 – 2022-06-15 (×4): 50 mg via INTRAVENOUS
  Filled 2022-06-12 (×4): qty 2

## 2022-06-12 NOTE — Progress Notes (Signed)
Subjective: Patient admitted this morning, see detailed H&P by Dr Marlowe Sax 81 year old female with medical history of hypertension, recent acute AGN associated with hydralazine, recent left leg DVT in June 2023 on Eliquis, history of breast cancer, obesity, GERD recently mated for encephalopathy due to UTI.  She came to ED with complaints of rectal bleeding per rectum.  She was initially seen at [facility] and they wanted to transfer to [facility] but patient left AMA and came to [facility] long ED.  In the ED hemoglobin was 9.2, FOBT was positive. GI consultation was obtained. Patient does have history of diverticulosis in the past  Vitals:   06/12/22 0730 06/12/22 0800  BP: 128/74 113/72  Pulse: 68 69  Resp: 15 13  Temp:    SpO2: 98% 98%      A/P  Rectal bleeding -Likely diverticular in setting of taking Eliquis -Eliquis is currently on hold -Bleeding has stopped -GI consult pending  Acute blood loss anemia -Secondary to above -Patient's hemoglobin was 13.1, 10 days ago -Presented with hemoglobin of 9.2; dropped to 8.3 this morning -Follow CBC in a.m.  Recent left lower extremity DVT in June 2023 -Eliquis on hold due to above  Hypertension -Antihypertensive medications on hold in the setting of acute GI bleed  Recent diagnosis of acute glomerulonephritis -Felt to be due to hydralazine which has been discontinued -Patient has been on steroids, prednisone 60 mg daily -We will start IV methylprednisolone 50 mg daily  AKI on CKD stage IIIb Likely prerenal in the setting of acute GI bleed.  BUN 50, creatinine 2.0 (baseline around 1.5).   -IV fluid hydration -Monitor renal function and avoid nephrotoxic agents/hold home Lasix      [place]

## 2022-06-12 NOTE — ED Provider Notes (Signed)
[facility] DEPT Provider Note   CSN: [id number] Arrival date & time: 06/12/22  0003     History  Chief Complaint  Patient presents with   Rectal Bleeding    [name] [name] is a 81 y.o. female.  The history is provided by the patient and a relative.  Rectal Bleeding Quality:  Bright red Amount:  Copious Duration:  1 day Timing:  Intermittent Chronicity:  New Context: not foreign body   Relieved by:  Nothing Worsened by:  Nothing Ineffective treatments:  None tried Associated symptoms: no abdominal pain and no fever   Risk factors: anticoagulant use        Home Medications Prior to Admission medications   Medication Sig Start Date End Date Taking? Authorizing Provider  amLODipine (NORVASC) 10 MG tablet Take 10 mg by mouth daily. 02/26/22   [provider]  anastrozole (ARIMIDEX) 1 MG tablet Take 1 mg by mouth daily. 02/24/22   [provider]  apixaban (ELIQUIS) 5 MG TABS tablet Take 2 tablets (10 mg total) by mouth 2 (two) times daily. X 7 days, then one tab (5 mg) bid starting 04/20/22 Patient taking differently: Take 5 mg by mouth 2 (two) times daily. 04/13/22   Orson Eva, MD  Cholecalciferol 25 MCG (1000 UT) tablet Take 1,000 Units by mouth every other day.    [provider]  COMBIGAN 0.2-0.5 % ophthalmic solution Place 1 drop into both eyes every 12 (twelve) hours. 05/22/20   [provider]  famotidine (PEPCID) 10 MG tablet Take 10 mg by mouth 2 (two) times daily.    [provider]  furosemide (LASIX) 40 MG tablet Take 1 tablet (40 mg total) by mouth daily. 04/14/22   Orson Eva, MD  metoprolol tartrate (LOPRESSOR) 25 MG tablet Take 1 tablet (25 mg total) by mouth 2 (two) times daily. 06/05/22   Shelly Coss, MD  pantoprazole (PROTONIX) 40 MG tablet Take 40 mg by mouth daily. 05/29/22   [provider]  potassium chloride SA (KLOR-CON M) 20 MEQ tablet Take 2 tablets (40 mEq total) by  mouth daily for 2 doses. 06/06/22 06/08/22  Shelly Coss, MD  predniSONE (DELTASONE) 20 MG tablet Take 3 tablets (60 mg total) by mouth daily. 04/14/22   Orson Eva, MD  ROCKLATAN 0.02-0.005 % SOLN Apply 1 drop to eye at bedtime. 02/23/22   [provider]      Allergies    Ace inhibitors, Hydralazine, and Sulfa antibiotics    Review of Systems   Review of Systems  Constitutional:  Negative for fever.  HENT:  Negative for facial swelling.   Gastrointestinal:  Positive for hematochezia. Negative for abdominal pain.  All other systems reviewed and are negative.   Physical Exam Updated Vital Signs BP (!) 150/75   Pulse 69   Temp 98.1 F (36.7 C) (Oral)   Resp 13   Ht '5\' 4"'$  (1.626 m)   Wt 92.5 kg   SpO2 95%   BMI 35.02 kg/m  Physical Exam Vitals and nursing note reviewed. Exam conducted with a chaperone present.  Constitutional:      General: She is not in acute distress.    Appearance: She is well-developed.  HENT:     Head: Normocephalic and atraumatic.     Nose: Nose normal.  Eyes:     Pupils: Pupils are equal, round, and reactive to light.  Cardiovascular:     Rate and Rhythm: Normal rate and regular rhythm.  Pulses: Normal pulses.     Heart sounds: Normal heart sounds.  Pulmonary:     Effort: Pulmonary effort is normal. No respiratory distress.     Breath sounds: Normal breath sounds.  Abdominal:     General: Bowel sounds are normal. There is no distension.     Palpations: Abdomen is soft.     Tenderness: There is no abdominal tenderness. There is no guarding or rebound.  Genitourinary:    Vagina: No vaginal discharge.     Rectum: Guaiac result positive.  Musculoskeletal:        General: Normal range of motion.     Cervical back: Neck supple.  Skin:    General: Skin is dry.     Capillary Refill: Capillary refill takes less than 2 seconds.     Findings: No erythema or rash.  Neurological:     General: No focal deficit present.     Mental Status:  She is alert and oriented to person, place, and time.     Deep Tendon Reflexes: Reflexes normal.  Psychiatric:        Mood and Affect: Mood normal.     ED Results / Procedures / Treatments   Labs (all labs ordered are listed, but only abnormal results are displayed) Results for orders placed or performed during the hospital encounter of 06/12/22  Comprehensive metabolic panel  Result Value Ref Range   Sodium 140 135 - 145 mmol/L   Potassium 4.5 3.5 - 5.1 mmol/L   Chloride 105 98 - 111 mmol/L   CO2 27 22 - 32 mmol/L   Glucose, Bld 153 (H) 70 - 99 mg/dL   BUN 50 (H) 8 - 23 mg/dL   Creatinine, Ser 2.04 (H) 0.44 - 1.00 mg/dL   Calcium 9.6 8.9 - 10.3 mg/dL   Total Protein 5.5 (L) 6.5 - 8.1 g/dL   Albumin 2.7 (L) 3.5 - 5.0 g/dL   AST 24 15 - 41 U/L   ALT 57 (H) 0 - 44 U/L   Alkaline Phosphatase 58 38 - 126 U/L   Total Bilirubin 0.7 0.3 - 1.2 mg/dL   GFR, Estimated 24 (L) >60 mL/min   Anion gap 8 5 - 15  CBC  Result Value Ref Range   WBC 11.5 (H) 4.0 - 10.5 K/uL   RBC 3.01 (L) 3.87 - 5.11 MIL/uL   Hemoglobin 9.2 (L) 12.0 - 15.0 g/dL   HCT 28.2 (L) 36.0 - 46.0 %   MCV 93.7 80.0 - 100.0 fL   MCH 30.6 26.0 - 34.0 pg   MCHC 32.6 30.0 - 36.0 g/dL   RDW 14.5 11.5 - 15.5 %   Platelets 309 150 - 400 K/uL   nRBC 0.2 0.0 - 0.2 %  POC occult blood, ED  Result Value Ref Range   Fecal Occult Bld POSITIVE (A) NEGATIVE  Type and screen [place]  Result Value Ref Range   ABO/RH(D) O POS    Antibody Screen NEG    Sample Expiration      06/15/2022,2359 Performed at [facility], [place] [address] [place], [place] [address]    DG Chest 2 View  Result Date: 06/02/2022 CLINICAL DATA:  Mental status changes.  Weakness. EXAM: CHEST - 2 VIEW COMPARISON:  04/05/2022 FINDINGS: Lateral view degraded by patient arm position. Midline trachea. Borderline cardiomegaly. Tortuous thoracic aorta. Atherosclerosis in the transverse aorta. No pleural effusion or  pneumothorax. Clear lungs. IMPRESSION: No acute cardiopulmonary disease. Aortic Atherosclerosis (ICD10-I70.0). Electronically  Signed   By: Abigail Miyamoto M.D.   On: 06/02/2022 19:10   CT Head Wo Contrast  Result Date: 06/02/2022 CLINICAL DATA:  Weakness and confusion since August 2nd. EXAM: CT HEAD WITHOUT CONTRAST TECHNIQUE: Contiguous axial images were obtained from the base of the skull through the vertex without intravenous contrast. RADIATION DOSE REDUCTION: This exam was performed according to the departmental dose-optimization program which includes automated exposure control, adjustment of the mA and/or kV according to patient size and/or use of iterative reconstruction technique. COMPARISON:  Report of a head CT of 10/16/2010 from [facility]. FINDINGS: Brain: Advanced cerebral and cerebellar atrophy. Moderate low density in the periventricular white matter likely related to small vessel disease. No mass lesion, hemorrhage, hydrocephalus, acute infarct, intra-axial, or extra-axial fluid collection. Vascular: No hyperdense vessel or unexpected calcification. Intracranial atherosclerosis. Skull: Normal Sinuses/Orbits: Normal imaged portions of the orbits and globes. Clear paranasal sinuses and mastoid air cells. Other: None. IMPRESSION: 1. No acute intracranial abnormality. 2. Cerebral/cerebellar atrophy and small vessel ischemic change. Electronically Signed   By: Abigail Miyamoto M.D.   On: 06/02/2022 19:06     Radiology No results found.  Procedures Procedures    Medications Ordered in ED Medications  sodium chloride 0.9 % bolus 1,000 mL (1,000 mLs Intravenous New Bag/Given 06/12/22 0154)    ED Course/ Medical Decision Making/ A&P                           Medical Decision Making Patient with rectal bleeding was at [facility] and they wanted to transfer to patient to [facility] and patient did not want this and left AMA to come here   Amount and/or Complexity of Data  Reviewed Independent Historian:     Details: relative see above External Data Reviewed: notes.    Details: previous notes reviewed Labs: ordered.    Details: all labs reviewed:  normal sodium and potassium elevated BUN 50 and creatinine 2.04.  low albumin 2.7.  elevated white count 11.5, hemoglobin 9.2, normal platelets Discussion of management or test interpretation with external provider(s): Secure chat sent to Dr. Benson Norway  Risk Decision regarding hospitalization.    Final Clinical Impression(s) / ED Diagnoses Final diagnoses:  Rectal bleeding   The patient appears reasonably stabilized for admission considering the current resources, flow, and capabilities available in the ED at this time, and I doubt any other [facility] requiring further screening and/or treatment in the ED prior to admission.  Rx / DC Orders ED Discharge Orders     None         Caliber Landess, MD 06/12/22 1324

## 2022-06-12 NOTE — ED Notes (Signed)
Patient noted to be less responsive when attempting to place in chair for ultrasound guided IV.  BP rechecked and 88/73.  IV started in L hand.  Patient moved to stretcher and moved to room 10.

## 2022-06-12 NOTE — Consult Note (Signed)
Reason for Consult: Hematochezia Referring Physician: [facility]  [name] HPI: This is a 81 year old female with a PMH of colonic polyp, diverticula, HTN, breast cancer, GERD, obesity, and a left leg DVT (03/2022) on Eliquis admitted for hematochezia.  Per the chart the patient was to be transferred from [facility] to [place], but she left AMA to come to [facility].  Her HGB on admission was 9.2 g/dL, which was a decline from her HGB of 11.3 g/dL 10 days prior.  With IV hydration her HGB this morning was a 8.3 g/dL.  The onset of her symptoms were heralded by crampy abdominal pain followed by hematochezia.  She had multiple episodes of bleeding before her presentation to the ER.  Currently she feels well and she denies any further hematochezia.  Her last dosing of Eliquis was yesterday.  The last colonoscopy was with Dr. Gala Romney 05/18/2013 with findings of diverticula.   Past Medical History:  Diagnosis Date   Anemia    Anxiety    Arthritis    back, shoulders ,  right hip   Breast cancer, left [facility]) oncologist-- dr Audelia Hives ([place] in [place])--- per lov in epic no recurrence   dx 05/ 2011---- Stage I (T1,N0M0), DCIS, ER+;  s/p  left breast lumpectomy ,  completed radiation therapy 06-02-2010,  started antiestrogen   Full dentures    GERD (gastroesophageal reflux disease)    History of colon polyps 2009   Dr. Collene Mares   History of external beam radiation therapy 04-21-2010  to 06-02-2010   left breast cancer   Hyperparathyroidism ([place])    per pt dx 03/ 2019,  was told by surgeon not high enough to worry about   Hypertension    Vitamin D deficiency    Wears glasses     Past Surgical History:  Procedure Laterality Date   APPLICATION OF WOUND VAC Right 10/06/2018   Procedure: APPLICATION OF WOUND VAC;  Surgeon: Rod Can, MD;  Location: [facility];  Service: Orthopedics;  Laterality: Right;   BREAST LUMPECTOMY Left 2011   BREAST LUMPECTOMY Left 05/02/2020    COLONOSCOPY N/A 05/18/2013   Procedure: COLONOSCOPY;  Surgeon: Daneil Dolin, MD;  Location: [facility];  Service: Endoscopy;  Laterality: N/A;  9:30   DILATATION & CURETTAGE/HYSTEROSCOPY WITH MYOSURE N/A 05/28/2016   Procedure: DILATATION & CURETTAGE/HYSTEROSCOPY WITH MYOSURE;  Surgeon: Servando Salina, MD;  Location: [facility];  Service: Gynecology;  Laterality: N/A;   INCISION AND DRAINAGE HIP Right 10/06/2018   Procedure: DEBRIDEMENT AND CLOSURE RIGHT HIP;  Surgeon: Rod Can, MD;  Location: [facility];  Service: Orthopedics;  Laterality: Right;   TOTAL HIP ARTHROPLASTY Right 09/15/2018   Procedure: TOTAL HIP ARTHROPLASTY ANTERIOR APPROACH;  Surgeon: Rod Can, MD;  Location: [facility];  Service: Orthopedics;  Laterality: Right;   TOTAL KNEE ARTHROPLASTY Bilateral left 05-07-2009;  right 02-07-2010   both by dr Theda Sers '@[email or web address]'$     Family History  Problem Relation Age of Onset   Breast cancer Mother    Colon cancer Neg Hx    Lung cancer Neg Hx    Ovarian cancer Neg Hx     Social History:  reports that she quit smoking about 47 years ago. Her smoking use included cigarettes. She has never used smokeless tobacco. She reports that she does not drink alcohol and does not use drugs.  Allergies:  Allergies  Allergen Reactions   Ace Inhibitors Swelling   Hydralazine Other (See Comments)  Causes acute renal failure. Hydralazine induced glomerular nephritis   Sulfa Antibiotics Swelling and Rash    Medications: Scheduled:  methylPREDNISolone (SOLU-MEDROL) injection  50 mg Intravenous Q24H   Continuous:  sodium chloride 125 mL/hr at 06/12/22 0441    Results for orders placed or performed during the hospital encounter of 06/12/22 (from the past 24 hour(s))  Comprehensive metabolic panel     Status: Abnormal   Collection Time: 06/12/22  1:42 AM  Result Value Ref Range   Sodium 140 135 - 145 mmol/L   Potassium 4.5 3.5 - 5.1 mmol/L   Chloride 105 98 - 111 mmol/L   CO2 27 22 - 32  mmol/L   Glucose, Bld 153 (H) 70 - 99 mg/dL   BUN 50 (H) 8 - 23 mg/dL   Creatinine, Ser 2.04 (H) 0.44 - 1.00 mg/dL   Calcium 9.6 8.9 - 10.3 mg/dL   Total Protein 5.5 (L) 6.5 - 8.1 g/dL   Albumin 2.7 (L) 3.5 - 5.0 g/dL   AST 24 15 - 41 U/L   ALT 57 (H) 0 - 44 U/L   Alkaline Phosphatase 58 38 - 126 U/L   Total Bilirubin 0.7 0.3 - 1.2 mg/dL   GFR, Estimated 24 (L) >60 mL/min   Anion gap 8 5 - 15  CBC     Status: Abnormal   Collection Time: 06/12/22  1:42 AM  Result Value Ref Range   WBC 11.5 (H) 4.0 - 10.5 K/uL   RBC 3.01 (L) 3.87 - 5.11 MIL/uL   Hemoglobin 9.2 (L) 12.0 - 15.0 g/dL   HCT 28.2 (L) 36.0 - 46.0 %   MCV 93.7 80.0 - 100.0 fL   MCH 30.6 26.0 - 34.0 pg   MCHC 32.6 30.0 - 36.0 g/dL   RDW 14.5 11.5 - 15.5 %   Platelets 309 150 - 400 K/uL   nRBC 0.2 0.0 - 0.2 %  Type and screen [place]     Status: None   Collection Time: 06/12/22  1:42 AM  Result Value Ref Range   ABO/RH(D) O POS    Antibody Screen NEG    Sample Expiration      06/15/2022,2359 Performed at [facility], [place] [address] [place],  [address]   POC occult blood, ED     Status: Abnormal   Collection Time: 06/12/22  1:46 AM  Result Value Ref Range   Fecal Occult Bld POSITIVE (A) NEGATIVE  CBC     Status: Abnormal   Collection Time: 06/12/22  4:40 AM  Result Value Ref Range   WBC 9.5 4.0 - 10.5 K/uL   RBC 2.69 (L) 3.87 - 5.11 MIL/uL   Hemoglobin 8.3 (L) 12.0 - 15.0 g/dL   HCT 25.2 (L) 36.0 - 46.0 %   MCV 93.7 80.0 - 100.0 fL   MCH 30.9 26.0 - 34.0 pg   MCHC 32.9 30.0 - 36.0 g/dL   RDW 14.4 11.5 - 15.5 %   Platelets 264 150 - 400 K/uL   nRBC 0.0 0.0 - 0.2 %  Comprehensive metabolic panel     Status: Abnormal   Collection Time: 06/12/22  4:40 AM  Result Value Ref Range   Sodium 142 135 - 145 mmol/L   Potassium 3.5 3.5 - 5.1 mmol/L   Chloride 107 98 - 111 mmol/L   CO2 28 22 - 32 mmol/L   Glucose, Bld 114 (H) 70 - 99 mg/dL   BUN 49 (H) 8 - 23  mg/dL    Creatinine, Ser 1.83 (H) 0.44 - 1.00 mg/dL   Calcium 9.2 8.9 - 10.3 mg/dL   Total Protein 4.7 (L) 6.5 - 8.1 g/dL   Albumin 2.4 (L) 3.5 - 5.0 g/dL   AST 20 15 - 41 U/L   ALT 50 (H) 0 - 44 U/L   Alkaline Phosphatase 52 38 - 126 U/L   Total Bilirubin 0.5 0.3 - 1.2 mg/dL   GFR, Estimated 28 (L) >60 mL/min   Anion gap 7 5 - 15     No results found.  ROS:  As stated above in the HPI otherwise negative.  Blood pressure 134/75, pulse 72, temperature 98 F (36.7 C), temperature source Oral, resp. rate 20, height '5\' 4"'$  (1.626 m), weight 92.5 kg, SpO2 98 %.    PE: Gen: NAD, weak appearing HEENT:  [place]/AT, EOMI Neck: Supple, no LAD Lungs: CTA Bilaterally CV: RRR without M/G/R ABD: Soft, NTND, +BS Ext: No C/C/E  Assessment/Plan: 1) ? Ischemic colitis. 2) Diverticula. 3) Anemia.   Her clinical presentation is consistent with an ischemic colitis.  Her last colonoscopy was 9 years ago.  She will have a repeat colonoscopy.  Her family will help her to drink the prep.  Plan: 1) Colonoscopy.  Araeya Lamb D 06/12/2022, 10:25 AM

## 2022-06-12 NOTE — H&P (Signed)
History and Physical    [name] [name]:[id number] DOB: Dec 13, 1940 DOA: 06/12/2022  PCP: Monico Blitz, MD  Patient coming from: Home  Chief Complaint: Rectal bleeding  HPI: [name] [name] is a 81 y.o. female with medical history significant of hypertension, recent acute glomerulonephritis associated with hydralazine, recent left leg DVT 03/2022 on Eliquis, history of breast cancer, obesity, GERD. Recently admitted for encephalopathy secondary to UTI.  She presents to the ED tonight complaining of rectal bleeding/bright red blood per rectum.  She was initially evaluated at [facility] and they wanted to transfer her to [facility] but patient did not want this and left AMA and came here.  Vital signs stable on arrival to the ED.  Labs showing WBC 11.5, hemoglobin 9.2 (was 13.1 on labs done 10 days ago), BUN 50, creatinine 2.0 (baseline around 1.5), calcium 9.6, total protein 5.5, albumin 2.7, ALT 57 with normal remainder of LFTs.  FOBT positive. Patient was given 1 L normal saline bolus.  ED physician sent a message to Dr. Benson Norway requesting consultation in morning.  History provided by the patient and her daughter at bedside.  Yesterday morning she started having abdominal cramping and went to the bathroom, soon after a large bloody bowel movement/bright red blood per rectum.  She continued to have multiple episodes of large-volume rectal bleeding at home.  Daughter initially took her to [facility] ED and states over there she had additional 3 episodes of rectal bleeding but no further bleeding since she has been in the emergency room here.  Patient denies any abdominal pain at this time.  She has not vomited any blood.  Reports feeling slightly dizzy earlier tonight but denies any dizziness or lightheadedness at present.  Denies chest pain or shortness of breath.  She takes Eliquis due to history of DVT and last dose was yesterday morning.  Daughter states patient was admitted to [facility] back  in June for kidney problems and anemia, she was given a blood transfusion at that time which had to be stopped due to patient experiencing shortness of breath.  Review of Systems:  Review of Systems  All other systems reviewed and are negative.   Past Medical History:  Diagnosis Date   Anemia    Anxiety    Arthritis    back, shoulders ,  right hip   Breast cancer, left [facility]) oncologist-- dr Audelia Hives ([place] in [place])--- per lov in epic no recurrence   dx 05/ 2011---- Stage I (T1,N0M0), DCIS, ER+;  s/p  left breast lumpectomy ,  completed radiation therapy 06-02-2010,  started antiestrogen   Full dentures    GERD (gastroesophageal reflux disease)    History of colon polyps 2009   Dr. Collene Mares   History of external beam radiation therapy 04-21-2010  to 06-02-2010   left breast cancer   Hyperparathyroidism ([place])    per pt dx 03/ 2019,  was told by surgeon not high enough to worry about   Hypertension    Vitamin D deficiency    Wears glasses     Past Surgical History:  Procedure Laterality Date   APPLICATION OF WOUND VAC Right 10/06/2018   Procedure: APPLICATION OF WOUND VAC;  Surgeon: Rod Can, MD;  Location: [facility];  Service: Orthopedics;  Laterality: Right;   BREAST LUMPECTOMY Left 2011   BREAST LUMPECTOMY Left 05/02/2020   COLONOSCOPY N/A 05/18/2013   Procedure: COLONOSCOPY;  Surgeon: Daneil Dolin, MD;  [facility];  Service: Endoscopy;  Laterality: N/A;  9:30   DILATATION & CURETTAGE/HYSTEROSCOPY WITH MYOSURE N/A 05/28/2016   Procedure: DILATATION & CURETTAGE/HYSTEROSCOPY WITH MYOSURE;  Surgeon: Servando Salina, MD;  Location: [facility];  Service: Gynecology;  Laterality: N/A;   INCISION AND DRAINAGE HIP Right 10/06/2018   Procedure: DEBRIDEMENT AND CLOSURE RIGHT HIP;  Surgeon: Rod Can, MD;  [facility];  Service: Orthopedics;  Laterality: Right;   TOTAL HIP ARTHROPLASTY Right 09/15/2018   Procedure: TOTAL HIP ARTHROPLASTY ANTERIOR APPROACH;   Surgeon: Rod Can, MD;  [facility];  Service: Orthopedics;  Laterality: Right;   TOTAL KNEE ARTHROPLASTY Bilateral left 05-07-2009;  right 02-07-2010   both by dr Theda Sers '@[facility]'$      reports that she quit smoking about 47 years ago. Her smoking use included cigarettes. She has never used smokeless tobacco. She reports that she does not drink alcohol and does not use drugs.  Allergies  Allergen Reactions   Ace Inhibitors Swelling   Hydralazine Other (See Comments)    Causes acute renal failure. Hydralazine induced glomerular nephritis   Sulfa Antibiotics Swelling and Rash    Family History  Problem Relation Age of Onset   Breast cancer Mother    Colon cancer Neg Hx    Lung cancer Neg Hx    Ovarian cancer Neg Hx     Prior to Admission medications   Medication Sig Start Date End Date Taking? Authorizing Provider  amLODipine (NORVASC) 10 MG tablet Take 10 mg by mouth daily. 02/26/22   [provider]  anastrozole (ARIMIDEX) 1 MG tablet Take 1 mg by mouth daily. 02/24/22   [provider]  apixaban (ELIQUIS) 5 MG TABS tablet Take 2 tablets (10 mg total) by mouth 2 (two) times daily. X 7 days, then one tab (5 mg) bid starting 04/20/22 Patient taking differently: Take 5 mg by mouth 2 (two) times daily. 04/13/22   Orson Eva, MD  Cholecalciferol 25 MCG (1000 UT) tablet Take 1,000 Units by mouth every other day.    [provider]  COMBIGAN 0.2-0.5 % ophthalmic solution Place 1 drop into both eyes every 12 (twelve) hours. 05/22/20   [provider]  famotidine (PEPCID) 10 MG tablet Take 10 mg by mouth 2 (two) times daily.    [provider]  furosemide (LASIX) 40 MG tablet Take 1 tablet (40 mg total) by mouth daily. 04/14/22   Orson Eva, MD  metoprolol tartrate (LOPRESSOR) 25 MG tablet Take 1 tablet (25 mg total) by mouth 2 (two) times daily. 06/05/22   Shelly Coss, MD  pantoprazole (PROTONIX) 40 MG tablet Take 40 mg by mouth daily. 05/29/22    [provider]  potassium chloride SA (KLOR-CON M) 20 MEQ tablet Take 2 tablets (40 mEq total) by mouth daily for 2 doses. 06/06/22 06/08/22  Shelly Coss, MD  predniSONE (DELTASONE) 20 MG tablet Take 3 tablets (60 mg total) by mouth daily. 04/14/22   Orson Eva, MD  ROCKLATAN 0.02-0.005 % SOLN Apply 1 drop to eye at bedtime. 02/23/22   [provider]    Physical Exam: Vitals:   06/12/22 0145 06/12/22 0200 06/12/22 0215 06/12/22 0245  BP: 135/71 (!) 144/88 130/85 (!) 150/75  Pulse: 70 66 63 69  Resp: (!) '22 19 15 13  '$ Temp:      TempSrc:      SpO2: 99% 96% 97% 95%  Weight:      Height:        Physical Exam Vitals reviewed.  Constitutional:  General: She is not in acute distress. HENT:     Head: Normocephalic and atraumatic.  Eyes:     Extraocular Movements: Extraocular movements intact.  Cardiovascular:     Rate and Rhythm: Normal rate and regular rhythm.     Pulses: Normal pulses.  Pulmonary:     Effort: Pulmonary effort is normal. No respiratory distress.     Breath sounds: Normal breath sounds.  Abdominal:     General: Bowel sounds are normal. There is no distension.     Palpations: Abdomen is soft.     Tenderness: There is no abdominal tenderness. There is no guarding or rebound.  Musculoskeletal:        General: No swelling or tenderness.     Cervical back: Normal range of motion.  Skin:    General: Skin is warm and dry.  Neurological:     General: No focal deficit present.     Mental Status: She is alert and oriented to person, place, and time.      Labs on Admission: I have personally reviewed following labs and imaging studies  CBC: Recent Labs  Lab 06/12/22 0142  WBC 11.5*  HGB 9.2*  HCT 28.2*  MCV 93.7  PLT 092   Basic Metabolic Panel: Recent Labs  Lab 06/05/22 0851 06/12/22 0142  NA 139 140  K 3.4* 4.5  CL 102 105  CO2 29 27  GLUCOSE 130* 153*  BUN 48* 50*  CREATININE 1.56* 2.04*  CALCIUM 10.3 9.6    GFR: Estimated Creatinine Clearance: 24.6 mL/min (A) (by C-G formula based on SCr of 2.04 mg/dL (H)). Liver Function Tests: Recent Labs  Lab 06/12/22 0142  AST 24  ALT 57*  ALKPHOS 58  BILITOT 0.7  PROT 5.5*  ALBUMIN 2.7*   No results for input(s): "LIPASE", "AMYLASE" in the last 168 hours. No results for input(s): "AMMONIA" in the last 168 hours. Coagulation Profile: No results for input(s): "INR", "PROTIME" in the last 168 hours. Cardiac Enzymes: No results for input(s): "CKTOTAL", "CKMB", "CKMBINDEX", "TROPONINI" in the last 168 hours. BNP (last 3 results) No results for input(s): "PROBNP" in the last 8760 hours. HbA1C: No results for input(s): "HGBA1C" in the last 72 hours. CBG: No results for input(s): "GLUCAP" in the last 168 hours. Lipid Profile: No results for input(s): "CHOL", "HDL", "LDLCALC", "TRIG", "CHOLHDL", "LDLDIRECT" in the last 72 hours. Thyroid Function Tests: No results for input(s): "TSH", "T4TOTAL", "FREET4", "T3FREE", "THYROIDAB" in the last 72 hours. Anemia Panel: No results for input(s): "VITAMINB12", "FOLATE", "FERRITIN", "TIBC", "IRON", "RETICCTPCT" in the last 72 hours. Urine analysis:    Component Value Date/Time   COLORURINE YELLOW 06/02/2022 1917   APPEARANCEUR HAZY (A) 06/02/2022 1917   LABSPEC 1.012 06/02/2022 1917   PHURINE 5.0 06/02/2022 1917   GLUCOSEU NEGATIVE 06/02/2022 1917   HGBUR MODERATE (A) 06/02/2022 1917   BILIRUBINUR NEGATIVE 06/02/2022 1917   KETONESUR NEGATIVE 06/02/2022 1917   PROTEINUR 100 (A) 06/02/2022 1917   UROBILINOGEN 0.2 02/03/2010 1200   NITRITE NEGATIVE 06/02/2022 1917   LEUKOCYTESUR MODERATE (A) 06/02/2022 1917    Radiological Exams on Admission: I have personally reviewed images No results found.  Assessment and Plan  Acute lower GI bleed Acute blood loss anemia Patient presented with complaints of multiple episodes of rectal bleeding/bright red blood per rectum.  Bleeding likely diverticular in  nature as prior colonoscopy done in 2014 was showing colonic diverticulosis.  She is on Eliquis due to DVT diagnosed 2 months ago.  Hemoglobin currently  9.2, was 13.1 on labs done 10 days ago.  FOBT positive.  Remains hemodynamically stable. -Hold Eliquis -Keep n.p.o. -IV fluid hydration -Type and screen -Patient and daughter concerned about her having a transfusion reaction back in June at [facility].  I have reviewed the discharge summary from this hospitalization and it is mentioned that patient did receive 1 unit of PRBCs and her dyspnea had improved, no transfusion reaction mentioned.  Patient mildly symptomatic from her anemia, reports mild dizziness earlier tonight but denies any symptoms at present.  Denies chest pain or shortness of breath.  Continue to monitor H&H and transfuse PRBCs if hemoglobin less than 7 (patient and daughter in agreement). -GI consulted  AKI on CKD stage IIIb Likely prerenal in the setting of acute GI bleed.  BUN 50, creatinine 2.0 (baseline around 1.5).   -IV fluid hydration -Monitor renal function and avoid nephrotoxic agents/hold home Lasix  Recent acute glomerulonephritis Felt to be due to hydralazine which has been discontinued. -Continue steroid after pharmacy med rec is done.  Mild leukocytosis Possibly reactive.  No fever or signs of sepsis. -Monitor CBC  Borderline hypercalcemia Calcium 10.6 when corrected for hypoalbuminemia. -IV fluid hydration, continue to monitor  Hypertension -Hold antihypertensives in the setting of acute GI bleed  Recent left leg DVT in June 2023 -Hold Eliquis at this time given acute GI bleed  DVT prophylaxis:  Hold Eliquis at this time. Code Status: Full Code (discussed with the patient) Family Communication: Daughter at bedside. Consults called: GI Level of care: Telemetry bed Admission status: It is my clinical opinion that referral for OBSERVATION is reasonable and necessary in this patient based on the above  information provided. The aforementioned taken together are felt to place the patient at high risk for further clinical deterioration. However, it is anticipated that the patient may be medically stable for discharge from the hospital within 24 to 48 hours.   Shela Leff MD [facility]  If 7PM-7AM, please contact night-coverage [email or web address]  06/12/2022, 3:27 AM

## 2022-06-12 NOTE — ED Triage Notes (Signed)
Patient coming to ED for evaluation of rectal bleeding.  Daughter reports patient has had multiple episodes of rectal bleeding today.  Has c/o abdominal cramps.  Seen at [facility] and dx with lower GI bleed.  Daughter reports "they wanted to transfer her to [place], but they said it might be two days.  She had two more episodes of bleeding and they did not do anything.  They told me her hemoglobin was dropping.  I couldn't let her just sit there so I brought her here."

## 2022-06-12 NOTE — H&P (View-Only) (Signed)
Reason for Consult: Hematochezia Referring Physician: [facility]  [name] HPI: This is a 81 year old female with a PMH of colonic polyp, diverticula, HTN, breast cancer, GERD, obesity, and a left leg DVT (03/2022) on Eliquis admitted for hematochezia.  Per the chart the patient was to be transferred from [facility] to [place], but she left AMA to come to [facility].  Her HGB on admission was 9.2 g/dL, which was a decline from her HGB of 11.3 g/dL 10 days prior.  With IV hydration her HGB this morning was a 8.3 g/dL.  The onset of her symptoms were heralded by crampy abdominal pain followed by hematochezia.  She had multiple episodes of bleeding before her presentation to the ER.  Currently she feels well and she denies any further hematochezia.  Her last dosing of Eliquis was yesterday.  The last colonoscopy was with Dr. Gala Romney 05/18/2013 with findings of diverticula.   Past Medical History:  Diagnosis Date   Anemia    Anxiety    Arthritis    back, shoulders ,  right hip   Breast cancer, left [facility]) oncologist-- dr Audelia Hives ([place] in [place])--- per lov in epic no recurrence   dx 05/ 2011---- Stage I (T1,N0M0), DCIS, ER+;  s/p  left breast lumpectomy ,  completed radiation therapy 06-02-2010,  started antiestrogen   Full dentures    GERD (gastroesophageal reflux disease)    History of colon polyps 2009   Dr. Collene Mares   History of external beam radiation therapy 04-21-2010  to 06-02-2010   left breast cancer   Hyperparathyroidism ([place])    per pt dx 03/ 2019,  was told by surgeon not high enough to worry about   Hypertension    Vitamin D deficiency    Wears glasses     Past Surgical History:  Procedure Laterality Date   APPLICATION OF WOUND VAC Right 10/06/2018   Procedure: APPLICATION OF WOUND VAC;  Surgeon: Rod Can, MD;  [facility];  Service: Orthopedics;  Laterality: Right;   BREAST LUMPECTOMY Left 2011   BREAST LUMPECTOMY Left 05/02/2020    COLONOSCOPY N/A 05/18/2013   Procedure: COLONOSCOPY;  Surgeon: Daneil Dolin, MD;  [facility];  Service: Endoscopy;  Laterality: N/A;  9:30   DILATATION & CURETTAGE/HYSTEROSCOPY WITH MYOSURE N/A 05/28/2016   Procedure: DILATATION & CURETTAGE/HYSTEROSCOPY WITH MYOSURE;  Surgeon: Servando Salina, MD;  Location: [place] [facility];  Service: Gynecology;  Laterality: N/A;   INCISION AND DRAINAGE HIP Right 10/06/2018   Procedure: DEBRIDEMENT AND CLOSURE RIGHT HIP;  Surgeon: Rod Can, MD;  [facility];  Service: Orthopedics;  Laterality: Right;   TOTAL HIP ARTHROPLASTY Right 09/15/2018   Procedure: TOTAL HIP ARTHROPLASTY ANTERIOR APPROACH;  Surgeon: Rod Can, MD;  [facility];  Service: Orthopedics;  Laterality: Right;   TOTAL KNEE ARTHROPLASTY Bilateral left 05-07-2009;  right 02-07-2010   both by dr Theda Sers '@[email or web address]'$     Family History  Problem Relation Age of Onset   Breast cancer Mother    Colon cancer Neg Hx    Lung cancer Neg Hx    Ovarian cancer Neg Hx     Social History:  reports that she quit smoking about 47 years ago. Her smoking use included cigarettes. She has never used smokeless tobacco. She reports that she does not drink alcohol and does not use drugs.  Allergies:  Allergies  Allergen Reactions   Ace Inhibitors Swelling   Hydralazine Other (See Comments)  Causes acute renal failure. Hydralazine induced glomerular nephritis   Sulfa Antibiotics Swelling and Rash    Medications: Scheduled:  methylPREDNISolone (SOLU-MEDROL) injection  50 mg Intravenous Q24H   Continuous:  sodium chloride 125 mL/hr at 06/12/22 0441    Results for orders placed or performed during the hospital encounter of 06/12/22 (from the past 24 hour(s))  Comprehensive metabolic panel     Status: Abnormal   Collection Time: 06/12/22  1:42 AM  Result Value Ref Range   Sodium 140 135 - 145 mmol/L   Potassium 4.5 3.5 - 5.1 mmol/L   Chloride 105 98 - 111 mmol/L   CO2 27 22 - 32  mmol/L   Glucose, Bld 153 (H) 70 - 99 mg/dL   BUN 50 (H) 8 - 23 mg/dL   Creatinine, Ser 2.04 (H) 0.44 - 1.00 mg/dL   Calcium 9.6 8.9 - 10.3 mg/dL   Total Protein 5.5 (L) 6.5 - 8.1 g/dL   Albumin 2.7 (L) 3.5 - 5.0 g/dL   AST 24 15 - 41 U/L   ALT 57 (H) 0 - 44 U/L   Alkaline Phosphatase 58 38 - 126 U/L   Total Bilirubin 0.7 0.3 - 1.2 mg/dL   GFR, Estimated 24 (L) >60 mL/min   Anion gap 8 5 - 15  CBC     Status: Abnormal   Collection Time: 06/12/22  1:42 AM  Result Value Ref Range   WBC 11.5 (H) 4.0 - 10.5 K/uL   RBC 3.01 (L) 3.87 - 5.11 MIL/uL   Hemoglobin 9.2 (L) 12.0 - 15.0 g/dL   HCT 28.2 (L) 36.0 - 46.0 %   MCV 93.7 80.0 - 100.0 fL   MCH 30.6 26.0 - 34.0 pg   MCHC 32.6 30.0 - 36.0 g/dL   RDW 14.5 11.5 - 15.5 %   Platelets 309 150 - 400 K/uL   nRBC 0.2 0.0 - 0.2 %  Type and screen [place]     Status: None   Collection Time: 06/12/22  1:42 AM  Result Value Ref Range   ABO/RH(D) O POS    Antibody Screen NEG    Sample Expiration      06/15/2022,2359 Performed at [facility], [place] [address], [place], [place] [address]   POC occult blood, ED     Status: Abnormal   Collection Time: 06/12/22  1:46 AM  Result Value Ref Range   Fecal Occult Bld POSITIVE (A) NEGATIVE  CBC     Status: Abnormal   Collection Time: 06/12/22  4:40 AM  Result Value Ref Range   WBC 9.5 4.0 - 10.5 K/uL   RBC 2.69 (L) 3.87 - 5.11 MIL/uL   Hemoglobin 8.3 (L) 12.0 - 15.0 g/dL   HCT 25.2 (L) 36.0 - 46.0 %   MCV 93.7 80.0 - 100.0 fL   MCH 30.9 26.0 - 34.0 pg   MCHC 32.9 30.0 - 36.0 g/dL   RDW 14.4 11.5 - 15.5 %   Platelets 264 150 - 400 K/uL   nRBC 0.0 0.0 - 0.2 %  Comprehensive metabolic panel     Status: Abnormal   Collection Time: 06/12/22  4:40 AM  Result Value Ref Range   Sodium 142 135 - 145 mmol/L   Potassium 3.5 3.5 - 5.1 mmol/L   Chloride 107 98 - 111 mmol/L   CO2 28 22 - 32 mmol/L   Glucose, Bld 114 (H) 70 - 99 mg/dL   BUN 49 (H) 8 - 23  mg/dL    Creatinine, Ser 1.83 (H) 0.44 - 1.00 mg/dL   Calcium 9.2 8.9 - 10.3 mg/dL   Total Protein 4.7 (L) 6.5 - 8.1 g/dL   Albumin 2.4 (L) 3.5 - 5.0 g/dL   AST 20 15 - 41 U/L   ALT 50 (H) 0 - 44 U/L   Alkaline Phosphatase 52 38 - 126 U/L   Total Bilirubin 0.5 0.3 - 1.2 mg/dL   GFR, Estimated 28 (L) >60 mL/min   Anion gap 7 5 - 15     No results found.  ROS:  As stated above in the HPI otherwise negative.  Blood pressure 134/75, pulse 72, temperature 98 F (36.7 C), temperature source Oral, resp. rate 20, height '5\' 4"'$  (1.626 m), weight 92.5 kg, SpO2 98 %.    PE: Gen: NAD, weak appearing HEENT:  [place]/AT, EOMI Neck: Supple, no LAD Lungs: CTA Bilaterally CV: RRR without M/G/R ABD: Soft, NTND, +BS Ext: No C/C/E  Assessment/Plan: 1) ? Ischemic colitis. 2) Diverticula. 3) Anemia.   Her clinical presentation is consistent with an ischemic colitis.  Her last colonoscopy was 9 years ago.  She will have a repeat colonoscopy.  Her family will help her to drink the prep.  Plan: 1) Colonoscopy.  Keaisha Sublette D 06/12/2022, 10:25 AM

## 2022-06-13 ENCOUNTER — Encounter (HOSPITAL_COMMUNITY): Payer: Self-pay | Admitting: Internal Medicine

## 2022-06-13 ENCOUNTER — Observation Stay (HOSPITAL_COMMUNITY): Payer: Medicare Other | Admitting: Certified Registered Nurse Anesthetist

## 2022-06-13 ENCOUNTER — Encounter (HOSPITAL_COMMUNITY)
Admission: EM | Disposition: A | Discharge status: Home Health | Payer: Self-pay | Source: Home / Self Care | Attending: Family Medicine

## 2022-06-13 DIAGNOSIS — I10 Essential (primary) hypertension: Secondary | ICD-10-CM | POA: Diagnosis not present

## 2022-06-13 DIAGNOSIS — Z7952 Long term (current) use of systemic steroids: Secondary | ICD-10-CM | POA: Diagnosis not present

## 2022-06-13 DIAGNOSIS — N1832 Chronic kidney disease, stage 3b: Secondary | ICD-10-CM | POA: Diagnosis not present

## 2022-06-13 DIAGNOSIS — Z87891 Personal history of nicotine dependence: Secondary | ICD-10-CM | POA: Diagnosis not present

## 2022-06-13 DIAGNOSIS — K922 Gastrointestinal hemorrhage, unspecified: Secondary | ICD-10-CM | POA: Diagnosis not present

## 2022-06-13 DIAGNOSIS — Z803 Family history of malignant neoplasm of breast: Secondary | ICD-10-CM | POA: Diagnosis not present

## 2022-06-13 DIAGNOSIS — K219 Gastro-esophageal reflux disease without esophagitis: Secondary | ICD-10-CM | POA: Diagnosis present

## 2022-06-13 DIAGNOSIS — Z79899 Other long term (current) drug therapy: Secondary | ICD-10-CM | POA: Diagnosis not present

## 2022-06-13 DIAGNOSIS — K5792 Diverticulitis of intestine, part unspecified, without perforation or abscess without bleeding: Secondary | ICD-10-CM | POA: Diagnosis not present

## 2022-06-13 DIAGNOSIS — I82402 Acute embolism and thrombosis of unspecified deep veins of left lower extremity: Secondary | ICD-10-CM | POA: Diagnosis not present

## 2022-06-13 DIAGNOSIS — Z853 Personal history of malignant neoplasm of breast: Secondary | ICD-10-CM | POA: Diagnosis not present

## 2022-06-13 DIAGNOSIS — Z96641 Presence of right artificial hip joint: Secondary | ICD-10-CM | POA: Diagnosis present

## 2022-06-13 DIAGNOSIS — K5730 Diverticulosis of large intestine without perforation or abscess without bleeding: Secondary | ICD-10-CM

## 2022-06-13 DIAGNOSIS — K5733 Diverticulitis of large intestine without perforation or abscess with bleeding: Secondary | ICD-10-CM | POA: Diagnosis not present

## 2022-06-13 DIAGNOSIS — K6389 Other specified diseases of intestine: Secondary | ICD-10-CM

## 2022-06-13 DIAGNOSIS — D649 Anemia, unspecified: Secondary | ICD-10-CM | POA: Diagnosis not present

## 2022-06-13 DIAGNOSIS — T45515A Adverse effect of anticoagulants, initial encounter: Secondary | ICD-10-CM | POA: Diagnosis present

## 2022-06-13 DIAGNOSIS — I129 Hypertensive chronic kidney disease with stage 1 through stage 4 chronic kidney disease, or unspecified chronic kidney disease: Secondary | ICD-10-CM | POA: Diagnosis present

## 2022-06-13 DIAGNOSIS — Z7901 Long term (current) use of anticoagulants: Secondary | ICD-10-CM | POA: Diagnosis not present

## 2022-06-13 DIAGNOSIS — K649 Unspecified hemorrhoids: Secondary | ICD-10-CM

## 2022-06-13 DIAGNOSIS — E669 Obesity, unspecified: Secondary | ICD-10-CM | POA: Diagnosis present

## 2022-06-13 DIAGNOSIS — K5731 Diverticulosis of large intestine without perforation or abscess with bleeding: Secondary | ICD-10-CM | POA: Diagnosis not present

## 2022-06-13 DIAGNOSIS — D6832 Hemorrhagic disorder due to extrinsic circulating anticoagulants: Secondary | ICD-10-CM | POA: Diagnosis present

## 2022-06-13 DIAGNOSIS — K625 Hemorrhage of anus and rectum: Secondary | ICD-10-CM | POA: Diagnosis not present

## 2022-06-13 DIAGNOSIS — D62 Acute posthemorrhagic anemia: Secondary | ICD-10-CM

## 2022-06-13 DIAGNOSIS — D72829 Elevated white blood cell count, unspecified: Secondary | ICD-10-CM | POA: Diagnosis present

## 2022-06-13 DIAGNOSIS — N179 Acute kidney failure, unspecified: Secondary | ICD-10-CM | POA: Diagnosis present

## 2022-06-13 DIAGNOSIS — K6289 Other specified diseases of anus and rectum: Secondary | ICD-10-CM | POA: Diagnosis not present

## 2022-06-13 DIAGNOSIS — F419 Anxiety disorder, unspecified: Secondary | ICD-10-CM | POA: Diagnosis present

## 2022-06-13 DIAGNOSIS — Z96653 Presence of artificial knee joint, bilateral: Secondary | ICD-10-CM | POA: Diagnosis present

## 2022-06-13 DIAGNOSIS — Z888 Allergy status to other drugs, medicaments and biological substances status: Secondary | ICD-10-CM | POA: Diagnosis not present

## 2022-06-13 DIAGNOSIS — Z6835 Body mass index (BMI) 35.0-35.9, adult: Secondary | ICD-10-CM | POA: Diagnosis not present

## 2022-06-13 DIAGNOSIS — Z882 Allergy status to sulfonamides status: Secondary | ICD-10-CM | POA: Diagnosis not present

## 2022-06-13 DIAGNOSIS — K5791 Diverticulosis of intestine, part unspecified, without perforation or abscess with bleeding: Secondary | ICD-10-CM

## 2022-06-13 DIAGNOSIS — K921 Melena: Secondary | ICD-10-CM | POA: Diagnosis not present

## 2022-06-13 DIAGNOSIS — E8809 Other disorders of plasma-protein metabolism, not elsewhere classified: Secondary | ICD-10-CM | POA: Diagnosis present

## 2022-06-13 HISTORY — PX: COLONOSCOPY WITH PROPOFOL: SHX5780

## 2022-06-13 LAB — CBC
HCT: 25.8 % — ABNORMAL LOW (ref 36.0–46.0)
Hemoglobin: 8.2 g/dL — ABNORMAL LOW (ref 12.0–15.0)
MCH: 30.8 pg (ref 26.0–34.0)
MCHC: 31.8 g/dL (ref 30.0–36.0)
MCV: 97 fL (ref 80.0–100.0)
Platelets: 241 10*3/uL (ref 150–400)
RBC: 2.66 MIL/uL — ABNORMAL LOW (ref 3.87–5.11)
RDW: 14.7 % (ref 11.5–15.5)
WBC: 9.5 10*3/uL (ref 4.0–10.5)
nRBC: 0.2 % (ref 0.0–0.2)

## 2022-06-13 LAB — BASIC METABOLIC PANEL
Anion gap: 7 (ref 5–15)
BUN: 33 mg/dL — ABNORMAL HIGH (ref 8–23)
CO2: 27 mmol/L (ref 22–32)
Calcium: 9.5 mg/dL (ref 8.9–10.3)
Chloride: 109 mmol/L (ref 98–111)
Creatinine, Ser: 1.35 mg/dL — ABNORMAL HIGH (ref 0.44–1.00)
GFR, Estimated: 40 mL/min — ABNORMAL LOW (ref >60)
Glucose, Bld: 115 mg/dL — ABNORMAL HIGH (ref 70–99)
Potassium: 3.5 mmol/L (ref 3.5–5.1)
Sodium: 143 mmol/L (ref 135–145)

## 2022-06-13 SURGERY — COLONOSCOPY WITH PROPOFOL
Anesthesia: Monitor Anesthesia Care

## 2022-06-13 MED ORDER — LATANOPROST 0.005 % OP SOLN
1.0000 [drp] | Freq: Every day | OPHTHALMIC | Status: DC
  Filled 2022-06-13: qty 2.5

## 2022-06-13 MED ORDER — CIPROFLOXACIN IN D5W 400 MG/200ML IV SOLN
400.0000 mg | INTRAVENOUS | Status: DC
  Administered 2022-06-13: 400 mg via INTRAVENOUS
  Filled 2022-06-13: qty 200

## 2022-06-13 MED ORDER — SODIUM CHLORIDE 0.9 % IV SOLN: INTRAVENOUS | Status: DC

## 2022-06-13 MED ORDER — PHENYLEPHRINE HCL (PRESSORS) 10 MG/ML IV SOLN
INTRAVENOUS | Status: DC | PRN
  Administered 2022-06-13: 80 ug via INTRAVENOUS

## 2022-06-13 MED ORDER — NETARSUDIL-LATANOPROST 0.02-0.005 % OP SOLN
1.0000 [drp] | Freq: Every day | OPHTHALMIC | Status: DC
  Administered 2022-06-13 – 2022-06-15 (×3): 1 [drp] via OPHTHALMIC

## 2022-06-13 MED ORDER — BRIMONIDINE TARTRATE 0.2 % OP SOLN
1.0000 [drp] | Freq: Two times a day (BID) | OPHTHALMIC | Status: DC
  Administered 2022-06-13 – 2022-06-16 (×7): 1 [drp] via OPHTHALMIC
  Filled 2022-06-13: qty 5

## 2022-06-13 MED ORDER — BRIMONIDINE TARTRATE-TIMOLOL 0.2-0.5 % OP SOLN: 1.0000 [drp] | Freq: Two times a day (BID) | OPHTHALMIC | Status: DC

## 2022-06-13 MED ORDER — METRONIDAZOLE 500 MG/100ML IV SOLN
500.0000 mg | Freq: Two times a day (BID) | INTRAVENOUS | Status: DC
  Administered 2022-06-13 – 2022-06-16 (×7): 500 mg via INTRAVENOUS
  Filled 2022-06-13 (×7): qty 100

## 2022-06-13 MED ORDER — CIPROFLOXACIN IN D5W 400 MG/200ML IV SOLN
400.0000 mg | Freq: Two times a day (BID) | INTRAVENOUS | Status: DC
  Administered 2022-06-13 – 2022-06-16 (×6): 400 mg via INTRAVENOUS
  Filled 2022-06-13 (×6): qty 200

## 2022-06-13 MED ORDER — PROPOFOL 500 MG/50ML IV EMUL
INTRAVENOUS | Status: DC | PRN
  Administered 2022-06-13: 150 ug/kg/min via INTRAVENOUS

## 2022-06-13 MED ORDER — NETARSUDIL-LATANOPROST 0.02-0.005 % OP SOLN
1.0000 [drp] | Freq: Every day | OPHTHALMIC | Status: DC
Start: 2022-06-13 — End: 2022-06-13

## 2022-06-13 MED ORDER — LACTATED RINGERS IV SOLN: INTRAVENOUS | Status: DC

## 2022-06-13 MED ORDER — DEXTROSE-NACL 5-0.45 % IV SOLN: INTRAVENOUS | Status: DC

## 2022-06-13 MED ORDER — TIMOLOL MALEATE 0.5 % OP SOLN
1.0000 [drp] | Freq: Two times a day (BID) | OPHTHALMIC | Status: DC
  Administered 2022-06-13 – 2022-06-16 (×7): 1 [drp] via OPHTHALMIC
  Filled 2022-06-13: qty 5

## 2022-06-13 SURGICAL SUPPLY — 22 items

## 2022-06-13 NOTE — Op Note (Signed)
[facility] Patient Name: [name] [name] Procedure Date: 06/13/2022 MRN: [id number] Attending MD: Estill Cotta. Loletha Carrow , MD Date of Birth: 1941/05/09 CSN: [id number] Age: 81 Admit Type: Inpatient Procedure:                Colonoscopy Indications:              Lower abdominal pain, Hematochezia, Acute post                            hemorrhagic anemia                           Suspected ischemic colitis                           no abdominal imaging since admission. Patient                            reports abdominal pain significantly improved today                            and exam without appreciable abdominal tenderness                            (though patient fatigued and somnolent from acute                            illness and bowel preparation) Providers:                Estill Cotta. Loletha Carrow, MD, Jaci Carrel, RN, Frazier Richards, Technician Referring MD:             [facility] Medicines:                Monitored Anesthesia Care Complications:            No immediate complications. Estimated Blood Loss:     Estimated blood loss: none. Procedure:                Pre-Anesthesia Assessment:                           - Prior to the procedure, a History and Physical                            was performed, and patient medications and                            allergies were reviewed. The patient's tolerance of                            previous anesthesia was also reviewed. The risks                            and benefits of the procedure and the sedation  options and risks were discussed with the patient.                            All questions were answered, and informed consent                            was obtained. Prior Anticoagulants: The patient has                            taken Eliquis (apixaban), last dose was 2 days                            prior to procedure. ASA Grade Assessment: III - A                             patient with severe systemic disease. After                            reviewing the risks and benefits, the patient was                            deemed in satisfactory condition to undergo the                            procedure.                           After obtaining informed consent, the colonoscope                            was passed under direct vision. Throughout the                            procedure, the patient's blood pressure, pulse, and                            oxygen saturations were monitored continuously. The                            CF-HQ190L ([id number]) Olympus colonoscope was                            introduced through the anus and advanced to the the                            cecum, identified by appendiceal orifice and                            ileocecal valve. The colonoscopy was performed                            without difficulty. The patient tolerated the  procedure well. The quality of the bowel                            preparation was fair despite lavage. The ileocecal                            valve, appendiceal orifice, and rectum were                            photographed. Scope In: 9:24:46 AM Scope Out: 9:37:55 AM Scope Withdrawal Time: 0 hours 10 minutes 9 seconds  Total Procedure Duration: 0 hours 13 minutes 9 seconds  Findings:      The digital rectal exam findings include decreased sphincter tone.      Many diverticula were found in the entire colon.      A localized area of mildly erythematous mucosa was found in the sigmoid       colon.This was adjacent to a diverticular opening. There was also       purulent discharge from that diverticulum. It recurred several times       during a period of observation after repeated lavage. There was no fresh       or old blood in the colon.      The exam was otherwise without abnormality on direct and retroflexion        views. Impression:               - Preparation of the colon was fair.                           - Decreased sphincter tone found on digital rectal                            exam.                           - Diverticulosis in the entire examined colon.                           - Erythematous mucosa in the sigmoid colon.                            Purulent discharge from a diverticular opening                            indicating acute diverticulitis.                           - The examination was otherwise normal on direct                            and retroflexion views.                           - No specimens collected.                           No ischemic colitis seen. Findings indicate acute  diverticulitis. Unknown if that diverticulum is                            also the source of recent bleeding, but seems                            likely. Moderate Sedation:      MAC sedation used Recommendation:           - Return patient to hospital ward for ongoing care.                           - Full liquid diet.                           - Flagyl (metronidazole) 500 mg IV loading dose                            followed by 500 mg IV q 12 hr.                           - Cipro (ciprofloxacin) 400 mg IV q 24 hr. (GFR 28)                           - CBC tomorrow AM                           If recurrent hematochezia, obtain stat CBC and                            tagged RBC scan (elevated creatinine precludes IV                            contrast for CTA)                           - The findings and recommendations were discussed                            with the patient's family (daughter [name] [name] by                            phone). Procedure Code(s):        --- Professional ---                           [phone], Colonoscopy, flexible; diagnostic, including                            collection of specimen(s) by brushing or washing,                             when performed (separate procedure) Diagnosis Code(s):        --- Professional ---  K62.89, Other specified diseases of anus and rectum                           K63.89, Other specified diseases of intestine                           R10.30, Lower abdominal pain, unspecified                           K92.1, Melena (includes Hematochezia)                           D62, Acute posthemorrhagic anemia                           K57.30, Diverticulosis of large intestine without                            perforation or abscess without bleeding CPT copyright 2019 American [employer]. All rights reserved. The codes documented in this report are preliminary and upon coder review may  be revised to meet current compliance requirements. Nazareth Kirk L. Loletha Carrow, MD 06/13/2022 9:58:46 AM This report has been signed electronically. Number of Addenda: 0

## 2022-06-13 NOTE — Transfer of Care (Signed)
Immediate Anesthesia Transfer of Care Note  Patient: [name] [name]  Procedure(s) Performed: COLONOSCOPY WITH PROPOFOL  Patient Location: PACU  Anesthesia Type:MAC  Level of Consciousness: sedated, patient cooperative and responds to stimulation  Airway & Oxygen Therapy: Patient Spontanous Breathing and Patient connected to face mask oxygen  Post-op Assessment: Report given to RN and Post -op Vital signs reviewed and stable  Post vital signs: Reviewed and stable  Last Vitals:  Vitals Value Taken Time  BP    Temp    Pulse 55 06/13/22 0945  Resp 11 06/13/22 0945  SpO2 100 % 06/13/22 0945  Vitals shown include unvalidated device data.  Last Pain:  Vitals:   06/13/22 0905  TempSrc: Temporal  PainSc: 0-No pain         Complications: No notable events documented.

## 2022-06-13 NOTE — Anesthesia Preprocedure Evaluation (Signed)
Anesthesia Evaluation  Patient identified by MRN, date of birth, ID band Patient awake    Reviewed: Allergy & Precautions, NPO status , Patient's Chart, lab work & pertinent test results  Airway Mallampati: II       Dental  (+) Upper Dentures, Lower Dentures   Pulmonary neg pulmonary ROS, former smoker,    Pulmonary exam normal        Cardiovascular hypertension, Pt. on medications and Pt. on home beta blockers  Rhythm:Regular Rate:Normal     Neuro/Psych Anxiety negative neurological ROS     GI/Hepatic Neg liver ROS, GERD  Medicated,  Endo/Other  negative endocrine ROS  Renal/GU   negative genitourinary   Musculoskeletal  (+) Arthritis , Osteoarthritis,    Abdominal Normal abdominal exam  (+)   Peds  Hematology  (+) Blood dyscrasia, anemia , Lab Results      Component                Value               Date                      WBC                      11.0 (H)            06/12/2022                HGB                      10.0 (L)            06/12/2022                HCT                      30.0 (L)            06/12/2022                MCV                      92.3                06/12/2022                PLT                      324                 06/12/2022           Lab Results      Component                Value               Date                      NA                       142                 06/12/2022                K  3.5                 06/12/2022                CO2                      28                  06/12/2022                GLUCOSE                  114 (H)             06/12/2022                BUN                      49 (H)              06/12/2022                CREATININE               1.83 (H)            06/12/2022                CALCIUM                  9.2                 06/12/2022                GFRNONAA                 28 (L)              06/12/2022              Anesthesia Other Findings   Reproductive/Obstetrics                             Anesthesia Physical Anesthesia Plan  ASA: 2  Anesthesia Plan: MAC   Post-op Pain Management:    Induction: Intravenous  PONV Risk Score and Plan: 2 and Propofol infusion and Treatment may vary due to age or medical condition  Airway Management Planned: Simple Face Mask, Natural Airway and Nasal Cannula  Additional Equipment: None  Intra-op Plan:   Post-operative Plan:   Informed Consent: I have reviewed the patients History and Physical, chart, labs and discussed the procedure including the risks, benefits and alternatives for the proposed anesthesia with the patient or authorized representative who has indicated his/her understanding and acceptance.     Dental advisory given  Plan Discussed with: CRNA  Anesthesia Plan Comments:         Anesthesia Quick Evaluation

## 2022-06-13 NOTE — Interval H&P Note (Signed)
History and Physical Interval Note:  06/13/2022 8:56 AM  [name] [name]  has presented today for surgery, with the diagnosis of Hematochezia and anemia.  The various methods of treatment have been discussed with the patient and family. After consideration of risks, benefits and other options for treatment, the patient has consented to  Procedure(s): COLONOSCOPY WITH PROPOFOL (N/A) as a surgical intervention.  The patient's history has been reviewed, patient examined, no change in status, stable for surgery.  I have reviewed the patient's chart and labs.  Questions were answered to the patient's satisfaction.    Signout received from Dr. Benson Norway.  Abdominal pain and hematochezia with clinical concern for ischemic colitis.  BMP stable and hemoglobin 10.0 this morning.  Patient limited historian, no reported clinical events overnight.  Nelida Meuse III

## 2022-06-13 NOTE — Progress Notes (Signed)
PHARMACY NOTE:  ANTIMICROBIAL RENAL DOSAGE ADJUSTMENT  Current antimicrobial regimen includes a mismatch between antimicrobial dosage and estimated renal function.  As per policy approved by the Pharmacy & Therapeutics and Medical Executive Committees, the antimicrobial dosage will be adjusted accordingly.  Current antimicrobial dosage:  ciprofloxacin '400mg'$  IV q24 hours  Indication: diverticulitis  Renal Function:  Estimated Creatinine Clearance: 37.2 mL/min (A) (by C-G formula based on SCr of 1.35 mg/dL (H)). '[]'$      On intermittent HD, scheduled: '[]'$      On CRRT    Antimicrobial dosage has been changed to:  ciprofloxacin '400mg'$  IV q12 hours for CrCl > 30 mL/min  Additional comments:   Thank you for allowing pharmacy to be a part of this patient's care.  Dimple Nanas, PharmD, BCPS 06/13/2022 4:59 PM

## 2022-06-13 NOTE — Progress Notes (Addendum)
[facility]  PROGRESS NOTE  [name] [name]:[id number] DOB: 02-12-1941 DOA: 06/12/2022 PCP: Monico Blitz, MD   Brief HPI:   81 year old female with medical history of hypertension, recent acute AGN associated with hydralazine, recent left leg DVT in June 2023 on Eliquis, history of breast cancer, obesity, GERD recently mated for encephalopathy due to UTI.  She came to ED with complaints of rectal bleeding per rectum.  She was initially seen at [facility] and they wanted to transfer to [facility] but patient left AMA and came to [facility] long ED.  In the ED hemoglobin was 9.2, FOBT was positive. GI consultation was obtained. Patient does have history of diverticulosis in the past    Subjective   Patient seen and examined, underwent colonoscopy today which showed a 2 diverticulitis.  Patient started on ciprofloxacin and Flagyl.  No active bleeding noted on the colonoscopy.   Assessment/Plan:    Rectal bleeding -Resolved -Colonoscopy shows findings of acute diverticulitis; started on ceftriaxone and Flagyl -Eliquis is currently on hold -Bleeding has stopped -GI following  Acute diverticulitis -Seen on colonoscopy as above -Started on ciprofloxacin and Flagyl   Acute blood loss anemia -Secondary to above -Patient's hemoglobin was 13.1, 10 days ago -Presented with hemoglobin of 9.2; dropped to 8.2 this morning -Follow CBC in a.m.   Recent left lower extremity DVT in June 2023 -Eliquis on hold due to above   Hypertension -Antihypertensive medications on hold in the setting of acute GI bleed   Recent diagnosis of acute glomerulonephritis -Felt to be due to hydralazine which has been discontinued -Patient has been on steroids, prednisone 60 mg daily -Continue IV methylprednisolone 50 mg daily -Discussed with nephrology, Dr. Jonnie Finner will discuss with Dr. Moshe Cipro; in the meantime he recommends to continue with prednisone   AKI on CKD stage IIIb -Likely prerenal  in the setting of acute GI bleed.  BUN 50, creatinine 2.0 (baseline around 1.5).   -Creatinine is down to 1.35 -Patient also has history of hydralazine induced glomerulonephritis -IV fluid hydration -Monitor renal function and avoid nephrotoxic agents/hold home Lasix         Medications     brimonidine  1 drop Both Eyes BID   And   timolol  1 drop Both Eyes BID   methylPREDNISolone (SOLU-MEDROL) injection  50 mg Intravenous Q24H   Netarsudil-Latanoprost  1 drop Ophthalmic QHS     Data Reviewed:   CBG:  No results for input(s): "GLUCAP" in the last 168 hours.  SpO2: 99 % O2 Flow Rate (L/min): 6 L/min    Vitals:   06/13/22 1009 06/13/22 1010 06/13/22 1030 06/13/22 1444  BP: 132/60 (!) 151/51 (!) 149/57 (!) 147/69  Pulse: (!) 51 (!) 53 (!) 59 62  Resp: '18 15 14 12  '$ Temp:   98 F (36.7 C) 97.9 F (36.6 C)  TempSrc:   Oral Oral  SpO2: 95% 95% 100% 99%  Weight:      Height:          Data Reviewed:  Basic Metabolic Panel: Recent Labs  Lab 06/12/22 0142 06/12/22 0440 06/13/22 1321  NA 140 142 143  K 4.5 3.5 3.5  CL 105 107 109  CO2 '27 28 27  '$ GLUCOSE 153* 114* 115*  BUN 50* 49* 33*  CREATININE 2.04* 1.83* 1.35*  CALCIUM 9.6 9.2 9.5    CBC: Recent Labs  Lab 06/12/22 0142 06/12/22 0440 06/12/22 2234 06/13/22 1321  WBC 11.5* 9.5 11.0* 9.5  HGB 9.2* 8.3*  10.0* 8.2*  HCT 28.2* 25.2* 30.0* 25.8*  MCV 93.7 93.7 92.3 97.0  PLT 309 264 324 241    LFT Recent Labs  Lab 06/12/22 0142 06/12/22 0440  AST 24 20  ALT 57* 50*  ALKPHOS 58 52  BILITOT 0.7 0.5  PROT 5.5* 4.7*  ALBUMIN 2.7* 2.4*     Antibiotics: Anti-infectives (From admission, onward)    Start     Dose/Rate Route Frequency Ordered Stop   06/13/22 1115  ciprofloxacin (CIPRO) IVPB 400 mg        400 mg 200 mL/hr over 60 Minutes Intravenous Every 24 hours 06/13/22 1027     06/13/22 1115  metroNIDAZOLE (FLAGYL) IVPB 500 mg        500 mg 100 mL/hr over 60 Minutes Intravenous Every 12  hours 06/13/22 1027          DVT prophylaxis: None, Eliquis on hold  Code Status: Full code  Family Communication: No family at bedside   CONSULTS gastroenterology   Objective    Physical Examination:   General-appears in no acute distress Heart-S1-S2, regular, no murmur auscultated Lungs-clear to auscultation bilaterally, no wheezing or crackles auscultated Abdomen-soft, nontender, no organomegaly Extremities-no edema in the lower extremities Neuro-alert, oriented x3, no focal deficit noted   Status is: Inpatient:            [name]   [facility] If 7PM-7AM, please contact night-coverage at [email or web address], Office  [phone]   06/13/2022, 3:50 PM  LOS: 0 days

## 2022-06-14 DIAGNOSIS — I10 Essential (primary) hypertension: Secondary | ICD-10-CM | POA: Diagnosis not present

## 2022-06-14 DIAGNOSIS — D62 Acute posthemorrhagic anemia: Secondary | ICD-10-CM | POA: Diagnosis not present

## 2022-06-14 DIAGNOSIS — K921 Melena: Secondary | ICD-10-CM

## 2022-06-14 DIAGNOSIS — K922 Gastrointestinal hemorrhage, unspecified: Secondary | ICD-10-CM | POA: Diagnosis not present

## 2022-06-14 DIAGNOSIS — I82402 Acute embolism and thrombosis of unspecified deep veins of left lower extremity: Secondary | ICD-10-CM | POA: Diagnosis not present

## 2022-06-14 LAB — CBC
HCT: 22.9 % — ABNORMAL LOW (ref 36.0–46.0)
Hemoglobin: 7.5 g/dL — ABNORMAL LOW (ref 12.0–15.0)
MCH: 31.3 pg (ref 26.0–34.0)
MCHC: 32.8 g/dL (ref 30.0–36.0)
MCV: 95.4 fL (ref 80.0–100.0)
Platelets: 240 10*3/uL (ref 150–400)
RBC: 2.4 MIL/uL — ABNORMAL LOW (ref 3.87–5.11)
RDW: 14.5 % (ref 11.5–15.5)
WBC: 8.1 10*3/uL (ref 4.0–10.5)
nRBC: 0.2 % (ref 0.0–0.2)

## 2022-06-14 LAB — BASIC METABOLIC PANEL
Anion gap: 4 — ABNORMAL LOW (ref 5–15)
BUN: 26 mg/dL — ABNORMAL HIGH (ref 8–23)
CO2: 27 mmol/L (ref 22–32)
Calcium: 9.3 mg/dL (ref 8.9–10.3)
Chloride: 108 mmol/L (ref 98–111)
Creatinine, Ser: 1.2 mg/dL — ABNORMAL HIGH (ref 0.44–1.00)
GFR, Estimated: 46 mL/min — ABNORMAL LOW (ref >60)
Glucose, Bld: 130 mg/dL — ABNORMAL HIGH (ref 70–99)
Potassium: 3.4 mmol/L — ABNORMAL LOW (ref 3.5–5.1)
Sodium: 139 mmol/L (ref 135–145)

## 2022-06-14 MED ORDER — SODIUM CHLORIDE 0.9 % IV SOLN
250.0000 mg | Freq: Every day | INTRAVENOUS | Status: AC
  Administered 2022-06-14 – 2022-06-15 (×2): 250 mg via INTRAVENOUS
  Filled 2022-06-14 (×2): qty 20

## 2022-06-14 MED ORDER — POTASSIUM CHLORIDE CRYS ER 20 MEQ PO TBCR
40.0000 meq | EXTENDED_RELEASE_TABLET | Freq: Once | ORAL | Status: AC
  Administered 2022-06-14: 40 meq via ORAL
  Filled 2022-06-14: qty 2

## 2022-06-14 MED ORDER — SODIUM CHLORIDE 0.9 % IV SOLN: 250.0000 mg | Freq: Every day | INTRAVENOUS | Status: DC

## 2022-06-14 NOTE — Progress Notes (Signed)
[facility]  PROGRESS NOTE  ALEATHIA PURDY GDJ:[id number] DOB: 1941-03-08 DOA: 06/12/2022 PCP: Monico Blitz, MD   Brief HPI:   81 year old female with medical history of hypertension, recent acute AGN associated with hydralazine, recent left leg DVT in June 2023 on Eliquis, history of breast cancer, obesity, GERD recently mated for encephalopathy due to UTI.  She came to ED with complaints of rectal bleeding per rectum.  She was initially seen at [facility] and they wanted to transfer to [facility] but patient left AMA and came to [facility] long ED.  In the ED hemoglobin was 9.2, FOBT was positive. GI consultation was obtained. Patient does have history of diverticulosis in the past    Subjective   Patient seen and examined, denies any complaints.  Hemoglobin dropped to 7.5 this morning.  No further episodes of rectal bleeding.   Assessment/Plan:    Rectal bleeding -Resolved -Colonoscopy shows findings of acute diverticulitis; started on ceftriaxone and Flagyl -Eliquis is currently on hold -Bleeding has stopped -GI following  Acute diverticulitis -Seen on colonoscopy as above -Started on ciprofloxacin and Flagyl   Acute blood loss anemia -Secondary to above -Patient's hemoglobin was 13.1, 10 days ago -Presented with hemoglobin of 9.2; dropped to 7.5 this morning -We will start IV Ferrlecit 250 milligram daily for 2 days -Follow CBC in a.m.   Recent left lower extremity DVT in June 2023 -Eliquis on hold due to above -We will discuss with GI regarding restarting Eliquis.   Hypertension -Antihypertensive medications on hold in the setting of acute GI bleed   Recent diagnosis of acute glomerulonephritis -Felt to be due to hydralazine which has been discontinued -Patient has been on steroids, prednisone 60 mg daily -Continue IV methylprednisolone 50 mg daily -Discussed with nephrology, Dr. Jonnie Finner has reviewed the notes from nephrology, patient has an appointment to  see Dr. Marval Regal on August 28.  He recommended to continue with prednisone 60 mg daily.    AKI on CKD stage IIIb -Likely prerenal in the setting of acute GI bleed.  BUN 50, creatinine 2.0 (baseline around 1.5).   -Creatinine is down to 1.35 -Patient also has history of hydralazine induced glomerulonephritis -IV fluid hydration -Monitor renal function and avoid nephrotoxic agents/hold home Lasix         Medications     brimonidine  1 drop Both Eyes BID   And   timolol  1 drop Both Eyes BID   methylPREDNISolone (SOLU-MEDROL) injection  50 mg Intravenous Q24H   Netarsudil-Latanoprost  1 drop Ophthalmic QHS     Data Reviewed:   CBG:  No results for input(s): "GLUCAP" in the last 168 hours.  SpO2: 99 % O2 Flow Rate (L/min): 6 L/min    Vitals:   06/13/22 1030 06/13/22 1444 06/13/22 2110 06/14/22 0516  BP: (!) 149/57 (!) 147/69 139/67 (!) 158/65  Pulse: (!) 59 62 64 (!) 57  Resp: '14 12 15 15  '$ Temp: 98 F (36.7 C) 97.9 F (36.6 C) 97.7 F (36.5 C) 98.2 F (36.8 C)  TempSrc: Oral Oral Oral Oral  SpO2: 100% 99% 98% 99%  Weight:      Height:          Data Reviewed:  Basic Metabolic Panel: Recent Labs  Lab 06/12/22 0142 06/12/22 0440 06/13/22 1321 06/14/22 0511  NA 140 142 143 139  K 4.5 3.5 3.5 3.4*  CL 105 107 109 108  CO2 '27 28 27 27  '$ GLUCOSE 153* 114* 115* 130*  BUN  50* 49* 33* 26*  CREATININE 2.04* 1.83* 1.35* 1.20*  CALCIUM 9.6 9.2 9.5 9.3    CBC: Recent Labs  Lab 06/12/22 0142 06/12/22 0440 06/12/22 2234 06/13/22 1321 06/14/22 0511  WBC 11.5* 9.5 11.0* 9.5 8.1  HGB 9.2* 8.3* 10.0* 8.2* 7.5*  HCT 28.2* 25.2* 30.0* 25.8* 22.9*  MCV 93.7 93.7 92.3 97.0 95.4  PLT 309 264 324 241 240    LFT Recent Labs  Lab 06/12/22 0142 06/12/22 0440  AST 24 20  ALT 57* 50*  ALKPHOS 58 52  BILITOT 0.7 0.5  PROT 5.5* 4.7*  ALBUMIN 2.7* 2.4*     Antibiotics: Anti-infectives (From admission, onward)    Start     Dose/Rate Route Frequency  Ordered Stop   06/13/22 2242  ciprofloxacin (CIPRO) IVPB 400 mg        400 mg 200 mL/hr over 60 Minutes Intravenous Every 12 hours 06/13/22 1700     06/13/22 1115  ciprofloxacin (CIPRO) IVPB 400 mg  Status:  Discontinued        400 mg 200 mL/hr over 60 Minutes Intravenous Every 24 hours 06/13/22 1027 06/13/22 1700   06/13/22 1115  metroNIDAZOLE (FLAGYL) IVPB 500 mg        500 mg 100 mL/hr over 60 Minutes Intravenous Every 12 hours 06/13/22 1027          DVT prophylaxis: None, Eliquis on hold  Code Status: Full code  Family Communication: No family at bedside   CONSULTS gastroenterology   Objective    Physical Examination:   General-appears in no acute distress Heart-S1-S2, regular, no murmur auscultated Lungs-clear to auscultation bilaterally, no wheezing or crackles auscultated Abdomen-soft, nontender, no organomegaly Extremities-no edema in the lower extremities Neuro-alert, oriented x3, no focal deficit noted   Status is: Inpatient:            [name]   [facility] If 7PM-7AM, please contact night-coverage at [email or web address], Office  [phone]   06/14/2022, 1:47 PM  LOS: 1 day

## 2022-06-14 NOTE — Progress Notes (Addendum)
     [facility] Progress Note  Chief Complaint: Hematochezia and acute blood loss anemia  Subjective  History:  [name] [name] remains a limited historian, however her daughter is at the bedside.  No nursing reports of recurrent overt bleeding, and her daughter confirms that.  [name] [name] denies abdominal pain chest pain or dyspnea. Tolerating a clear liquid diet without difficulty.  The patient's Past Medical, Family and Social History were reviewed and are on file in the EMR.  Objective:  Med list reviewed  Current Facility-Administered Medications:    brimonidine (ALPHAGAN) 0.2 % ophthalmic solution 1 drop, 1 drop, Both Eyes, BID, 1 drop at 06/14/22 0904 **AND** timolol (TIMOPTIC) 0.5 % ophthalmic solution 1 drop, 1 drop, Both Eyes, BID, Ellington, Abby K, RPH, 1 drop at 06/14/22 0905   ciprofloxacin (CIPRO) IVPB 400 mg, 400 mg, Intravenous, Q12H, Dimple Nanas, RPH, Last Rate: 200 mL/hr at 06/14/22 0901, 400 mg at 06/14/22 0901   dextrose 5 %-0.45 % sodium chloride infusion, , Intravenous, Continuous, Danis, Estill Cotta III, MD, Last Rate: 75 mL/hr at 06/13/22 1758, New Bag at 06/13/22 1758   methylPREDNISolone sodium succinate (SOLU-MEDROL) 125 mg/2 mL injection 50 mg, 50 mg, Intravenous, Q24H, Danis, Estill Cotta III, MD, 50 mg at 06/14/22 0857   metroNIDAZOLE (FLAGYL) IVPB 500 mg, 500 mg, Intravenous, Q12H, Danis, Estill Cotta III, MD, Last Rate: 100 mL/hr at 06/13/22 2345, 500 mg at 06/13/22 2345   Netarsudil-Latanoprost 0.02-0.005 % SOLN 1 drop, 1 drop, Ophthalmic, QHS, Ellington, Abby K, RPH, 1 drop at 06/13/22 2343   potassium chloride SA (KLOR-CON M) CR tablet 40 mEq, 40 mEq, Oral, Once, [name], MD   Vital signs in last 24 hrs: Vitals:   06/13/22 2110 06/14/22 0516  BP: 139/67 (!) 158/65  Pulse: 64 (!) 57  Resp: 15 15  Temp: 97.7 F (36.5 C) 98.2 F (36.8 C)  SpO2: 98% 99%   Wt Readings from Last 3 Encounters:  06/13/22 92.5 kg  06/04/22 93.4 kg  04/05/22 107.6 kg     Physical Exam  Fatigued, alert and conversational, restricted affect as before.  Cardiac: Regular with a soft systolic murmur,  no peripheral edema Pulm: clear to auscultation bilaterally, normal RR and effort noted Abdomen: soft, no tenderness, with active bowel sounds. No guarding or palpable hepatosplenomegaly.  Labs:     Latest Ref Rng & Units 06/14/2022    5:11 AM 06/13/2022    1:21 PM 06/12/2022   10:34 PM  CBC  WBC 4.0 - 10.5 K/uL 8.1  9.5  11.0   Hemoglobin 12.0 - 15.0 g/dL 7.5  8.2  10.0   Hematocrit 36.0 - 46.0 % 22.9  25.8  30.0   Platelets 150 - 400 K/uL 240  241  324     ___________________________________________ Radiologic studies:   ____________________________________________ Other:   _____________________________________________ Assessment & Plan  Assessment: Hematochezia Acute blood loss anemia Lower abdominal pain -resolved Acute diverticulitis based on colonoscopy findings.,  On Cipro and Flagyl   Significant drop in hemoglobin last 24 hours, though without overt rebleeding.  Suspect equilibration. Plan: I recommend at least IV iron if not 1 unit of PRBCs, will leave to the discretion of primary medical service. Regular diet Continued hospital observation.  We will cover Dr. Almyra Free until tomorrow morning, at which point he will return to the consult service. See progress note yesterday for additional recommendations. Continue IV antibiotics, probably change to oral antibiotics tomorrow.  Nelida Meuse III

## 2022-06-15 ENCOUNTER — Encounter (HOSPITAL_COMMUNITY): Payer: Self-pay | Admitting: Gastroenterology

## 2022-06-15 DIAGNOSIS — K922 Gastrointestinal hemorrhage, unspecified: Secondary | ICD-10-CM | POA: Diagnosis not present

## 2022-06-15 DIAGNOSIS — I82402 Acute embolism and thrombosis of unspecified deep veins of left lower extremity: Secondary | ICD-10-CM | POA: Diagnosis not present

## 2022-06-15 DIAGNOSIS — D62 Acute posthemorrhagic anemia: Secondary | ICD-10-CM | POA: Diagnosis not present

## 2022-06-15 DIAGNOSIS — I10 Essential (primary) hypertension: Secondary | ICD-10-CM | POA: Diagnosis not present

## 2022-06-15 LAB — BASIC METABOLIC PANEL
Anion gap: 3 — ABNORMAL LOW (ref 5–15)
BUN: 30 mg/dL — ABNORMAL HIGH (ref 8–23)
CO2: 27 mmol/L (ref 22–32)
Calcium: 9.8 mg/dL (ref 8.9–10.3)
Chloride: 109 mmol/L (ref 98–111)
Creatinine, Ser: 1.31 mg/dL — ABNORMAL HIGH (ref 0.44–1.00)
GFR, Estimated: 41 mL/min — ABNORMAL LOW (ref >60)
Glucose, Bld: 105 mg/dL — ABNORMAL HIGH (ref 70–99)
Potassium: 4.3 mmol/L (ref 3.5–5.1)
Sodium: 139 mmol/L (ref 135–145)

## 2022-06-15 LAB — CBC
HCT: 22.1 % — ABNORMAL LOW (ref 36.0–46.0)
Hemoglobin: 7.4 g/dL — ABNORMAL LOW (ref 12.0–15.0)
MCH: 31.6 pg (ref 26.0–34.0)
MCHC: 33.5 g/dL (ref 30.0–36.0)
MCV: 94.4 fL (ref 80.0–100.0)
Platelets: 237 10*3/uL (ref 150–400)
RBC: 2.34 MIL/uL — ABNORMAL LOW (ref 3.87–5.11)
RDW: 14.6 % (ref 11.5–15.5)
WBC: 9.7 10*3/uL (ref 4.0–10.5)
nRBC: 0.5 % — ABNORMAL HIGH (ref 0.0–0.2)

## 2022-06-15 MED ORDER — FUROSEMIDE 40 MG PO TABS
40.0000 mg | ORAL_TABLET | Freq: Every day | ORAL | Status: DC
  Administered 2022-06-15 – 2022-06-16 (×2): 40 mg via ORAL
  Filled 2022-06-15 (×2): qty 1

## 2022-06-15 MED ORDER — PREDNISONE 20 MG PO TABS
60.0000 mg | ORAL_TABLET | Freq: Every day | ORAL | Status: DC
  Administered 2022-06-16: 60 mg via ORAL
  Filled 2022-06-15: qty 3

## 2022-06-15 MED ORDER — APIXABAN 5 MG PO TABS
5.0000 mg | ORAL_TABLET | Freq: Two times a day (BID) | ORAL | Status: DC
  Administered 2022-06-15 – 2022-06-16 (×3): 5 mg via ORAL
  Filled 2022-06-15 (×3): qty 1

## 2022-06-15 NOTE — TOC Initial Note (Signed)
Transition of Care [facility]) - Initial/Assessment Note    Patient Details  Name: [name] [name] MRN: [id number] Date of Birth: 02/18/1941  Transition of Care [facility]) CM/SW Contact:    Leeroy Cha, RN Phone Number: 06/15/2022, 9:29 AM  Clinical Narrative:                  Transition of Care [facility]) Screening Note   Patient Details  Name: [name] [name] Date of Birth: November 19, 1940   Transition of Care [facility]) CM/SW Contact:    Leeroy Cha, RN Phone Number: 06/15/2022, 9:29 AM    Transition of Care Department [facility]) has reviewed patient and no TOC needs have been identified at this time. We will continue to monitor patient advancement through interdisciplinary progression rounds. If new patient transition needs arise, please place a TOC consult.          Patient Goals and CMS Choice        Expected Discharge Plan and Services                                                Prior Living Arrangements/Services                       Activities of Daily Living Home Assistive Devices/Equipment: Cane (specify quad or straight), Dentures (specify type) ADL Screening (condition at time of admission) Patient's cognitive ability adequate to safely complete daily activities?: Yes Is the patient deaf or have difficulty hearing?: No Does the patient have difficulty seeing, even when wearing glasses/contacts?: No Does the patient have difficulty concentrating, remembering, or making decisions?: No Patient able to express need for assistance with ADLs?: Yes Does the patient have difficulty dressing or bathing?: Yes Independently performs ADLs?: No Does the patient have difficulty walking or climbing stairs?: Yes Weakness of Legs: Both Weakness of Arms/Hands: None  Permission Sought/Granted                  Emotional Assessment              Admission diagnosis:  Rectal bleeding [K62.5] Acute lower GI bleeding [K92.2] Diverticulitis  [K57.92] Patient Active Problem List   Diagnosis Date Noted   Diverticulitis 06/13/2022   Acute lower GI bleeding 06/12/2022   Acute blood loss anemia 06/12/2022   Leukocytosis 58/30/9407   Acute metabolic encephalopathy 68/05/8109   Acute cystitis 06/03/2022   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 04/10/2022   Left leg DVT ([place]) 04/07/2022   Obesity (BMI 30-39.9) 04/06/2022   Acute renal failure superimposed on stage 3b chronic kidney disease ([place]) 04/05/2022   Essential hypertension 04/05/2022   Dyspnea 04/05/2022   Malignant neoplasm of upper outer quadrant of female breast ([place]) 06/05/2020   Surgical wound dehiscence 10/06/2018   Osteoarthritis of right hip 09/15/2018   Personal history of colonic polyps 04/27/2013   Anemia, iron deficiency 04/27/2013   PCP:  Monico Blitz, MD Pharmacy:   [facility], [place] [address] Phone: [phone] Fax: [phone]     Social Determinants of Health (SDOH) Interventions    Readmission Risk Interventions     No data to display

## 2022-06-15 NOTE — Progress Notes (Addendum)
[facility]  PROGRESS NOTE  [name] [name] DGU:[id number] DOB: 1940/11/16 DOA: 06/12/2022 PCP: [name] Blitz, MD   Brief HPI:   81 year old female with medical history of hypertension, recent acute AGN associated with hydralazine, recent left leg DVT in June 2023 on Eliquis, history of breast cancer, obesity, GERD recently mated for encephalopathy due to UTI.  She came to ED with complaints of rectal bleeding per rectum.  She was initially seen at [facility] and they wanted to transfer to [facility] but patient left AMA and came to [facility] long ED.  In the ED hemoglobin was 9.2, FOBT was positive. GI consultation was obtained. Patient does have history of diverticulosis in the past    Subjective   Patient seen and examined, no further episodes of rectal bleeding.  Patient takes Lasix 40 mg daily at home.  Discussed with nephrology yesterday, and nephrology recommends to continue taking prednisone 60 mg daily.   Assessment/Plan:    Rectal bleeding -Resolved -Colonoscopy shows findings of acute diverticulitis; started on ceftriaxone and Flagyl -Eliquis is currently on hold -Bleeding has stopped -GI following -We will restart Eliquis today and assess for rectal bleeding  Acute diverticulitis -Seen on colonoscopy as above -Started on ciprofloxacin and Flagyl   Acute blood loss anemia -Secondary to above -Patient's hemoglobin was 13.1, 10 days ago -Presented with hemoglobin of 9.2; dropped to 7.5 this morning -We will start IV Ferrlecit 250 milligram daily for 2 days -Follow CBC in a.m.   Recent left lower extremity DVT in June 2023 -Eliquis will be restarted today    Hypertension -Antihypertensive medications on hold in the setting of acute GI bleed   Recent diagnosis of acute glomerulonephritis -Felt to be due to hydralazine which has been discontinued -Patient has been on steroids, prednisone 60 mg daily -Continue IV methylprednisolone 50 mg daily -Discussed  with nephrology, [name] [name] has reviewed the notes from nephrology, patient has an appointment to see [name] [name] on August 28.  He recommended to continue with prednisone 60 mg daily.    AKI on CKD stage IIIb -Likely prerenal in the setting of acute GI bleed.  BUN 50, creatinine 2.0 (baseline around 1.5).   -Creatinine is down to 1.31 -Patient also has history of hydralazine induced glomerulonephritis -IV fluids were discontinued yesterday -We will restart Lasix 40 mg daily         Medications     apixaban  5 mg Oral BID   brimonidine  1 drop Both Eyes BID   And   timolol  1 drop Both Eyes BID   furosemide  40 mg Oral Daily   methylPREDNISolone (SOLU-MEDROL) injection  50 mg Intravenous Q24H   Netarsudil-Latanoprost  1 drop Ophthalmic QHS     Data Reviewed:   CBG:  No results for input(s): "GLUCAP" in the last 168 hours.  SpO2: 100 % O2 Flow Rate (L/min): 6 L/min    Vitals:   06/14/22 1435 06/14/22 2022 06/15/22 0643 06/15/22 1310  BP: (!) 150/77 (!) 165/73 (!) 154/69 (!) 141/71  Pulse: 71 73 (!) 58 62  Resp: '14 20 20 16  '$ Temp: 98.7 F (37.1 C) 98 F (36.7 C) 98.2 F (36.8 C) 98.3 F (36.8 C)  TempSrc: Oral Oral Oral Oral  SpO2: 98% 99% 100% 100%  Weight:      Height:          Data Reviewed:  Basic Metabolic Panel: Recent Labs  Lab 06/12/22 0142 06/12/22 0440 06/13/22 1321 06/14/22 0511 06/15/22  0509  NA 140 142 143 139 139  K 4.5 3.5 3.5 3.4* 4.3  CL 105 107 109 108 109  CO2 '27 28 27 27 27  '$ GLUCOSE 153* 114* 115* 130* 105*  BUN 50* 49* 33* 26* 30*  CREATININE 2.04* 1.83* 1.35* 1.20* 1.31*  CALCIUM 9.6 9.2 9.5 9.3 9.8    CBC: Recent Labs  Lab 06/12/22 0440 06/12/22 2234 06/13/22 1321 06/14/22 0511 06/15/22 0509  WBC 9.5 11.0* 9.5 8.1 9.7  HGB 8.3* 10.0* 8.2* 7.5* 7.4*  HCT 25.2* 30.0* 25.8* 22.9* 22.1*  MCV 93.7 92.3 97.0 95.4 94.4  PLT 264 324 241 240 237    LFT Recent Labs  Lab 06/12/22 0142 06/12/22 0440  AST 24 20   ALT 57* 50*  ALKPHOS 58 52  BILITOT 0.7 0.5  PROT 5.5* 4.7*  ALBUMIN 2.7* 2.4*     Antibiotics: Anti-infectives (From admission, onward)    Start     Dose/Rate Route Frequency Ordered Stop   06/13/22 2242  ciprofloxacin (CIPRO) IVPB 400 mg        400 mg 200 mL/hr over 60 Minutes Intravenous Every 12 hours 06/13/22 1700     06/13/22 1115  ciprofloxacin (CIPRO) IVPB 400 mg  Status:  Discontinued        400 mg 200 mL/hr over 60 Minutes Intravenous Every 24 hours 06/13/22 1027 06/13/22 1700   06/13/22 1115  metroNIDAZOLE (FLAGYL) IVPB 500 mg        500 mg 100 mL/hr over 60 Minutes Intravenous Every 12 hours 06/13/22 1027          DVT prophylaxis: None, Eliquis on hold  Code Status: Full code  Family Communication: Discussed with patient's daughter at bedside   CONSULTS gastroenterology   Objective    Physical Examination:   General-appears in no acute distress Heart-S1-S2, regular, no murmur auscultated Lungs-clear to auscultation bilaterally, no wheezing or crackles auscultated Abdomen-soft, nontender, no organomegaly Extremities-no edema in the lower extremities Neuro-alert, oriented x3, no focal deficit noted   Status is: Inpatient:            [name] [name]   [facility] If 7PM-7AM, please contact night-coverage at [email or web address], Office  [phone]   06/15/2022, 2:53 PM  LOS: 2 days

## 2022-06-15 NOTE — Evaluation (Signed)
Physical Therapy Evaluation Patient Details Name: [name] [name] MRN: [id number] DOB: 19-Aug-1941 Today's Date: 06/15/2022  History of Present Illness  81 year old female with medical history of hypertension, recent acute AGN associated with hydralazine, recent left leg DVT in June 2023 on Eliquis, history of breast cancer, obesity, GERD, recently admitted for encephalopathy due to UTI. Pt admitted 06/12/22 for rectal bleeding.  Clinical Impression  Pt admitted with above diagnosis.  Pt currently with functional limitations due to the deficits listed below (see PT Problem List). Pt will benefit from skilled PT to increase their independence and safety with mobility to allow discharge to the venue listed below.   Pt assisted with ambulating in hallway short distance.  Pt typically uses SPC however utilized RW today, which she also has at home.  Pt had one HHPT visit prior to readmission and BSC over toilet was recommended as pt was having a little difficulty with sit to stand from toilet.  Daughter reports she is typically present to assist pt as needed however also has family members switching out so pt has mostly 24/7 care.  Daughter requesting [place] aide if possible.        Recommendations for follow up therapy are one component of a multi-disciplinary discharge planning process, led by the attending physician.  Recommendations may be updated based on patient status, additional functional criteria and insurance authorization.  Follow Up Recommendations Home health PT      Assistance Recommended at Discharge PRN  Patient can return home with the following  A little help with bathing/dressing/bathroom;Assistance with cooking/housework;Help with stairs or ramp for entrance;Assist for transportation    Equipment Recommendations None recommended by PT  Recommendations for Other Services       Functional Status Assessment Patient has had a recent decline in their functional status and  demonstrates the ability to make significant improvements in function in a reasonable and predictable amount of time.     Precautions / Restrictions Precautions Precautions: Fall      Mobility  Bed Mobility               General bed mobility comments: up in recliner    Transfers Overall transfer level: Needs assistance Equipment used: Rolling walker (2 wheels) Transfers: Sit to/from Stand Sit to Stand: Min assist           General transfer comment: light assist to rise, increased time and effort    Ambulation/Gait Ambulation/Gait assistance: Min guard Gait Distance (Feet): 80 Feet Assistive device: Rolling walker (2 wheels) Gait Pattern/deviations: Step-through pattern, Decreased stride length Gait velocity: decr     General Gait Details: cues for posture and RW positioning, no unsteadiness or LOB with RW, pt did fatigue quickly  Stairs            Wheelchair Mobility    Modified Rankin (Stroke Patients Only)       Balance                                             Pertinent Vitals/Pain Pain Assessment Pain Assessment: No/denies pain    Home Living Family/patient expects to be discharged to:: Private residence Living Arrangements: Children Available Help at Discharge: Family;Available PRN/intermittently Type of Home: House Home Access: Ramped entrance       Home Layout: One level Home Equipment: Conservation officer, nature (2 wheels);BSC/3in1;Cane - single point Additional Comments: lives  with daughter who works during the day, family plans to bring in help to be with pt during the day    Prior Function Prior Level of Function : Independent/Modified Independent             Mobility Comments: walks with SPC, denies falls in past 6 months ADLs Comments: Independent     Hand Dominance        Extremity/Trunk Assessment        Lower Extremity Assessment Lower Extremity Assessment: Overall WFL for tasks assessed     Cervical / Trunk Assessment Cervical / Trunk Assessment: Normal  Communication   Communication: No difficulties  Cognition Arousal/Alertness: Awake/alert Behavior During Therapy: WFL for tasks assessed/performed Overall Cognitive Status: Within Functional Limits for tasks assessed                                          General Comments General comments (skin integrity, edema, etc.): pt and daughter deny any falls    Exercises     Assessment/Plan    PT Assessment Patient needs continued PT services  PT Problem List Decreased activity tolerance;Decreased mobility;Decreased balance       PT Treatment Interventions Therapeutic exercise;Gait training;Patient/family education;Therapeutic activities;Functional mobility training;Balance training    PT Goals (Current goals can be found in the Care Plan section)  Acute Rehab PT Goals PT Goal Formulation: With patient Time For Goal Achievement: 06/29/22 Potential to Achieve Goals: Good    Frequency Min 3X/week     Co-evaluation               AM-PAC PT "6 Clicks" Mobility  Outcome Measure Help needed turning from your back to your side while in a flat bed without using bedrails?: None Help needed moving from lying on your back to sitting on the side of a flat bed without using bedrails?: A Little Help needed moving to and from a bed to a chair (including a wheelchair)?: A Little Help needed standing up from a chair using your arms (e.g., wheelchair or bedside chair)?: A Little Help needed to walk in hospital room?: A Little Help needed climbing 3-5 steps with a railing? : A Little 6 Click Score: 19    End of Session Equipment Utilized During Treatment: Gait belt Activity Tolerance: Patient tolerated treatment well Patient left: in chair;with call bell/phone within reach;with family/visitor present   PT Visit Diagnosis: Difficulty in walking, not elsewhere classified (R26.2)    Time:  8921-1941 PT Time Calculation (min) (ACUTE ONLY): 13 min   Charges:   PT Evaluation $PT Eval Low Complexity: 1 Low        Kati PT, DPT Physical Therapist Acute Rehabilitation Services Preferred contact method: Secure Chat Weekend Pager Only: [phone] Office: [place] 06/15/2022, 3:14 PM

## 2022-06-15 NOTE — Anesthesia Postprocedure Evaluation (Signed)
Anesthesia Post Note  Patient: FALEN LEHRMANN  Procedure(s) Performed: COLONOSCOPY WITH PROPOFOL     Patient location during evaluation: Endoscopy Anesthesia Type: MAC Level of consciousness: awake and alert Pain management: pain level controlled Vital Signs Assessment: post-procedure vital signs reviewed and stable Respiratory status: spontaneous breathing, nonlabored ventilation, respiratory function stable and patient connected to nasal cannula oxygen Cardiovascular status: stable and blood pressure returned to baseline Postop Assessment: no apparent nausea or vomiting Anesthetic complications: no   No notable events documented.  Last Vitals:  Vitals:   06/14/22 2022 06/15/22 0643  BP: (!) 165/73 (!) 154/69  Pulse: 73 (!) 58  Resp: 20 20  Temp: 36.7 C 36.8 C  SpO2: 99% 100%    Last Pain:  Vitals:   06/15/22 0643  TempSrc: Oral  PainSc:                  March Rummage Lynora Dymond

## 2022-06-15 NOTE — Progress Notes (Signed)
UNASSIGNED PATIENT Subjective: [name] [name] is a 81 year old black female with multiple medical problems including history of GERD, colonic diverticulosis and polyps, hypertension, obesity and left leg DVT in 04/14/2022 on Eliquis, prior to admission and a previous history of breast cancer admitted with hematochezia she was noted to have acute diverticulitis and was treated with antibiotics.  Her Eliquis has been on hold her hemoglobin is at 7.5 g/dL today but there is no evidence of any ongoing bleeding on admission hemoglobin was down to 9.2 g/dL dropped from 13.1 g 10 days prior to admission. She received an iron infusion during this hospitalization. She denies any abdominal pain, nausea, vomiting,  melena or hematochezia..  Objective: Vital signs in last 24 hours: Temp:  [98 F (36.7 C)-98.7 F (37.1 C)] 98.2 F (36.8 C) (08/21 0643) Pulse Rate:  [58-73] 58 (08/21 0643) Resp:  [14-20] 20 (08/21 0643) BP: (150-165)/(69-77) 154/69 (08/21 0643) SpO2:  [98 %-100 %] 100 % (08/21 0643) Last BM Date : 06/13/22  Intake/Output from previous day: 08/20 0701 - 08/21 0700 In: 1300.2 [P.O.:600; IV Piggyback:700.2] Out: 700 [Urine:700] Intake/Output this shift: Total I/O In: 120 [P.O.:120] Out: -   General appearance: cooperative, appears stated age, fatigued, and no distress Resp: clear to auscultation bilaterally Cardio: regular rate and rhythm, S1, S2 normal, no murmur, click, rub or gallop GI: soft, non-tender; bowel sounds normal; no masses,  no organomegaly  Lab Results: Recent Labs    06/13/22 1321 06/14/22 0511 06/15/22 0509  WBC 9.5 8.1 9.7  HGB 8.2* 7.5* 7.4*  HCT 25.8* 22.9* 22.1*  PLT 241 240 237   BMET Recent Labs    06/13/22 1321 06/14/22 0511 06/15/22 0509  NA 143 139 139  K 3.5 3.4* 4.3  CL 109 108 109  CO2 '27 27 27  '$ GLUCOSE 115* 130* 105*  BUN 33* 26* 30*  CREATININE 1.35* 1.20* 1.31*  CALCIUM 9.5 9.3 9.8   LFT No results for input(s): "PROT",  "ALBUMIN", "AST", "ALT", "ALKPHOS", "BILITOT", "BILIDIR", "IBILI" in the last 72 hours. PT/INR No results for input(s): "LABPROT", "INR" in the last 72 hours. Hepatitis Panel No results for input(s): "HEPBSAG", "HCVAB", "HEPAIGM", "HEPBIGM" in the last 72 hours. C-Diff No results for input(s): "CDIFFTOX" in the last 72 hours. No results for input(s): "CDIFFPCR" in the last 72 hours. Fecal Lactopherrin No results for input(s): "FECLLACTOFRN" in the last 72 hours.  Studies/Results: No results found.  Medications: I have reviewed the patient's current medications. Prior to Admission:  Medications Prior to Admission  Medication Sig Dispense Refill Last Dose   amLODipine (NORVASC) 10 MG tablet Take 10 mg by mouth daily.   06/11/2022   anastrozole (ARIMIDEX) 1 MG tablet Take 1 mg by mouth daily.   06/11/2022   apixaban (ELIQUIS) 5 MG TABS tablet Take 2 tablets (10 mg total) by mouth 2 (two) times daily. X 7 days, then one tab (5 mg) bid starting 04/20/22 (Patient taking differently: Take 5 mg by mouth 2 (two) times daily.) 74 tablet 0 06/11/2022 at 0730   Cholecalciferol 25 MCG (1000 UT) tablet Take 1,000 Units by mouth every other day.   06/11/2022   COMBIGAN 0.2-0.5 % ophthalmic solution Place 1 drop into both eyes every 12 (twelve) hours.   06/11/2022   furosemide (LASIX) 40 MG tablet Take 1 tablet (40 mg total) by mouth daily. 30 tablet 0 06/11/2022   metoprolol tartrate (LOPRESSOR) 25 MG tablet Take 1 tablet (25 mg total) by mouth 2 (two) times daily. Paia  tablet 0 06/11/2022 at 0730   pantoprazole (PROTONIX) 40 MG tablet Take 40 mg by mouth daily.   06/11/2022   predniSONE (DELTASONE) 20 MG tablet Take 3 tablets (60 mg total) by mouth daily. 90 tablet 0 06/11/2022   ROCKLATAN 0.02-0.005 % SOLN Apply 1 drop to eye at bedtime.   06/10/2022   potassium chloride SA (KLOR-CON M) 20 MEQ tablet Take 2 tablets (40 mEq total) by mouth daily for 2 doses. (Patient not taking: Reported on 06/12/2022) 4 tablet 0  Completed Course   Scheduled:  apixaban  5 mg Oral BID   brimonidine  1 drop Both Eyes BID   And   timolol  1 drop Both Eyes BID   furosemide  40 mg Oral Daily   methylPREDNISolone (SOLU-MEDROL) injection  50 mg Intravenous Q24H   Netarsudil-Latanoprost  1 drop Ophthalmic QHS   Continuous:  ciprofloxacin 400 mg (06/15/22 1024)   ferric gluconate (FERRLECIT) IVPB 250 mg (06/15/22 1323)   metronidazole 500 mg (06/15/22 1140)    Assessment/Plan: 1) Rectal bleeding with diverticulosis on colonoscopy treated with antibiotics for presumed diverticulitis currently stable from a GI standpoint.  As per my discussion with Dr. Darrick Meigs over the phone plans are to start on Eliquis today and monitor closely for any signs of rebleeding.  She has had left tibial DVT in June this year and will require anticoagulation for at least 6 months. 2) Acute blood loss anemia secondary to GI bleed. 3) HTN. 4) AKI on CKD stage IIIb.  Patient also has a history of Hydralazine included glomerulonephritis  LOS: 2 days   Juanita Craver 06/15/2022, 11:46 AM

## 2022-06-16 ENCOUNTER — Telehealth: Payer: Self-pay | Admitting: *Deleted

## 2022-06-16 DIAGNOSIS — K625 Hemorrhage of anus and rectum: Secondary | ICD-10-CM

## 2022-06-16 DIAGNOSIS — K5792 Diverticulitis of intestine, part unspecified, without perforation or abscess without bleeding: Secondary | ICD-10-CM

## 2022-06-16 DIAGNOSIS — I10 Essential (primary) hypertension: Secondary | ICD-10-CM | POA: Diagnosis not present

## 2022-06-16 DIAGNOSIS — K922 Gastrointestinal hemorrhage, unspecified: Secondary | ICD-10-CM | POA: Diagnosis not present

## 2022-06-16 LAB — CBC
HCT: 24.1 % — ABNORMAL LOW (ref 36.0–46.0)
Hemoglobin: 7.8 g/dL — ABNORMAL LOW (ref 12.0–15.0)
MCH: 31.2 pg (ref 26.0–34.0)
MCHC: 32.4 g/dL (ref 30.0–36.0)
MCV: 96.4 fL (ref 80.0–100.0)
Platelets: 227 10*3/uL (ref 150–400)
RBC: 2.5 MIL/uL — ABNORMAL LOW (ref 3.87–5.11)
RDW: 14.9 % (ref 11.5–15.5)
WBC: 11.8 10*3/uL — ABNORMAL HIGH (ref 4.0–10.5)
nRBC: 0.5 % — ABNORMAL HIGH (ref 0.0–0.2)

## 2022-06-16 MED ORDER — APIXABAN 5 MG PO TABS: 5.0000 mg | ORAL_TABLET | Freq: Two times a day (BID) | ORAL | 2 refills | Status: AC

## 2022-06-16 NOTE — Discharge Summary (Signed)
Physician Discharge Summary   Patient: [name] [name] MRN: [id number] DOB: [birth year]  Admit date:     06/12/2022  Discharge date: 06/16/22  Discharge Physician: [name]   PCP: Monico Blitz, MD   Recommendations at discharge:   Follow-up nephrology on 06/22/2022 Follow-up PCP in 2 weeks  Discharge Diagnoses: Principal Problem:   Acute lower GI bleeding Active Problems: Acute diverticulitis   Left leg DVT (HCC)   Acute renal failure superimposed on stage 3b chronic kidney disease (HCC)   Essential hypertension   Acute blood loss anemia   Leukocytosis   Resolved Problems:   * No resolved hospital problems. [facility] Course: 81 year old female with medical history of hypertension, recent acute AGN associated with hydralazine, recent left leg DVT in June 2023 on Eliquis, history of breast cancer, obesity, GERD recently mated for encephalopathy due to UTI.  She came to ED with complaints of rectal bleeding per rectum.  She was initially seen at [facility] and they wanted to transfer to [facility] but patient left AMA and came to [facility] long ED.  In the ED hemoglobin was 9.2, FOBT was positive. GI consultation was obtained. Patient does have history of diverticulosis in the past    Assessment and Plan:  Rectal bleeding -Resolved -Colonoscopy shows findings of acute diverticulitis; started on ceftriaxone and Flagyl -Bleeding has stopped, Eliquis restarted yesterday -No further episodes of bleeding -Discussed with GI Dr. Collene Mares, she says patient can be discharged home today.  Acute diverticulitis -Seen on colonoscopy as above -Started on ciprofloxacin and Flagyl -Discussed with GI, patient has received 4 days of therapy in the hospital. -No antibiotics needed   Acute blood loss anemia -Secondary to above -Patient's hemoglobin was 13.1, 10 days ago -Presented with hemoglobin of 9.2; dropped to 7.5 t -Patient was given  IV Ferrlecit 250 milligram daily for 2  days -This morning patient's hemoglobin went up to 7.8   Recent left lower extremity DVT in June 2023 -Eliquis has been restarted     Hypertension -Continue home medications   Recent diagnosis of acute glomerulonephritis -Felt to be due to hydralazine which has been discontinued -Patient has been on steroids, prednisone 60 mg daily -Continue IV methylprednisolone 50 mg daily -Discussed with nephrology, Dr. Jonnie Finner has reviewed the notes from nephrology, patient has an appointment to see Dr. Marval Regal on August 28.  He recommended to continue with prednisone 60 mg daily.    AKI on CKD stage IIIb -Likely prerenal in the setting of acute GI bleed.  BUN 50, creatinine 2.0 (baseline around 1.5).   -Creatinine is down to 1.31 -Patient also has history of hydralazine induced glomerulonephritis -Lasix 40 mg p.o. daily has been restarted        Consultants: Nephrology Procedures performed:  Disposition: Home Diet recommendation:  Regular diet DISCHARGE MEDICATION: Allergies as of 06/16/2022       Reactions   Ace Inhibitors Swelling   Hydralazine Other (See Comments)   Causes acute renal failure. Hydralazine induced glomerular nephritis   Sulfa Antibiotics Swelling, Rash        Medication List     STOP taking these medications    potassium chloride SA 20 MEQ tablet Commonly known as: KLOR-CON M       TAKE these medications    amLODipine 10 MG tablet Commonly known as: NORVASC Take 10 mg by mouth daily.   anastrozole 1 MG tablet Commonly known as: ARIMIDEX Take 1 mg by mouth daily.   apixaban 5  MG Tabs tablet Commonly known as: ELIQUIS Take 1 tablet (5 mg total) by mouth 2 (two) times daily.   Cholecalciferol 25 MCG (1000 UT) tablet Take 1,000 Units by mouth every other day.   Combigan 0.2-0.5 % ophthalmic solution Generic drug: brimonidine-timolol Place 1 drop into both eyes every 12 (twelve) hours.   furosemide 40 MG tablet Commonly known as:  LASIX Take 1 tablet (40 mg total) by mouth daily.   metoprolol tartrate 25 MG tablet Commonly known as: LOPRESSOR Take 1 tablet (25 mg total) by mouth 2 (two) times daily.   pantoprazole 40 MG tablet Commonly known as: PROTONIX Take 40 mg by mouth daily.   predniSONE 20 MG tablet Commonly known as: DELTASONE Take 3 tablets (60 mg total) by mouth daily.   Rocklatan 0.02-0.005 % Soln Generic drug: Netarsudil-Latanoprost Apply 1 drop to eye at bedtime.        Follow-up [facility].   Why: [facility] Contact information: [address] [place] [address] [phone]         Monico Blitz, MD Follow up in 2 week(s).   Specialty: Internal Medicine Contact information: [place] [address] [phone]                Discharge Exam: Danley Danker Weights   06/12/22 0014 06/13/22 0905  Weight: 92.5 kg 92.5 kg   General-appears in no acute distress Heart-S1-S2, regular, no murmur auscultated Lungs-clear to auscultation bilaterally, no wheezing or crackles auscultated Abdomen-soft, nontender, no organomegaly Extremities-no edema in the lower extremities Neuro-alert, oriented x3, no focal deficit noted  Condition at discharge: good  The results of significant diagnostics from this hospitalization (including imaging, microbiology, ancillary and laboratory) are listed below for reference.   Imaging Studies: DG Chest 2 View  Result Date: 06/02/2022 CLINICAL DATA:  Mental status changes.  Weakness. EXAM: CHEST - 2 VIEW COMPARISON:  04/05/2022 FINDINGS: Lateral view degraded by patient arm position. Midline trachea. Borderline cardiomegaly. Tortuous thoracic aorta. Atherosclerosis in the transverse aorta. No pleural effusion or pneumothorax. Clear lungs. IMPRESSION: No acute cardiopulmonary disease. Aortic Atherosclerosis (ICD10-I70.0). Electronically Signed   By: Abigail Miyamoto M.D.   On: 06/02/2022 19:10   CT Head Wo  Contrast  Result Date: 06/02/2022 CLINICAL DATA:  Weakness and confusion since August 2nd. EXAM: CT HEAD WITHOUT CONTRAST TECHNIQUE: Contiguous axial images were obtained from the base of the skull through the vertex without intravenous contrast. RADIATION DOSE REDUCTION: This exam was performed according to the departmental dose-optimization program which includes automated exposure control, adjustment of the mA and/or kV according to patient size and/or use of iterative reconstruction technique. COMPARISON:  Report of a head CT of 10/16/2010 from [facility]. FINDINGS: Brain: Advanced cerebral and cerebellar atrophy. Moderate low density in the periventricular white matter likely related to small vessel disease. No mass lesion, hemorrhage, hydrocephalus, acute infarct, intra-axial, or extra-axial fluid collection. Vascular: No hyperdense vessel or unexpected calcification. Intracranial atherosclerosis. Skull: Normal Sinuses/Orbits: Normal imaged portions of the orbits and globes. Clear paranasal sinuses and mastoid air cells. Other: None. IMPRESSION: 1. No acute intracranial abnormality. 2. Cerebral/cerebellar atrophy and small vessel ischemic change. Electronically Signed   By: Abigail Miyamoto M.D.   On: 06/02/2022 19:06    Microbiology: Results for orders placed or performed during the hospital encounter of 06/02/22  Urine Culture     Status: Abnormal   Collection Time: 06/02/22  7:17 PM   Specimen: Urine, Clean  Catch  Result Value Ref Range Status   Specimen Description   Final    URINE, CLEAN CATCH Performed at [facility], [place] [address], [place], [place] [address]    Special Requests   Final    NONE Performed at [facility], [place] [address] [place],  [address]    Culture MULTIPLE SPECIES PRESENT, SUGGEST RECOLLECTION (A)  Final   Report Status 06/04/2022 FINAL  Final    Labs: CBC: Recent Labs  Lab 06/12/22 2234 06/13/22 1321  06/14/22 0511 06/15/22 0509 06/16/22 0506  WBC 11.0* 9.5 8.1 9.7 11.8*  HGB 10.0* 8.2* 7.5* 7.4* 7.8*  HCT 30.0* 25.8* 22.9* 22.1* 24.1*  MCV 92.3 97.0 95.4 94.4 96.4  PLT 324 241 240 237 356   Basic Metabolic Panel: Recent Labs  Lab 06/12/22 0142 06/12/22 0440 06/13/22 1321 06/14/22 0511 06/15/22 0509  NA 140 142 143 139 139  K 4.5 3.5 3.5 3.4* 4.3  CL 105 107 109 108 109  CO2 '27 28 27 27 27  '$ GLUCOSE 153* 114* 115* 130* 105*  BUN 50* 49* 33* 26* 30*  CREATININE 2.04* 1.83* 1.35* 1.20* 1.31*  CALCIUM 9.6 9.2 9.5 9.3 9.8   Liver Function Tests: Recent Labs  Lab 06/12/22 0142 06/12/22 0440  AST 24 20  ALT 57* 50*  ALKPHOS 58 52  BILITOT 0.7 0.5  PROT 5.5* 4.7*  ALBUMIN 2.7* 2.4*   CBG: No results for input(s): "GLUCAP" in the last 168 hours.  Discharge time spent: greater than 30 minutes.  Signed: [name], MD [facility] 06/16/2022

## 2022-06-16 NOTE — TOC Transition Note (Signed)
Transition of Care [facility]) - CM/SW Discharge Note   Patient Details  Name: [name] [name] MRN: [id number] Date of Birth: 05-30-1941  [facility] CM/SW Contact:  Dessa Phi, RN Phone Number: 06/16/2022, 10:13 AM   Clinical Narrative:  Spoke to dtr about [place] services-intermittent-voiced understanding/agree. The only [place] agency to accept is [facility] for HHPT only-MD aware since ordered HHPT,aide-aide not provided by [facility]. Patient used them just 1 week ago,but was admitted to hospital prior 2nd home visit. Provided info for private duty care services-[name] voiced understanding.Patient aware. Has own transportation. Declined HHC services:[facility]. No further CM needs.   Final next level of care: [place] Barriers to Discharge: No Barriers Identified   Patient Goals and CMS Choice     Choice offered to / list presented to : Adult Children  Discharge Placement                       Discharge Plan and Services   Discharge Planning Services: CM Consult Post Acute Care Choice: Home Health                    HH Arranged: PT HH Agency: [facility] Date [facility] Contacted: 06/16/22 Time HH Agency Contacted: [phone] Representative spoke with at [place]:  Malachy Mood)  Social Determinants of Health ([place]) Interventions     Readmission Risk Interventions     No data to display

## 2022-06-16 NOTE — Progress Notes (Signed)
AVS given and reviewed with pt and daughter, Suanne Marker. Medications discussed. All questions answered to satisfaction. Pt and daughter verbalized understanding of information given. Pt escorted off the unit with all belongings via wheelchair by staff member.

## 2022-06-16 NOTE — Plan of Care (Signed)
  Problem: Pain Managment: Goal: General experience of comfort will improve Outcome: Progressing   Problem: Safety: Goal: Ability to remain free from injury will improve Outcome: Progressing   Problem: Skin Integrity: Goal: Risk for impaired skin integrity will decrease Outcome: Progressing   

## 2022-06-16 NOTE — Chronic Care Management (AMB) (Unsigned)
  Care Coordination  Outreach Note  06/16/2022 Name: [name] [name] MRN: [id number] DOB: 01-03-1941   Care Coordination Outreach Attempts  An unsuccessful telephone outreach was attempted today to offer the patient information about available care coordination services as a benefit of their health plan.   Reason: Outreach to schedule referral with RNCM   Follow Up Plan:  Additional outreach attempts will be made to offer the patient care coordination information and services.   Encounter Outcome:  No Answer  [employer]  Direct Dial: [phone]

## 2022-06-16 NOTE — Consult Note (Addendum)
   [facility] CM Inpatient Consult   06/16/2022  [name] [name] [birth year] [id number]  . [place] Organization [ACO] Patient:  [facility] Hospital Liaison coverage for [facility] - remote review and call attempt  Primary Care Provider: Monico Blitz, MD, is with [facility] Internal Medicine, is an [place] provider with a Care Management team and program and is listed for the [facility] follow up needs     Patient screened readmission less than 30 days hospitalization with noted high risk score for unplanned readmission risk.  Reviewed to assess for potential [place] Management service needs for post hospital transition for readmission prevention needs.  Review of patient's medical record reveals patient is transitioning home with [facility] but no HH aide available noted.  Call placed but unable to reach at bedside.     Plan:  Referral request for Embedded outreach due to unable to get [facility] aide for post hospital for follow up needs for community   For questions contact:    Natividad Brood, RN BSN [facility] Liaison  [phone] business mobile phone Toll free office [phone]  Fax number: [phone] [email or web address]

## 2022-06-17 DIAGNOSIS — C50419 Malignant neoplasm of upper-outer quadrant of unspecified female breast: Secondary | ICD-10-CM | POA: Diagnosis not present

## 2022-06-17 DIAGNOSIS — Z9181 History of falling: Secondary | ICD-10-CM | POA: Diagnosis not present

## 2022-06-17 DIAGNOSIS — K5792 Diverticulitis of intestine, part unspecified, without perforation or abscess without bleeding: Secondary | ICD-10-CM | POA: Diagnosis not present

## 2022-06-17 DIAGNOSIS — N1832 Chronic kidney disease, stage 3b: Secondary | ICD-10-CM | POA: Diagnosis not present

## 2022-06-17 DIAGNOSIS — D62 Acute posthemorrhagic anemia: Secondary | ICD-10-CM | POA: Diagnosis not present

## 2022-06-17 DIAGNOSIS — I82402 Acute embolism and thrombosis of unspecified deep veins of left lower extremity: Secondary | ICD-10-CM | POA: Diagnosis not present

## 2022-06-17 DIAGNOSIS — G9341 Metabolic encephalopathy: Secondary | ICD-10-CM | POA: Diagnosis not present

## 2022-06-17 DIAGNOSIS — Z7901 Long term (current) use of anticoagulants: Secondary | ICD-10-CM | POA: Diagnosis not present

## 2022-06-17 DIAGNOSIS — N179 Acute kidney failure, unspecified: Secondary | ICD-10-CM | POA: Diagnosis not present

## 2022-06-17 DIAGNOSIS — I700 Atherosclerosis of aorta: Secondary | ICD-10-CM | POA: Diagnosis not present

## 2022-06-17 DIAGNOSIS — I129 Hypertensive chronic kidney disease with stage 1 through stage 4 chronic kidney disease, or unspecified chronic kidney disease: Secondary | ICD-10-CM | POA: Diagnosis not present

## 2022-06-17 NOTE — Chronic Care Management (AMB) (Unsigned)
  Care Coordination  Outreach Note  06/17/2022 Name: [name] [name] MRN: [id number] DOB: [birth year]   Care Coordination Outreach Attempts  A second unsuccessful outreach was attempted today to offer the patient with information about available care coordination services as a benefit of their health plan.     Follow Up Plan:  Additional outreach attempts will be made to offer the patient care coordination information and services.   Encounter Outcome:  No Answer  [place]  Direct Dial: [phone]

## 2022-06-18 NOTE — Chronic Care Management (AMB) (Signed)
  Care Coordination  Outreach Note  06/18/2022 Name: [name] [name] MRN: [id number] DOB: 03/26/1941   Care Coordination Outreach Attempts  A third unsuccessful outreach was attempted today to offer the patient with information about available care coordination services as a benefit of their health plan.   Follow Up Plan:  No further outreach attempts will be made at this time. We have been unable to contact the patient to offer or enroll patient in care coordination services  Encounter Outcome:  No Answer  [place]: [phone]

## 2022-06-19 DIAGNOSIS — N1832 Chronic kidney disease, stage 3b: Secondary | ICD-10-CM | POA: Diagnosis not present

## 2022-06-19 DIAGNOSIS — K5792 Diverticulitis of intestine, part unspecified, without perforation or abscess without bleeding: Secondary | ICD-10-CM | POA: Diagnosis not present

## 2022-06-19 DIAGNOSIS — I700 Atherosclerosis of aorta: Secondary | ICD-10-CM | POA: Diagnosis not present

## 2022-06-19 DIAGNOSIS — I82402 Acute embolism and thrombosis of unspecified deep veins of left lower extremity: Secondary | ICD-10-CM | POA: Diagnosis not present

## 2022-06-19 DIAGNOSIS — I129 Hypertensive chronic kidney disease with stage 1 through stage 4 chronic kidney disease, or unspecified chronic kidney disease: Secondary | ICD-10-CM | POA: Diagnosis not present

## 2022-06-19 DIAGNOSIS — D62 Acute posthemorrhagic anemia: Secondary | ICD-10-CM | POA: Diagnosis not present

## 2022-06-19 DIAGNOSIS — N179 Acute kidney failure, unspecified: Secondary | ICD-10-CM | POA: Diagnosis not present

## 2022-06-19 DIAGNOSIS — G9341 Metabolic encephalopathy: Secondary | ICD-10-CM | POA: Diagnosis not present

## 2022-06-19 DIAGNOSIS — Z9181 History of falling: Secondary | ICD-10-CM | POA: Diagnosis not present

## 2022-06-19 DIAGNOSIS — C50419 Malignant neoplasm of upper-outer quadrant of unspecified female breast: Secondary | ICD-10-CM | POA: Diagnosis not present

## 2022-06-19 DIAGNOSIS — Z7901 Long term (current) use of anticoagulants: Secondary | ICD-10-CM | POA: Diagnosis not present

## 2022-06-22 DIAGNOSIS — N189 Chronic kidney disease, unspecified: Secondary | ICD-10-CM | POA: Diagnosis not present

## 2022-06-22 DIAGNOSIS — N058 Unspecified nephritic syndrome with other morphologic changes: Secondary | ICD-10-CM | POA: Diagnosis not present

## 2022-06-22 DIAGNOSIS — Z79899 Other long term (current) drug therapy: Secondary | ICD-10-CM | POA: Diagnosis not present

## 2022-06-22 DIAGNOSIS — D631 Anemia in chronic kidney disease: Secondary | ICD-10-CM | POA: Diagnosis not present

## 2022-06-22 DIAGNOSIS — N057 Unspecified nephritic syndrome with diffuse crescentic glomerulonephritis: Secondary | ICD-10-CM | POA: Diagnosis not present

## 2022-06-22 DIAGNOSIS — N390 Urinary tract infection, site not specified: Secondary | ICD-10-CM | POA: Diagnosis not present

## 2022-06-22 DIAGNOSIS — N179 Acute kidney failure, unspecified: Secondary | ICD-10-CM | POA: Diagnosis not present

## 2022-06-22 DIAGNOSIS — D849 Immunodeficiency, unspecified: Secondary | ICD-10-CM | POA: Diagnosis not present

## 2022-06-22 DIAGNOSIS — I129 Hypertensive chronic kidney disease with stage 1 through stage 4 chronic kidney disease, or unspecified chronic kidney disease: Secondary | ICD-10-CM | POA: Diagnosis not present

## 2022-06-25 DIAGNOSIS — I10 Essential (primary) hypertension: Secondary | ICD-10-CM | POA: Diagnosis not present

## 2022-06-25 DIAGNOSIS — D509 Iron deficiency anemia, unspecified: Secondary | ICD-10-CM | POA: Diagnosis not present

## 2022-06-25 DIAGNOSIS — R531 Weakness: Secondary | ICD-10-CM | POA: Diagnosis not present

## 2022-06-25 DIAGNOSIS — R2681 Unsteadiness on feet: Secondary | ICD-10-CM | POA: Diagnosis not present

## 2022-06-25 DIAGNOSIS — Z299 Encounter for prophylactic measures, unspecified: Secondary | ICD-10-CM | POA: Diagnosis not present

## 2022-07-06 ENCOUNTER — Other Ambulatory Visit (HOSPITAL_COMMUNITY): Payer: Self-pay | Admitting: *Deleted

## 2022-07-08 ENCOUNTER — Encounter (HOSPITAL_COMMUNITY)
Admission: RE | Admit: 2022-07-08 | Discharge: 2022-07-08 | Disposition: A | Discharge status: Home / Self Care | Payer: Medicare Other | Source: Ambulatory Visit | Attending: Nephrology | Admitting: Nephrology

## 2022-07-08 DIAGNOSIS — D631 Anemia in chronic kidney disease: Secondary | ICD-10-CM | POA: Insufficient documentation

## 2022-07-08 DIAGNOSIS — N189 Chronic kidney disease, unspecified: Secondary | ICD-10-CM | POA: Insufficient documentation

## 2022-07-08 MED ORDER — SODIUM CHLORIDE 0.9 % IV SOLN
510.0000 mg | INTRAVENOUS | Status: DC
  Administered 2022-07-08: 510 mg via INTRAVENOUS
  Filled 2022-07-08: qty 510

## 2022-07-13 DIAGNOSIS — D62 Acute posthemorrhagic anemia: Secondary | ICD-10-CM | POA: Diagnosis not present

## 2022-07-13 DIAGNOSIS — C50419 Malignant neoplasm of upper-outer quadrant of unspecified female breast: Secondary | ICD-10-CM | POA: Diagnosis not present

## 2022-07-13 DIAGNOSIS — G9341 Metabolic encephalopathy: Secondary | ICD-10-CM | POA: Diagnosis not present

## 2022-07-13 DIAGNOSIS — I700 Atherosclerosis of aorta: Secondary | ICD-10-CM | POA: Diagnosis not present

## 2022-07-13 DIAGNOSIS — N1832 Chronic kidney disease, stage 3b: Secondary | ICD-10-CM | POA: Diagnosis not present

## 2022-07-13 DIAGNOSIS — N179 Acute kidney failure, unspecified: Secondary | ICD-10-CM | POA: Diagnosis not present

## 2022-07-13 DIAGNOSIS — Z7901 Long term (current) use of anticoagulants: Secondary | ICD-10-CM | POA: Diagnosis not present

## 2022-07-13 DIAGNOSIS — K5792 Diverticulitis of intestine, part unspecified, without perforation or abscess without bleeding: Secondary | ICD-10-CM | POA: Diagnosis not present

## 2022-07-13 DIAGNOSIS — Z9181 History of falling: Secondary | ICD-10-CM | POA: Diagnosis not present

## 2022-07-13 DIAGNOSIS — I82402 Acute embolism and thrombosis of unspecified deep veins of left lower extremity: Secondary | ICD-10-CM | POA: Diagnosis not present

## 2022-07-13 DIAGNOSIS — I129 Hypertensive chronic kidney disease with stage 1 through stage 4 chronic kidney disease, or unspecified chronic kidney disease: Secondary | ICD-10-CM | POA: Diagnosis not present

## 2022-07-15 ENCOUNTER — Encounter: Payer: Self-pay | Admitting: Gastroenterology

## 2022-07-15 ENCOUNTER — Ambulatory Visit (INDEPENDENT_AMBULATORY_CARE_PROVIDER_SITE_OTHER): Payer: Medicare Other | Admitting: Gastroenterology

## 2022-07-15 VITALS — BP 138/78 | HR 80 | Ht 64.0 in | Wt 200.0 lb

## 2022-07-15 DIAGNOSIS — K5731 Diverticulosis of large intestine without perforation or abscess with bleeding: Secondary | ICD-10-CM

## 2022-07-15 DIAGNOSIS — I82402 Acute embolism and thrombosis of unspecified deep veins of left lower extremity: Secondary | ICD-10-CM | POA: Diagnosis not present

## 2022-07-15 DIAGNOSIS — Z9181 History of falling: Secondary | ICD-10-CM | POA: Diagnosis not present

## 2022-07-15 DIAGNOSIS — G9341 Metabolic encephalopathy: Secondary | ICD-10-CM | POA: Diagnosis not present

## 2022-07-15 DIAGNOSIS — N1832 Chronic kidney disease, stage 3b: Secondary | ICD-10-CM | POA: Diagnosis not present

## 2022-07-15 DIAGNOSIS — K5792 Diverticulitis of intestine, part unspecified, without perforation or abscess without bleeding: Secondary | ICD-10-CM | POA: Diagnosis not present

## 2022-07-15 DIAGNOSIS — I129 Hypertensive chronic kidney disease with stage 1 through stage 4 chronic kidney disease, or unspecified chronic kidney disease: Secondary | ICD-10-CM | POA: Diagnosis not present

## 2022-07-15 DIAGNOSIS — C50419 Malignant neoplasm of upper-outer quadrant of unspecified female breast: Secondary | ICD-10-CM | POA: Diagnosis not present

## 2022-07-15 DIAGNOSIS — K625 Hemorrhage of anus and rectum: Secondary | ICD-10-CM

## 2022-07-15 DIAGNOSIS — Z7901 Long term (current) use of anticoagulants: Secondary | ICD-10-CM | POA: Diagnosis not present

## 2022-07-15 DIAGNOSIS — D62 Acute posthemorrhagic anemia: Secondary | ICD-10-CM

## 2022-07-15 DIAGNOSIS — N179 Acute kidney failure, unspecified: Secondary | ICD-10-CM | POA: Diagnosis not present

## 2022-07-15 DIAGNOSIS — I700 Atherosclerosis of aorta: Secondary | ICD-10-CM | POA: Diagnosis not present

## 2022-07-15 NOTE — Progress Notes (Signed)
[facility] Progress Note  Chief Complaint: Follow-up  Subjective  History: [name] [name] is a 81 year old woman admitted to the hospital last month for hematochezia and acute blood anemia.  She was actually seen in consultation by Dr. Carol Ada, and then I saw her in weekend cross coverage, performing her colonoscopy and a follow-up the day afterward.  She was then seen by Dr. Collene Mares in follow-up, and it appears the hospitalist recommended she follow-up with me afterward. She initially went to [facility] where the plans were to transfer her to [facility] in [place], but she actually left against advice and came to the [facility] long ED.  Her initial CT scan suggested possible left-sided colitis that Dr. Benson Norway thought might be ischemic.  No ischemic colitis was found by colonoscopy, but she did have purulent discharge from a diverticular opening, consistent with colitis, so I started her on IV ciprofloxacin and Flagyl.  She had been on Eliquis at the time of admission which was restarted just prior to discharge.  It looks like she was also given an IV iron infusion a week ago. Incidentally, discharge summary indicates that her acute kidney injury on top of stage III CKD was probably prerenal azotemia from acute GI bleeding on top of a recently diagnosed hydralazine induced glomerulonephritis for which she was being followed by Dr. Marval Regal of [facility].  She was on prednisone for that diagnosis.  Sulamita was here with her daughter Suanne Marker, who called our office and made this appointment out of concern for recent rectal bleeding.  (She had my name from the colonoscopy report)  There has not been recurrence of the bleeding that brought her to the hospital last month. There was brief constipation shortly after returning home from the hospital, but since then, bowels have been reportedly normal because her daughters have been preparing her food and giving her lots of fruits and  vegetables.  She is ambulatory, though came in a wheelchair today because she was fatigued and it was a long walk from the car.  The prednisone has also been giving her some weakness lately, glad she is weaning off it. Her nephrologist has apparently been following her blood counts and made the arrangements for recent iron treatments, and she has blood work coming up with them in about 2 weeks.  About 10 days ago there was a day or 2 of some blood streaked on the stool followed by some in the toilet bowl.  She was good for several days and then another episode happened last week.  She has not been complaining of abdominal or rectal pain. ROS: Cardiovascular:  no chest pain Respiratory: no dyspnea  The patient's Past Medical, Family and Social History were reviewed and are on file in the EMR.  Objective:  Med list reviewed  Current Outpatient Medications:    amLODipine (NORVASC) 10 MG tablet, Take 10 mg by mouth daily., Disp: , Rfl:    anastrozole (ARIMIDEX) 1 MG tablet, Take 1 mg by mouth daily., Disp: , Rfl:    apixaban (ELIQUIS) 5 MG TABS tablet, Take 1 tablet (5 mg total) by mouth 2 (two) times daily., Disp: 60 tablet, Rfl: 2   Cholecalciferol 25 MCG (1000 UT) tablet, Take 1,000 Units by mouth every other day., Disp: , Rfl:    COMBIGAN 0.2-0.5 % ophthalmic solution, Place 1 drop into both eyes every 12 (twelve) hours., Disp: , Rfl:    furosemide (LASIX) 40 MG tablet, Take 1 tablet (40 mg  total) by mouth daily., Disp: 30 tablet, Rfl: 0   metoprolol tartrate (LOPRESSOR) 25 MG tablet, Take 1 tablet (25 mg total) by mouth 2 (two) times daily., Disp: 60 tablet, Rfl: 0   pantoprazole (PROTONIX) 40 MG tablet, Take 40 mg by mouth daily., Disp: , Rfl:    predniSONE (DELTASONE) 20 MG tablet, Take 20 mg by mouth daily with breakfast., Disp: , Rfl:    ROCKLATAN 0.02-0.005 % SOLN, Apply 1 drop to eye at bedtime., Disp: , Rfl:    Vital signs in last 24 hrs: Vitals:   07/15/22 1522  BP: 138/78   Pulse: 80   Wt Readings from Last 3 Encounters:  07/15/22 200 lb (90.7 kg)  06/13/22 203 lb 14.8 oz (92.5 kg)  06/04/22 205 lb 14.6 oz (93.4 kg)    Physical Exam  Fatigued, became more animated after getting choked. Cardiac: Regular without murmur,  + peripheral edema bilaterally Pulm: clear to auscultation bilaterally, normal RR and effort noted Abdomen: soft, no tenderness, with active bowel sounds. No guarding or palpable hepatosplenomegaly. (Limited exam and wheelchair)  Labs:   ___________________________________________ Radiologic studies:   ____________________________________________ Other:   _____________________________________________ Assessment & Plan  Assessment: Encounter Diagnoses  Name Primary?   Diverticulosis of colon with hemorrhage Yes   Acute blood loss anemia    Rectal bleeding    Recent episode of diverticular bleeding with concomitant diverticulitis, episode now resolved.  Acute blood loss anemia that was probably on top of chronic CKD related anemia.  Her nephrologist is apparently following that and arranging IV iron.  Recent small-volume rectal bleeding that sounds most likely hemorrhoidal.  Looking back at my photographs from the colonoscopy, although the prep was fair, there are probably some small internal hemorrhoids.  This may have at least been initially precipitated by some brief post hospital constipation.   Plan: I would manage this expectantly at this point, with continuation of high-fiber diet as her daughters have been providing as well as as needed use of preparation H suppository.  From what I saw: Last week, the hemorrhoidal columns are likely been large enough to be amenable to banding. Mainly, her daughter was concerned that this could be a recurrence of the diverticular problem, which does not appear to be the case.  In addition, she was concerned that perhaps they were feeding her the wrong things as regards the  diverticulosis, and I reassured her that is not the case.  They will call or have Britlee see me again as needed.  Nelida Meuse III

## 2022-07-15 NOTE — Patient Instructions (Signed)
_______________________________________________________  If you are age 79 or older, your body mass index should be between 23-30. Your Body mass index is 34.33 kg/m. If this is out of the aforementioned range listed, please consider follow up with your Primary Care Provider.  If you are age 55 or younger, your body mass index should be between 19-25. Your Body mass index is 34.33 kg/m. If this is out of the aformentioned range listed, please consider follow up with your Primary Care Provider.   ________________________________________________________  The [facility] providers would like to encourage you to use [facility] to communicate with providers for non-urgent requests or questions.  Due to long hold times on the telephone, sending your provider a message by [facility] may be a faster and more efficient way to get a response.  Please allow 48 business hours for a response.  Please remember that this is for non-urgent requests.  _______________________________________________________  It was a pleasure to see you today!  Thank you for trusting me with your gastrointestinal care!

## 2022-07-16 ENCOUNTER — Encounter (HOSPITAL_COMMUNITY)
Admission: RE | Admit: 2022-07-16 | Discharge: 2022-07-16 | Disposition: A | Discharge status: Home / Self Care | Payer: Medicare Other | Source: Ambulatory Visit | Attending: Nephrology | Admitting: Nephrology

## 2022-07-16 DIAGNOSIS — D631 Anemia in chronic kidney disease: Secondary | ICD-10-CM | POA: Diagnosis not present

## 2022-07-16 DIAGNOSIS — N189 Chronic kidney disease, unspecified: Secondary | ICD-10-CM | POA: Diagnosis not present

## 2022-07-16 MED ORDER — SODIUM CHLORIDE 0.9 % IV SOLN
510.0000 mg | INTRAVENOUS | Status: AC
  Administered 2022-07-16: 510 mg via INTRAVENOUS
  Filled 2022-07-16: qty 510

## 2022-07-22 DIAGNOSIS — I10 Essential (primary) hypertension: Secondary | ICD-10-CM | POA: Diagnosis not present

## 2022-07-22 DIAGNOSIS — Z299 Encounter for prophylactic measures, unspecified: Secondary | ICD-10-CM | POA: Diagnosis not present

## 2022-07-22 DIAGNOSIS — Z789 Other specified health status: Secondary | ICD-10-CM | POA: Diagnosis not present

## 2022-07-22 DIAGNOSIS — N047 Nephrotic syndrome with diffuse crescentic glomerulonephritis: Secondary | ICD-10-CM | POA: Diagnosis not present

## 2022-07-23 DIAGNOSIS — N179 Acute kidney failure, unspecified: Secondary | ICD-10-CM | POA: Diagnosis not present

## 2022-07-23 DIAGNOSIS — N189 Chronic kidney disease, unspecified: Secondary | ICD-10-CM | POA: Diagnosis not present

## 2022-07-25 DIAGNOSIS — I82402 Acute embolism and thrombosis of unspecified deep veins of left lower extremity: Secondary | ICD-10-CM | POA: Diagnosis not present

## 2022-07-25 DIAGNOSIS — D62 Acute posthemorrhagic anemia: Secondary | ICD-10-CM | POA: Diagnosis not present

## 2022-07-25 DIAGNOSIS — Z7901 Long term (current) use of anticoagulants: Secondary | ICD-10-CM | POA: Diagnosis not present

## 2022-07-25 DIAGNOSIS — C50419 Malignant neoplasm of upper-outer quadrant of unspecified female breast: Secondary | ICD-10-CM | POA: Diagnosis not present

## 2022-07-25 DIAGNOSIS — N179 Acute kidney failure, unspecified: Secondary | ICD-10-CM | POA: Diagnosis not present

## 2022-07-25 DIAGNOSIS — K5792 Diverticulitis of intestine, part unspecified, without perforation or abscess without bleeding: Secondary | ICD-10-CM | POA: Diagnosis not present

## 2022-07-25 DIAGNOSIS — Z9181 History of falling: Secondary | ICD-10-CM | POA: Diagnosis not present

## 2022-07-25 DIAGNOSIS — I129 Hypertensive chronic kidney disease with stage 1 through stage 4 chronic kidney disease, or unspecified chronic kidney disease: Secondary | ICD-10-CM | POA: Diagnosis not present

## 2022-07-25 DIAGNOSIS — G9341 Metabolic encephalopathy: Secondary | ICD-10-CM | POA: Diagnosis not present

## 2022-07-25 DIAGNOSIS — I700 Atherosclerosis of aorta: Secondary | ICD-10-CM | POA: Diagnosis not present

## 2022-07-25 DIAGNOSIS — N1832 Chronic kidney disease, stage 3b: Secondary | ICD-10-CM | POA: Diagnosis not present

## 2022-07-27 DIAGNOSIS — I129 Hypertensive chronic kidney disease with stage 1 through stage 4 chronic kidney disease, or unspecified chronic kidney disease: Secondary | ICD-10-CM | POA: Diagnosis not present

## 2022-07-27 DIAGNOSIS — Z79899 Other long term (current) drug therapy: Secondary | ICD-10-CM | POA: Diagnosis not present

## 2022-07-27 DIAGNOSIS — D849 Immunodeficiency, unspecified: Secondary | ICD-10-CM | POA: Diagnosis not present

## 2022-07-27 DIAGNOSIS — N058 Unspecified nephritic syndrome with other morphologic changes: Secondary | ICD-10-CM | POA: Diagnosis not present

## 2022-07-27 DIAGNOSIS — N057 Unspecified nephritic syndrome with diffuse crescentic glomerulonephritis: Secondary | ICD-10-CM | POA: Diagnosis not present

## 2022-07-27 DIAGNOSIS — D631 Anemia in chronic kidney disease: Secondary | ICD-10-CM | POA: Diagnosis not present

## 2022-07-27 DIAGNOSIS — N390 Urinary tract infection, site not specified: Secondary | ICD-10-CM | POA: Diagnosis not present

## 2022-07-27 DIAGNOSIS — N179 Acute kidney failure, unspecified: Secondary | ICD-10-CM | POA: Diagnosis not present

## 2022-07-27 DIAGNOSIS — N189 Chronic kidney disease, unspecified: Secondary | ICD-10-CM | POA: Diagnosis not present

## 2022-07-30 DIAGNOSIS — G9341 Metabolic encephalopathy: Secondary | ICD-10-CM | POA: Diagnosis not present

## 2022-07-30 DIAGNOSIS — Z9181 History of falling: Secondary | ICD-10-CM | POA: Diagnosis not present

## 2022-07-30 DIAGNOSIS — C50419 Malignant neoplasm of upper-outer quadrant of unspecified female breast: Secondary | ICD-10-CM | POA: Diagnosis not present

## 2022-07-30 DIAGNOSIS — D62 Acute posthemorrhagic anemia: Secondary | ICD-10-CM | POA: Diagnosis not present

## 2022-07-30 DIAGNOSIS — I82402 Acute embolism and thrombosis of unspecified deep veins of left lower extremity: Secondary | ICD-10-CM | POA: Diagnosis not present

## 2022-07-30 DIAGNOSIS — N1832 Chronic kidney disease, stage 3b: Secondary | ICD-10-CM | POA: Diagnosis not present

## 2022-07-30 DIAGNOSIS — Z7901 Long term (current) use of anticoagulants: Secondary | ICD-10-CM | POA: Diagnosis not present

## 2022-07-30 DIAGNOSIS — I700 Atherosclerosis of aorta: Secondary | ICD-10-CM | POA: Diagnosis not present

## 2022-07-30 DIAGNOSIS — K5792 Diverticulitis of intestine, part unspecified, without perforation or abscess without bleeding: Secondary | ICD-10-CM | POA: Diagnosis not present

## 2022-07-30 DIAGNOSIS — I129 Hypertensive chronic kidney disease with stage 1 through stage 4 chronic kidney disease, or unspecified chronic kidney disease: Secondary | ICD-10-CM | POA: Diagnosis not present

## 2022-08-05 DIAGNOSIS — N1832 Chronic kidney disease, stage 3b: Secondary | ICD-10-CM | POA: Diagnosis not present

## 2022-08-05 DIAGNOSIS — K5792 Diverticulitis of intestine, part unspecified, without perforation or abscess without bleeding: Secondary | ICD-10-CM | POA: Diagnosis not present

## 2022-08-05 DIAGNOSIS — I82402 Acute embolism and thrombosis of unspecified deep veins of left lower extremity: Secondary | ICD-10-CM | POA: Diagnosis not present

## 2022-08-05 DIAGNOSIS — I129 Hypertensive chronic kidney disease with stage 1 through stage 4 chronic kidney disease, or unspecified chronic kidney disease: Secondary | ICD-10-CM | POA: Diagnosis not present

## 2022-08-05 DIAGNOSIS — Z7901 Long term (current) use of anticoagulants: Secondary | ICD-10-CM | POA: Diagnosis not present

## 2022-08-05 DIAGNOSIS — I700 Atherosclerosis of aorta: Secondary | ICD-10-CM | POA: Diagnosis not present

## 2022-08-05 DIAGNOSIS — G9341 Metabolic encephalopathy: Secondary | ICD-10-CM | POA: Diagnosis not present

## 2022-08-05 DIAGNOSIS — C50419 Malignant neoplasm of upper-outer quadrant of unspecified female breast: Secondary | ICD-10-CM | POA: Diagnosis not present

## 2022-08-05 DIAGNOSIS — D62 Acute posthemorrhagic anemia: Secondary | ICD-10-CM | POA: Diagnosis not present

## 2022-08-05 DIAGNOSIS — Z9181 History of falling: Secondary | ICD-10-CM | POA: Diagnosis not present

## 2022-08-11 IMAGING — CT CT BIOPSY CORE RENAL
1 of 3 series · 11 of 32 positions shown, 17 images · non-contrast
Comparison: None Available.

CLINICAL DATA: Renal insufficiency, biopsy requested

EXAM:
ULTRASOUND AND CT-GUIDED CORE RENAL BIOPSY
TECHNIQUE: Survey ultrasound was performed and an appropriate skin entry site
was localized. Site was marked, prepped with chlorhexidine, draped
in usual sterile fashion, infiltrated locally with 1% lidocaine.

[Series 3: i-spiral 5.0 b40f · axial · 0.84mm/px · z∈[+1027,+1191]mm · 11 of 57 slices shown, 17 images]
[im 5/57  soft-tissue]
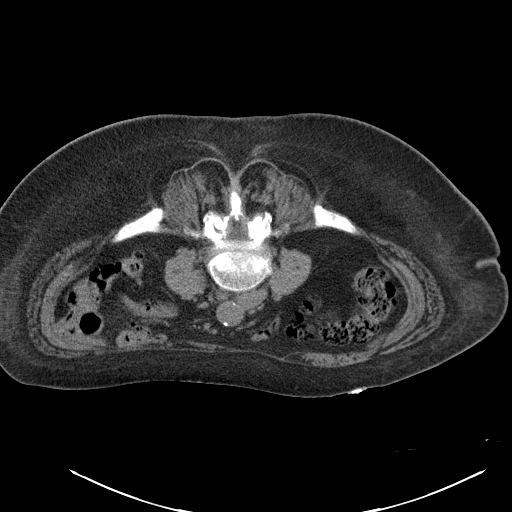
[im 5/57  bone]
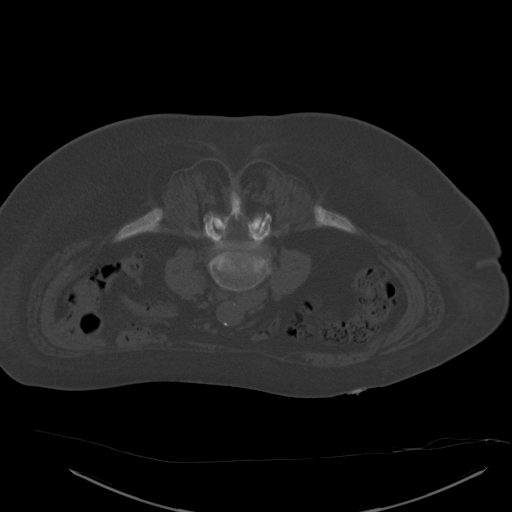
[im 10/57  soft-tissue]
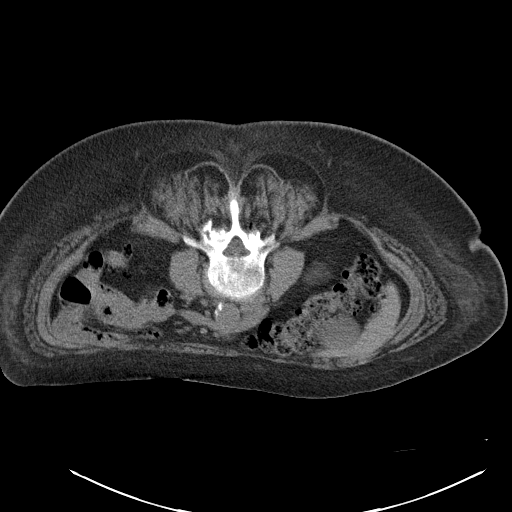
[im 15/57  soft-tissue]
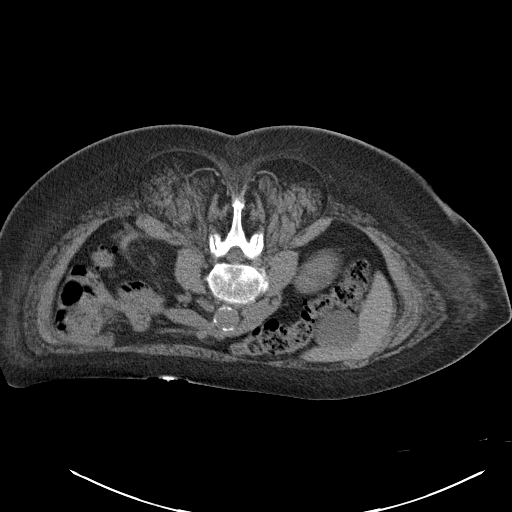
[im 19/57  soft-tissue]
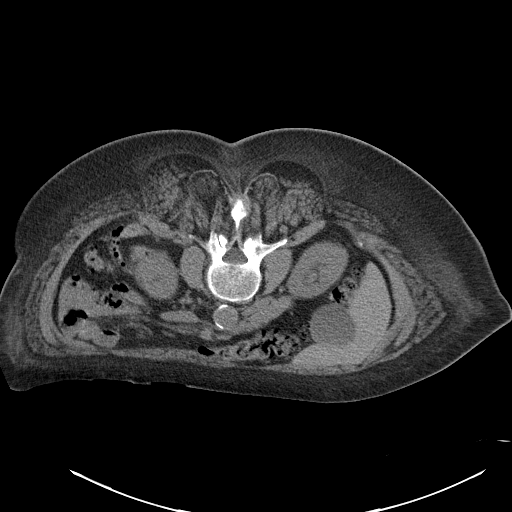
[im 24/57  soft-tissue]
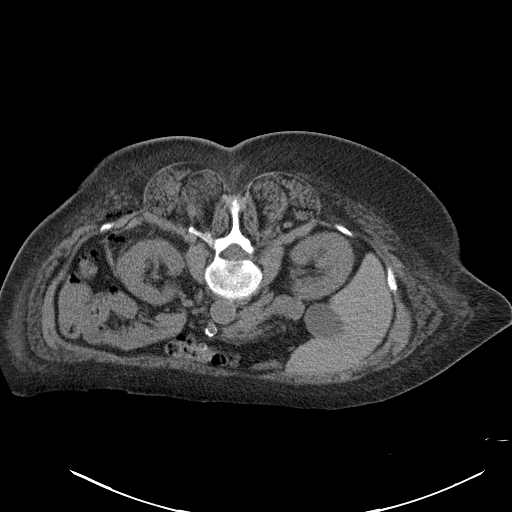
[im 29/57  soft-tissue]
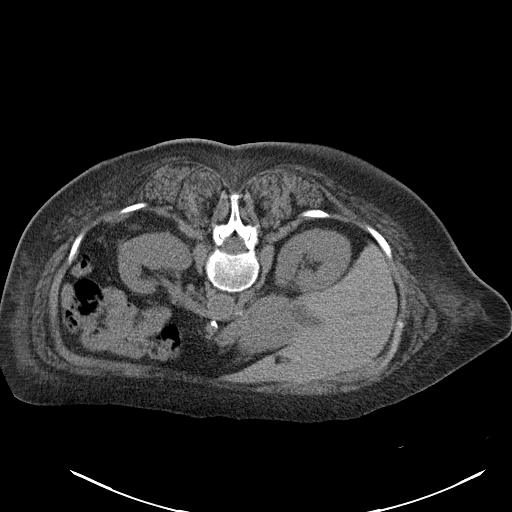
[im 33/57  soft-tissue]
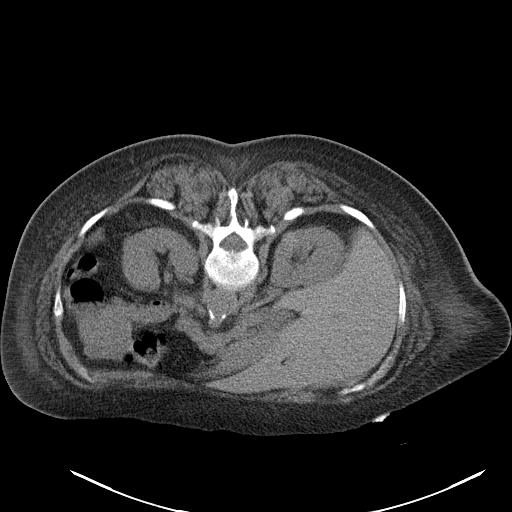
[im 38/57  soft-tissue]
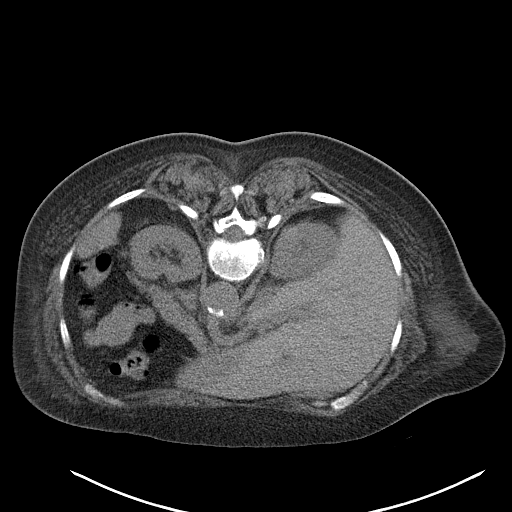
[im 38/57  lung]
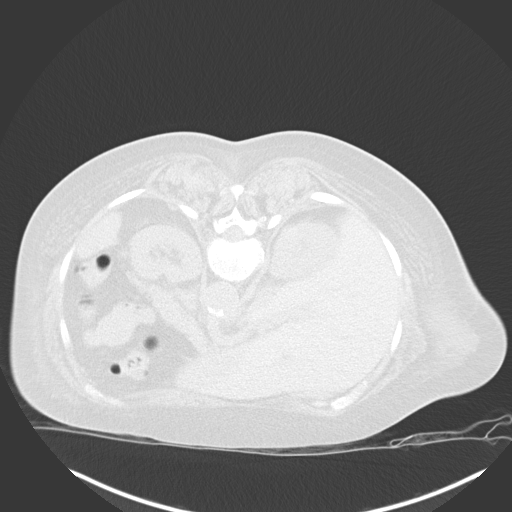
[im 43/57  soft-tissue]
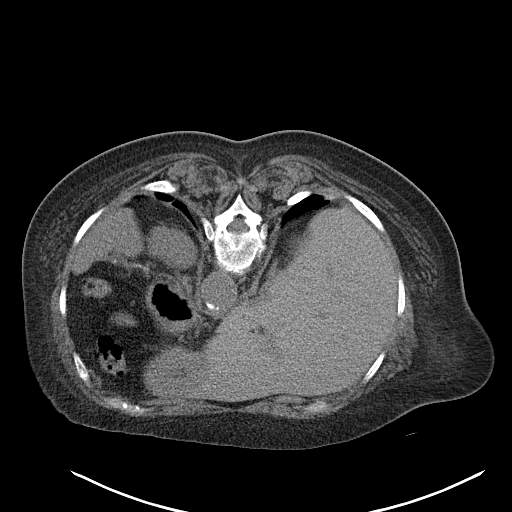
[im 43/57  lung]
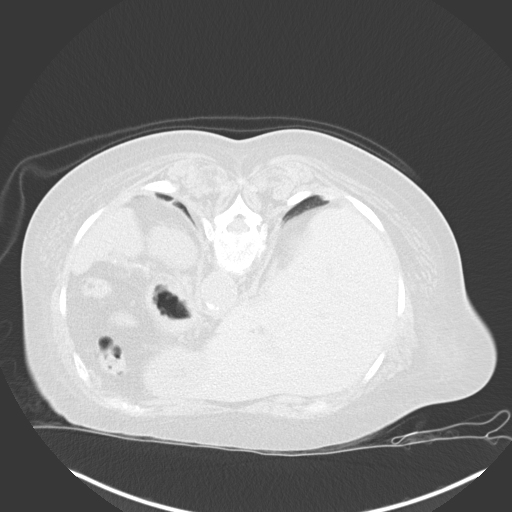
[im 43/57  bone]
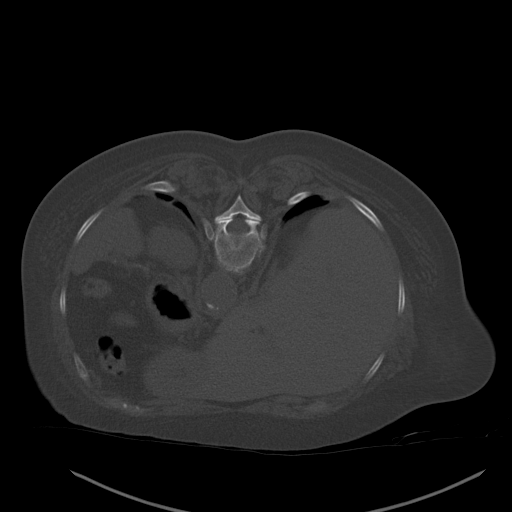
[im 47/57  soft-tissue]
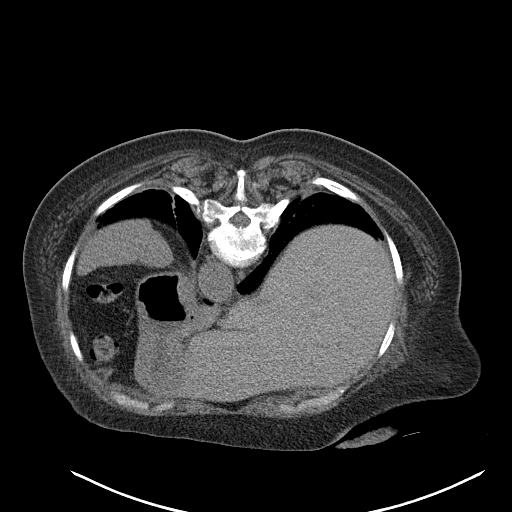
[im 47/57  lung]
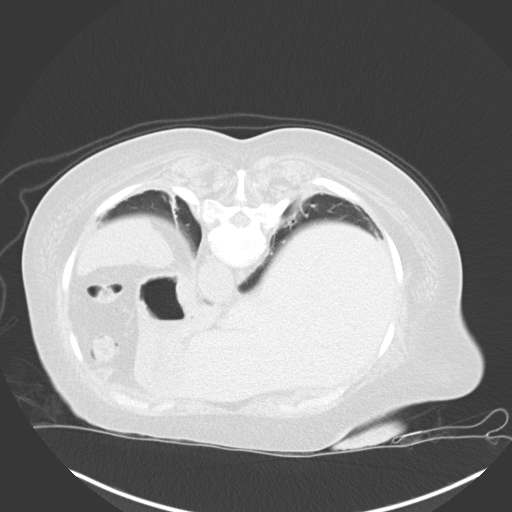
[im 52/57  soft-tissue]
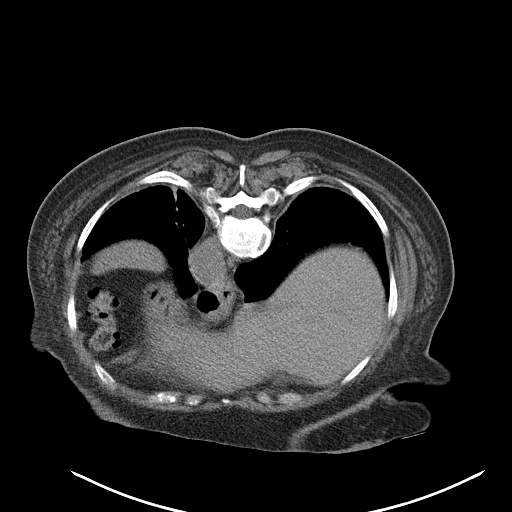
[im 52/57  lung]
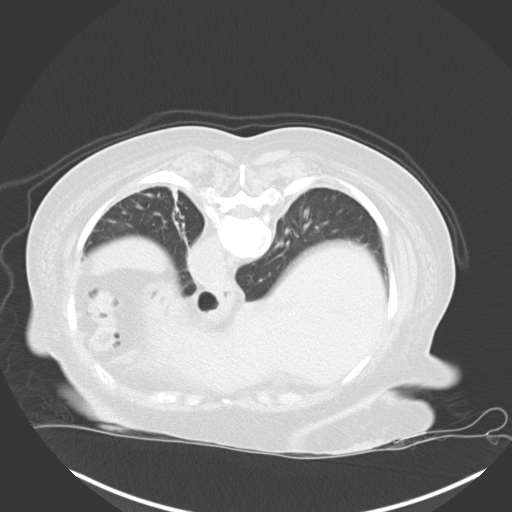

[11 of 32 positions shown; findings below may reference images not displayed]

Intravenous Fentanyl 18mcg and Versed 1.5mg were administered as
conscious sedation during continuous monitoring of the patient's
level of consciousness and physiological / cardiorespiratory status
by the radiology RN, with a total moderate sedation time of 78
minutes.

Under real time ultrasound guidance, a 15 gauge trocar needle was
advanced to the margin of the lower pole of the left kidney for 2
coaxial 16 gauge core biopsy needle passes. The first was mainly
fat. The second was small. Subsequently, there was limited
visualization precluding additional safe core biopsies with
ultrasound. For this reason, patient was transferred to CT, placed
prone, and limited axial scans obtained to localize the lower pole
left kidney again. There was only minimal regional hemorrhage.
Subsequently, after the region was prepped and draped and
infiltrated with 1% lidocaine, the 15 gauge needle was advanced to
the margin of the lower pole left kidney again and additional core
samples were obtained which appeared grossly adequate. Postprocedure
scan shows no significant hemorrhage or other apparent complication.
The patient tolerated procedure well.

COMPLICATIONS:
None.
IMPRESSION: 1. Technically successful ultrasound- and CT-guided core renal
biopsy , left lower pole.

## 2022-08-11 IMAGING — US US BIOPSY
1 series · 13 of 14 positions shown · non-contrast
Comparison: None Available.

CLINICAL DATA: Renal insufficiency, biopsy requested

EXAM:
ULTRASOUND AND CT-GUIDED CORE RENAL BIOPSY
TECHNIQUE: Survey ultrasound was performed and an appropriate skin entry site
was localized. Site was marked, prepped with chlorhexidine, draped
in usual sterile fashion, infiltrated locally with 1% lidocaine.

[Series 1: us biopsy (kidney) · 13 of 14 slices shown]
[im 1/14]
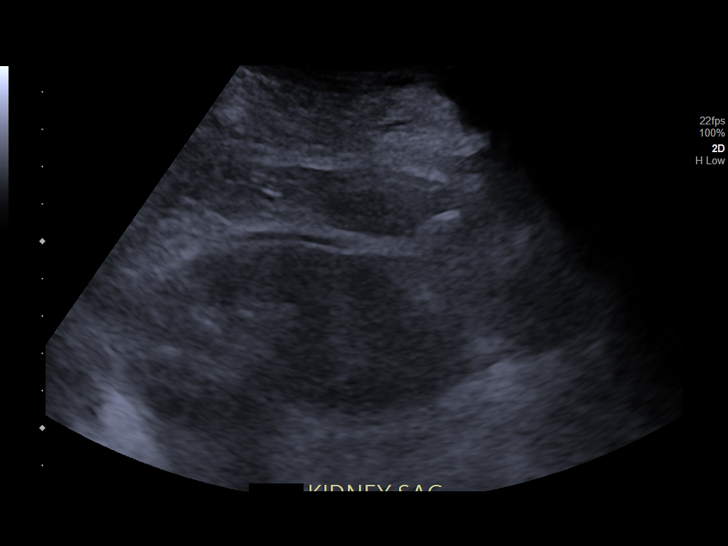
[im 2/14]
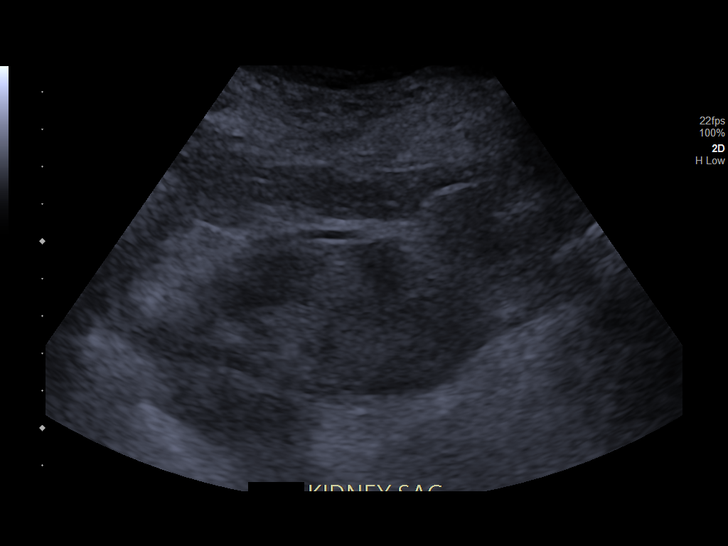
[im 3/14]
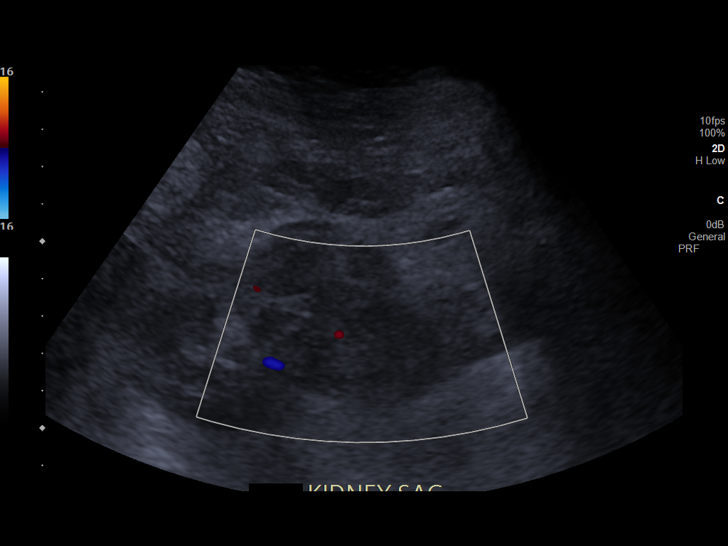
[im 4/14]
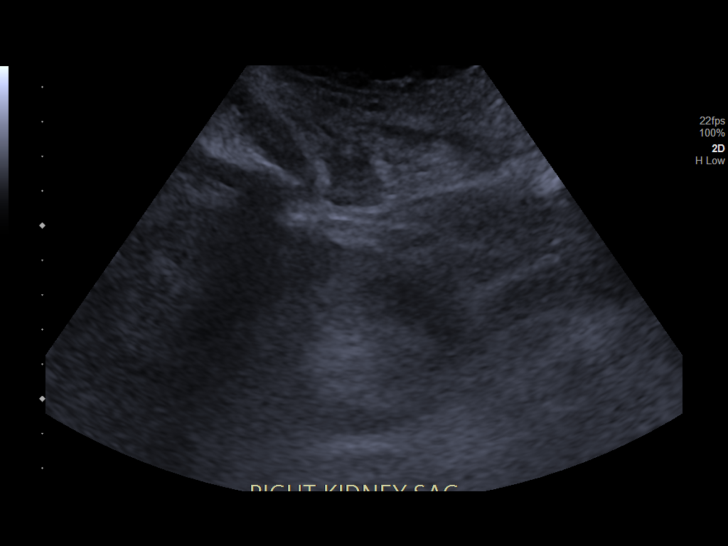
[im 5/14]
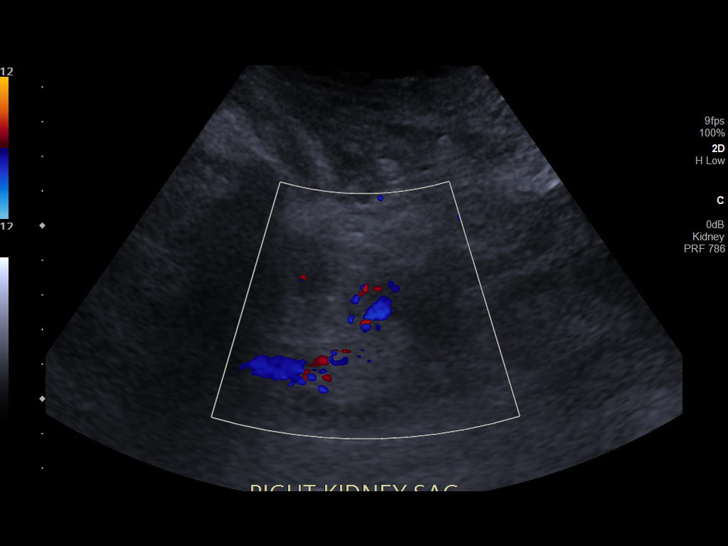
[im 6/14]
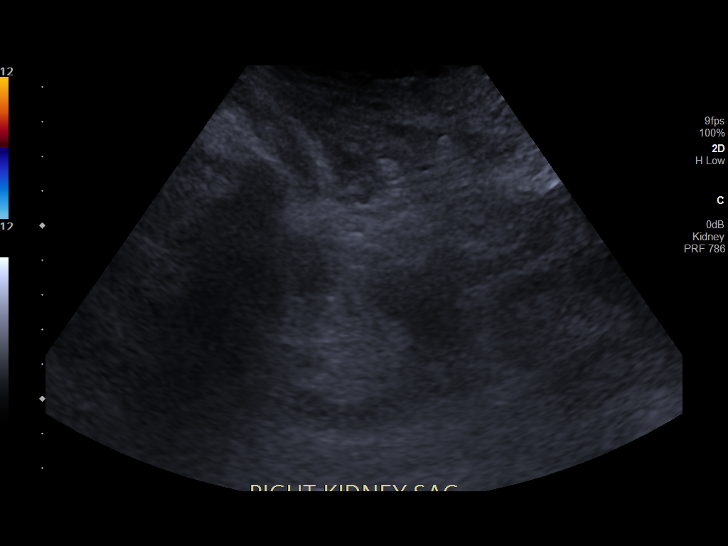
[im 8/14]
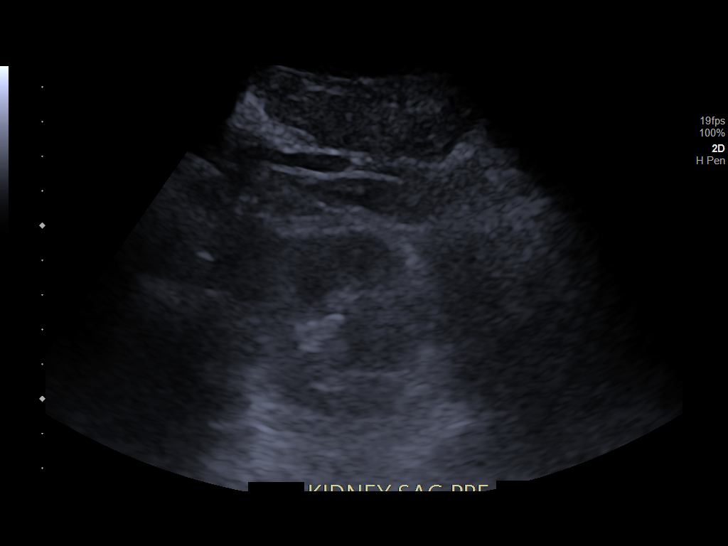
[im 9/14]
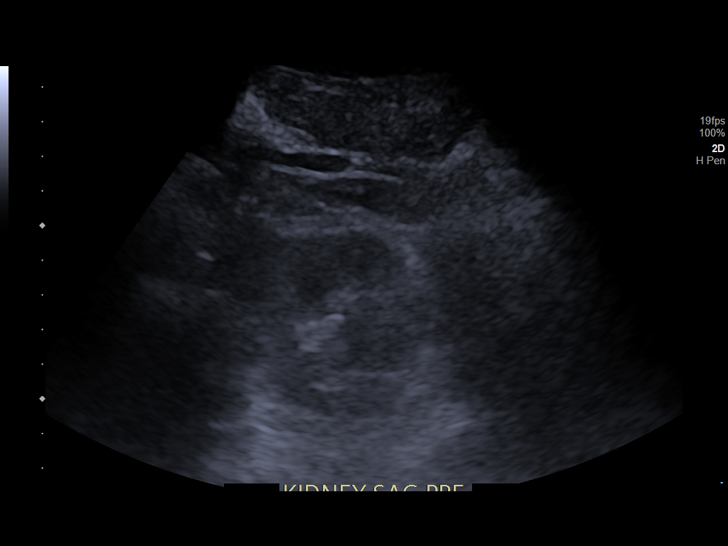
[im 10/14]
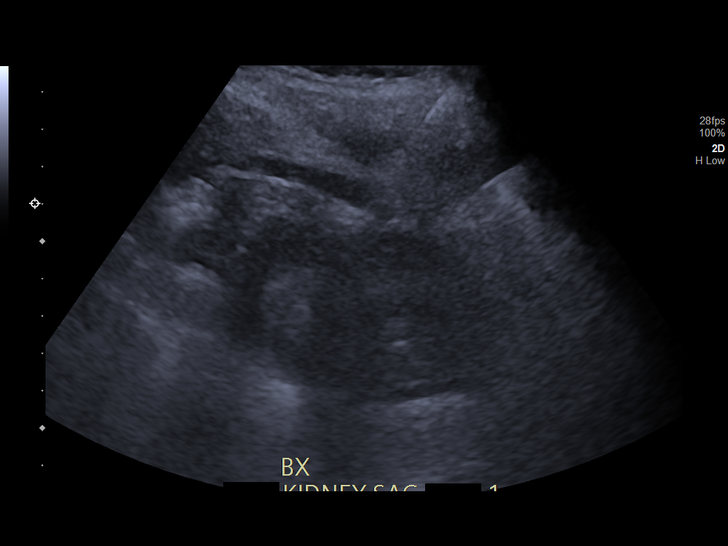
[im 11/14]
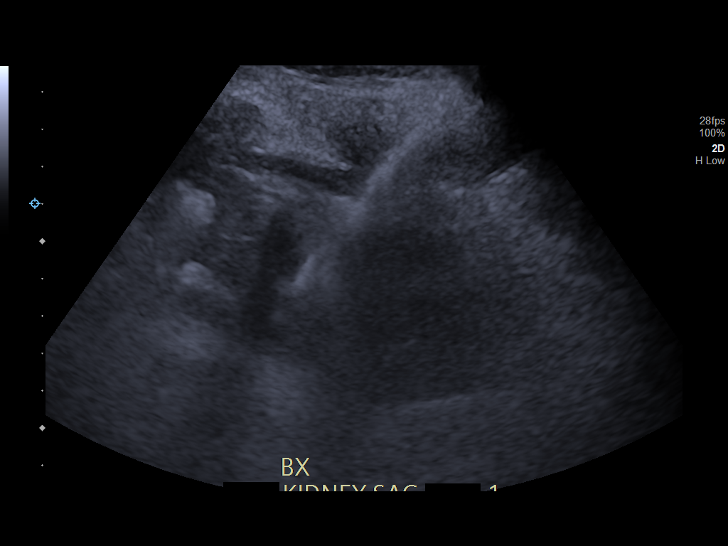
[im 12/14]
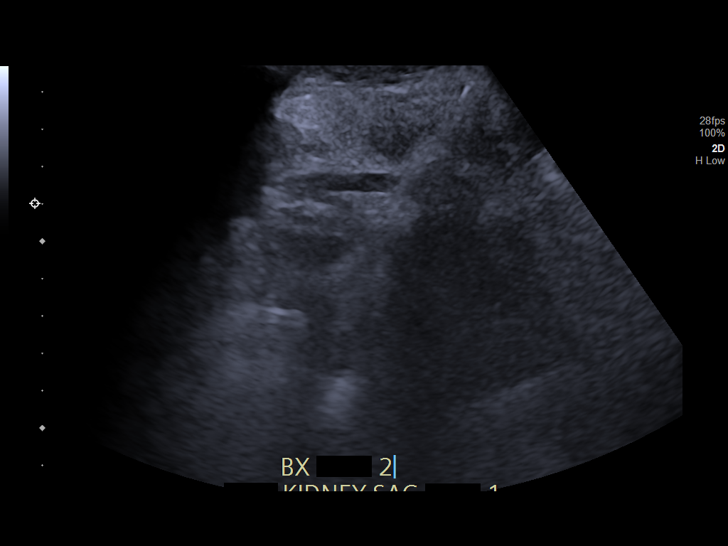
[im 13/14]
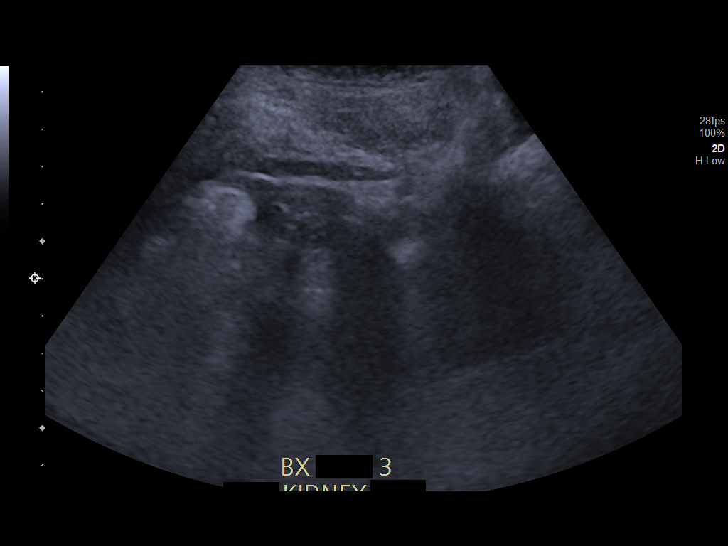
[im 14/14]
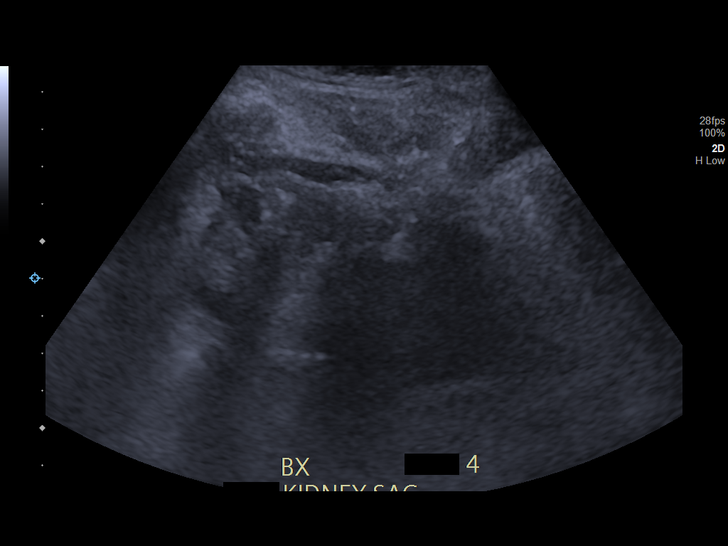

[13 of 14 positions shown; findings below may reference images not displayed]

Intravenous Fentanyl 18mcg and Versed 1.5mg were administered as
conscious sedation during continuous monitoring of the patient's
level of consciousness and physiological / cardiorespiratory status
by the radiology RN, with a total moderate sedation time of 78
minutes.

Under real time ultrasound guidance, a 15 gauge trocar needle was
advanced to the margin of the lower pole of the left kidney for 2
coaxial 16 gauge core biopsy needle passes. The first was mainly
fat. The second was small. Subsequently, there was limited
visualization precluding additional safe core biopsies with
ultrasound. For this reason, patient was transferred to CT, placed
prone, and limited axial scans obtained to localize the lower pole
left kidney again. There was only minimal regional hemorrhage.
Subsequently, after the region was prepped and draped and
infiltrated with 1% lidocaine, the 15 gauge needle was advanced to
the margin of the lower pole left kidney again and additional core
samples were obtained which appeared grossly adequate. Postprocedure
scan shows no significant hemorrhage or other apparent complication.
The patient tolerated procedure well.

COMPLICATIONS:
None.
IMPRESSION: 1. Technically successful ultrasound- and CT-guided core renal
biopsy , left lower pole.

## 2022-08-12 DIAGNOSIS — Z9181 History of falling: Secondary | ICD-10-CM | POA: Diagnosis not present

## 2022-08-12 DIAGNOSIS — N1832 Chronic kidney disease, stage 3b: Secondary | ICD-10-CM | POA: Diagnosis not present

## 2022-08-12 DIAGNOSIS — K5792 Diverticulitis of intestine, part unspecified, without perforation or abscess without bleeding: Secondary | ICD-10-CM | POA: Diagnosis not present

## 2022-08-12 DIAGNOSIS — G9341 Metabolic encephalopathy: Secondary | ICD-10-CM | POA: Diagnosis not present

## 2022-08-12 DIAGNOSIS — I700 Atherosclerosis of aorta: Secondary | ICD-10-CM | POA: Diagnosis not present

## 2022-08-12 DIAGNOSIS — I129 Hypertensive chronic kidney disease with stage 1 through stage 4 chronic kidney disease, or unspecified chronic kidney disease: Secondary | ICD-10-CM | POA: Diagnosis not present

## 2022-08-12 DIAGNOSIS — D62 Acute posthemorrhagic anemia: Secondary | ICD-10-CM | POA: Diagnosis not present

## 2022-08-12 DIAGNOSIS — Z7901 Long term (current) use of anticoagulants: Secondary | ICD-10-CM | POA: Diagnosis not present

## 2022-08-12 DIAGNOSIS — I82402 Acute embolism and thrombosis of unspecified deep veins of left lower extremity: Secondary | ICD-10-CM | POA: Diagnosis not present

## 2022-08-12 DIAGNOSIS — C50419 Malignant neoplasm of upper-outer quadrant of unspecified female breast: Secondary | ICD-10-CM | POA: Diagnosis not present

## 2022-08-20 DIAGNOSIS — R1032 Left lower quadrant pain: Secondary | ICD-10-CM | POA: Diagnosis not present

## 2022-08-20 DIAGNOSIS — I10 Essential (primary) hypertension: Secondary | ICD-10-CM | POA: Diagnosis not present

## 2022-08-20 DIAGNOSIS — I739 Peripheral vascular disease, unspecified: Secondary | ICD-10-CM | POA: Diagnosis not present

## 2022-08-20 DIAGNOSIS — Z299 Encounter for prophylactic measures, unspecified: Secondary | ICD-10-CM | POA: Diagnosis not present

## 2022-08-20 DIAGNOSIS — N390 Urinary tract infection, site not specified: Secondary | ICD-10-CM | POA: Diagnosis not present

## 2022-08-24 DIAGNOSIS — E7800 Pure hypercholesterolemia, unspecified: Secondary | ICD-10-CM | POA: Diagnosis not present

## 2022-08-24 DIAGNOSIS — U071 COVID-19: Secondary | ICD-10-CM | POA: Diagnosis not present

## 2022-08-24 DIAGNOSIS — I700 Atherosclerosis of aorta: Secondary | ICD-10-CM | POA: Diagnosis not present

## 2022-08-27 DIAGNOSIS — E559 Vitamin D deficiency, unspecified: Secondary | ICD-10-CM | POA: Diagnosis not present

## 2022-08-27 DIAGNOSIS — N390 Urinary tract infection, site not specified: Secondary | ICD-10-CM | POA: Diagnosis not present

## 2022-08-27 DIAGNOSIS — N179 Acute kidney failure, unspecified: Secondary | ICD-10-CM | POA: Diagnosis not present

## 2022-08-28 DIAGNOSIS — G9341 Metabolic encephalopathy: Secondary | ICD-10-CM | POA: Diagnosis not present

## 2022-08-28 DIAGNOSIS — N3000 Acute cystitis without hematuria: Secondary | ICD-10-CM | POA: Diagnosis not present

## 2022-08-28 DIAGNOSIS — I10 Essential (primary) hypertension: Secondary | ICD-10-CM | POA: Diagnosis not present

## 2022-08-28 DIAGNOSIS — I82403 Acute embolism and thrombosis of unspecified deep veins of lower extremity, bilateral: Secondary | ICD-10-CM | POA: Diagnosis not present

## 2022-08-31 DIAGNOSIS — Z7189 Other specified counseling: Secondary | ICD-10-CM | POA: Diagnosis not present

## 2022-08-31 DIAGNOSIS — D508 Other iron deficiency anemias: Secondary | ICD-10-CM | POA: Diagnosis not present

## 2022-08-31 DIAGNOSIS — E559 Vitamin D deficiency, unspecified: Secondary | ICD-10-CM | POA: Diagnosis not present

## 2022-08-31 DIAGNOSIS — Z170 Estrogen receptor positive status [ER+]: Secondary | ICD-10-CM | POA: Diagnosis not present

## 2022-08-31 DIAGNOSIS — C50012 Malignant neoplasm of nipple and areola, left female breast: Secondary | ICD-10-CM | POA: Diagnosis not present

## 2022-09-01 DIAGNOSIS — N179 Acute kidney failure, unspecified: Secondary | ICD-10-CM | POA: Diagnosis not present

## 2022-09-01 DIAGNOSIS — D631 Anemia in chronic kidney disease: Secondary | ICD-10-CM | POA: Diagnosis not present

## 2022-09-01 DIAGNOSIS — D849 Immunodeficiency, unspecified: Secondary | ICD-10-CM | POA: Diagnosis not present

## 2022-09-01 DIAGNOSIS — Z79899 Other long term (current) drug therapy: Secondary | ICD-10-CM | POA: Diagnosis not present

## 2022-09-01 DIAGNOSIS — N057 Unspecified nephritic syndrome with diffuse crescentic glomerulonephritis: Secondary | ICD-10-CM | POA: Diagnosis not present

## 2022-09-01 DIAGNOSIS — N058 Unspecified nephritic syndrome with other morphologic changes: Secondary | ICD-10-CM | POA: Diagnosis not present

## 2022-09-01 DIAGNOSIS — N189 Chronic kidney disease, unspecified: Secondary | ICD-10-CM | POA: Diagnosis not present

## 2022-09-01 DIAGNOSIS — I129 Hypertensive chronic kidney disease with stage 1 through stage 4 chronic kidney disease, or unspecified chronic kidney disease: Secondary | ICD-10-CM | POA: Diagnosis not present

## 2022-09-04 DIAGNOSIS — Z170 Estrogen receptor positive status [ER+]: Secondary | ICD-10-CM | POA: Diagnosis not present

## 2022-09-04 DIAGNOSIS — C50012 Malignant neoplasm of nipple and areola, left female breast: Secondary | ICD-10-CM | POA: Diagnosis not present

## 2022-09-09 DIAGNOSIS — I82402 Acute embolism and thrombosis of unspecified deep veins of left lower extremity: Secondary | ICD-10-CM | POA: Diagnosis not present

## 2022-09-09 DIAGNOSIS — C50419 Malignant neoplasm of upper-outer quadrant of unspecified female breast: Secondary | ICD-10-CM | POA: Diagnosis not present

## 2022-09-09 DIAGNOSIS — G9341 Metabolic encephalopathy: Secondary | ICD-10-CM | POA: Diagnosis not present

## 2022-09-09 DIAGNOSIS — Z7901 Long term (current) use of anticoagulants: Secondary | ICD-10-CM | POA: Diagnosis not present

## 2022-09-09 DIAGNOSIS — I129 Hypertensive chronic kidney disease with stage 1 through stage 4 chronic kidney disease, or unspecified chronic kidney disease: Secondary | ICD-10-CM | POA: Diagnosis not present

## 2022-09-09 DIAGNOSIS — K5792 Diverticulitis of intestine, part unspecified, without perforation or abscess without bleeding: Secondary | ICD-10-CM | POA: Diagnosis not present

## 2022-09-09 DIAGNOSIS — N1832 Chronic kidney disease, stage 3b: Secondary | ICD-10-CM | POA: Diagnosis not present

## 2022-09-09 DIAGNOSIS — Z9181 History of falling: Secondary | ICD-10-CM | POA: Diagnosis not present

## 2022-09-09 DIAGNOSIS — D62 Acute posthemorrhagic anemia: Secondary | ICD-10-CM | POA: Diagnosis not present

## 2022-09-09 DIAGNOSIS — I700 Atherosclerosis of aorta: Secondary | ICD-10-CM | POA: Diagnosis not present

## 2022-09-10 ENCOUNTER — Other Ambulatory Visit: Payer: Self-pay

## 2022-09-10 ENCOUNTER — Emergency Department (HOSPITAL_COMMUNITY): Payer: Medicare Other

## 2022-09-10 ENCOUNTER — Emergency Department (HOSPITAL_COMMUNITY)
Admission: EM | Admit: 2022-09-10 | Discharge: 2022-09-10 | Disposition: A | Discharge status: Home / Self Care | Payer: Medicare Other | Attending: Emergency Medicine | Admitting: Emergency Medicine

## 2022-09-10 DIAGNOSIS — M546 Pain in thoracic spine: Secondary | ICD-10-CM | POA: Diagnosis not present

## 2022-09-10 DIAGNOSIS — Z79899 Other long term (current) drug therapy: Secondary | ICD-10-CM | POA: Diagnosis not present

## 2022-09-10 DIAGNOSIS — I129 Hypertensive chronic kidney disease with stage 1 through stage 4 chronic kidney disease, or unspecified chronic kidney disease: Secondary | ICD-10-CM | POA: Insufficient documentation

## 2022-09-10 DIAGNOSIS — N189 Chronic kidney disease, unspecified: Secondary | ICD-10-CM | POA: Diagnosis not present

## 2022-09-10 DIAGNOSIS — M545 Low back pain, unspecified: Secondary | ICD-10-CM

## 2022-09-10 DIAGNOSIS — Z7901 Long term (current) use of anticoagulants: Secondary | ICD-10-CM | POA: Insufficient documentation

## 2022-09-10 DIAGNOSIS — N3000 Acute cystitis without hematuria: Secondary | ICD-10-CM | POA: Insufficient documentation

## 2022-09-10 DIAGNOSIS — M5459 Other low back pain: Secondary | ICD-10-CM | POA: Diagnosis not present

## 2022-09-10 LAB — URINALYSIS, ROUTINE W REFLEX MICROSCOPIC
Bilirubin Urine: NEGATIVE
Glucose, UA: NEGATIVE mg/dL
Ketones, ur: NEGATIVE mg/dL
Nitrite: NEGATIVE
Protein, ur: 100 mg/dL — AB
Specific Gravity, Urine: 1.01 (ref 1.005–1.030)
pH: 5 (ref 5.0–8.0)

## 2022-09-10 LAB — COMPREHENSIVE METABOLIC PANEL
ALT: 11 U/L (ref 0–44)
AST: 15 U/L (ref 15–41)
Albumin: 3.2 g/dL — ABNORMAL LOW (ref 3.5–5.0)
Alkaline Phosphatase: 74 U/L (ref 38–126)
Anion gap: 7 (ref 5–15)
BUN: 36 mg/dL — ABNORMAL HIGH (ref 8–23)
CO2: 33 mmol/L — ABNORMAL HIGH (ref 22–32)
Calcium: 10.8 mg/dL — ABNORMAL HIGH (ref 8.9–10.3)
Chloride: 99 mmol/L (ref 98–111)
Creatinine, Ser: 1.66 mg/dL — ABNORMAL HIGH (ref 0.44–1.00)
GFR, Estimated: 31 mL/min — ABNORMAL LOW (ref >60)
Glucose, Bld: 121 mg/dL — ABNORMAL HIGH (ref 70–99)
Potassium: 3.8 mmol/L (ref 3.5–5.1)
Sodium: 139 mmol/L (ref 135–145)
Total Bilirubin: 0.7 mg/dL (ref 0.3–1.2)
Total Protein: 7.4 g/dL (ref 6.5–8.1)

## 2022-09-10 LAB — CBC WITH DIFFERENTIAL/PLATELET
Abs Immature Granulocytes: 0.11 10*3/uL — ABNORMAL HIGH (ref 0.00–0.07)
Basophils Absolute: 0.1 10*3/uL (ref 0.0–0.1)
Basophils Relative: 1 %
Eosinophils Absolute: 0.2 10*3/uL (ref 0.0–0.5)
Eosinophils Relative: 1 %
HCT: 31.3 % — ABNORMAL LOW (ref 36.0–46.0)
Hemoglobin: 9.9 g/dL — ABNORMAL LOW (ref 12.0–15.0)
Immature Granulocytes: 1 %
Lymphocytes Relative: 14 %
Lymphs Abs: 1.5 10*3/uL (ref 0.7–4.0)
MCH: 31.8 pg (ref 26.0–34.0)
MCHC: 31.6 g/dL (ref 30.0–36.0)
MCV: 100.6 fL — ABNORMAL HIGH (ref 80.0–100.0)
Monocytes Absolute: 1.4 10*3/uL — ABNORMAL HIGH (ref 0.1–1.0)
Monocytes Relative: 12 %
Neutro Abs: 8.1 10*3/uL — ABNORMAL HIGH (ref 1.7–7.7)
Neutrophils Relative %: 71 %
Platelets: 296 10*3/uL (ref 150–400)
RBC: 3.11 MIL/uL — ABNORMAL LOW (ref 3.87–5.11)
RDW: 13.1 % (ref 11.5–15.5)
WBC: 11.3 10*3/uL — ABNORMAL HIGH (ref 4.0–10.5)
nRBC: 0 % (ref 0.0–0.2)

## 2022-09-10 MED ORDER — SODIUM CHLORIDE 0.9 % IV BOLUS: 250.0000 mL | Freq: Once | INTRAVENOUS | Status: DC

## 2022-09-10 MED ORDER — CEPHALEXIN 500 MG PO CAPS
500.0000 mg | ORAL_CAPSULE | Freq: Once | ORAL | Status: AC
  Administered 2022-09-10: 500 mg via ORAL
  Filled 2022-09-10: qty 1

## 2022-09-10 MED ORDER — CEPHALEXIN 500 MG PO CAPS: 500.0000 mg | ORAL_CAPSULE | Freq: Three times a day (TID) | ORAL | 0 refills | Status: DC

## 2022-09-10 NOTE — ED Provider Notes (Signed)
[name] DEPT Provider Note   CSN: [id number] Arrival date & time: 09/10/22  1625     History  Chief Complaint  Patient presents with   Back Pain    [name] [name] is a 81 y.o. female with past medical history significant for hypertension, acid reflux, obesity, CKD who presents with concern for low back pain, urinary incontinence last night.  Patient reports that she did not have incontinence secondary to lack of sensation, reports that she did not want to get up secondary to the back pain.  She reports improvement with tramadol.  She denies any numbness, tingling, weakness.  She denies any dysuria, hematuria, urinary frequency.   Back Pain      Home Medications Prior to Admission medications   Medication Sig Start Date End Date Taking? Authorizing Provider  cephALEXin (KEFLEX) 500 MG capsule Take 1 capsule (500 mg total) by mouth 3 (three) times daily. 09/10/22  Yes Graylyn Bunney H, PA-C  amLODipine (NORVASC) 10 MG tablet Take 10 mg by mouth daily. 02/26/22   [provider]  anastrozole (ARIMIDEX) 1 MG tablet Take 1 mg by mouth daily. 02/24/22   [provider]  apixaban (ELIQUIS) 5 MG TABS tablet Take 1 tablet (5 mg total) by mouth 2 (two) times daily. 06/16/22   [name], MD  Cholecalciferol 25 MCG (1000 UT) tablet Take 1,000 Units by mouth every other day.    [provider]  COMBIGAN 0.2-0.5 % ophthalmic solution Place 1 drop into both eyes every 12 (twelve) hours. 05/22/20   [provider]  furosemide (LASIX) 40 MG tablet Take 1 tablet (40 mg total) by mouth daily. 04/14/22   Orson Eva, MD  metoprolol tartrate (LOPRESSOR) 25 MG tablet Take 1 tablet (25 mg total) by mouth 2 (two) times daily. 06/05/22   Shelly Coss, MD  pantoprazole (PROTONIX) 40 MG tablet Take 40 mg by mouth daily. 05/29/22   [provider]  predniSONE (DELTASONE) 20 MG tablet Take 20 mg by mouth daily with breakfast.     [provider]  ROCKLATAN 0.02-0.005 % SOLN Apply 1 drop to eye at bedtime. 02/23/22   [provider]      Allergies    Ace inhibitors, Hydralazine, and Sulfa antibiotics    Review of Systems   Review of Systems  Musculoskeletal:  Positive for back pain.  All other systems reviewed and are negative.   Physical Exam Updated Vital Signs BP 121/63   Pulse 63   Temp 98.2 F (36.8 C)   Resp 18   Ht '5\' 4"'$  (1.626 m)   Wt 90 kg   SpO2 97%   BMI 34.06 kg/m  Physical Exam Vitals and nursing note reviewed.  Constitutional:      General: She is not in acute distress.    Appearance: Normal appearance.  HENT:     Head: Normocephalic and atraumatic.  Eyes:     General:        Right eye: No discharge.        Left eye: No discharge.  Cardiovascular:     Rate and Rhythm: Normal rate and regular rhythm.     Heart sounds: No murmur heard.    No friction rub. No gallop.  Pulmonary:     Effort: Pulmonary effort is normal.     Breath sounds: Normal breath sounds.  Abdominal:     General: Bowel sounds are normal.     Palpations: Abdomen is soft.  Comments: No tenderness palpation of the abdomen  Musculoskeletal:     Comments: Some midline lumbar spinal tenderness as well as lumbar paraspinous muscle tenderness.  Intact strength 5 out of 5 bilateral upper and lower extremities.  Skin:    General: Skin is warm and dry.     Capillary Refill: Capillary refill takes less than 2 seconds.  Neurological:     Mental Status: She is alert and oriented to person, place, and time.     Comments: Normal sensation throughout bilateral lower extremities and groin  Psychiatric:        Mood and Affect: Mood normal.        Behavior: Behavior normal.     ED Results / Procedures / Treatments   Labs (all labs ordered are listed, but only abnormal results are displayed) Labs Reviewed  CBC WITH DIFFERENTIAL/PLATELET - Abnormal; Notable for the following components:       Result Value   WBC 11.3 (*)    RBC 3.11 (*)    Hemoglobin 9.9 (*)    HCT 31.3 (*)    MCV 100.6 (*)    Neutro Abs 8.1 (*)    Monocytes Absolute 1.4 (*)    Abs Immature Granulocytes 0.11 (*)    All other components within normal limits  COMPREHENSIVE METABOLIC PANEL - Abnormal; Notable for the following components:   CO2 33 (*)    Glucose, Bld 121 (*)    BUN 36 (*)    Creatinine, Ser 1.66 (*)    Calcium 10.8 (*)    Albumin 3.2 (*)    GFR, Estimated 31 (*)    All other components within normal limits  URINALYSIS, ROUTINE W REFLEX MICROSCOPIC - Abnormal; Notable for the following components:   APPearance HAZY (*)    Hgb urine dipstick SMALL (*)    Protein, ur 100 (*)    Leukocytes,Ua SMALL (*)    Bacteria, UA RARE (*)    All other components within normal limits  URINE CULTURE    EKG None  Radiology CT Lumbar Spine Wo Contrast  Result Date: 09/10/2022 CLINICAL DATA:  Low back pain 1 month EXAM: CT LUMBAR SPINE WITHOUT CONTRAST TECHNIQUE: Multidetector CT imaging of the lumbar spine was performed without intravenous contrast administration. Multiplanar CT image reconstructions were also generated. RADIATION DOSE REDUCTION: This exam was performed according to the departmental dose-optimization program which includes automated exposure control, adjustment of the mA and/or kV according to patient size and/or use of iterative reconstruction technique. COMPARISON:  None Available. FINDINGS: Segmentation: 5 lumbar vertebra.  Lowest disc space L5-S1 Alignment: 5 mm anterolisthesis L4-5.  Remaining alignment normal. Vertebrae: Negative for fracture or mass. Paraspinal and other soft tissues: Atherosclerotic calcification aorta and iliac arteries without aneurysm. No paraspinous mass or adenopathy. Diffuse paraspinous muscle atrophy Disc levels: T11-12: Disc degeneration with disc space narrowing. Left-sided disc protrusion with mild spinal stenosis and moderate left subarticular and foraminal  stenosis. T12-L1: Advanced disc degeneration. Mild spurring on the right. No significant spinal stenosis. Mild right foraminal stenosis L1-2: Negative L2-3: Mild disc bulging.  Negative for stenosis L3-4: Disc bulging and facet degeneration. Borderline spinal stenosis. Mild subarticular stenosis bilaterally L4-5: Advanced facet degeneration. 5 mm anterolisthesis with diffuse disc bulging. Mild spinal stenosis. Moderate subarticular stenosis bilaterally L5-S1: Mild disc degeneration with shallow right-sided disc protrusion. Disc protrusion is touching the right S1 nerve root without nerve root compression. IMPRESSION: 1. Negative for lumbar fracture. 2. Multilevel disc and facet degeneration throughout the lumbar spine.  3. Mild spinal stenosis and moderate left subarticular and foraminal stenosis at T11-12. 4. Mild subarticular stenosis bilaterally L3-4. 5. Mild spinal stenosis and moderate subarticular stenosis bilaterally L4-5. 6. Shallow right-sided disc protrusion L5-S1 touching the right S1 nerve root without nerve root compression. 7. Aortic atherosclerosis. Aortic Atherosclerosis (ICD10-I70.0). Electronically Signed   By: Franchot Gallo M.D.   On: 09/10/2022 20:00    Procedures Procedures    Medications Ordered in ED Medications  cephALEXin (KEFLEX) capsule 500 mg (500 mg Oral Given 09/10/22 2110)    ED Course/ Medical Decision Making/ A&P                           Medical Decision Making  This patient is a 81 y.o. female who presents to the ED for concern of back pain, urinary frequency, this involves an extensive number of treatment options, and is a complaint that carries with it a high risk of complications and morbidity. The emergent differential diagnosis prior to evaluation includes, but is not limited to, UTI, acute on chronic back pain, new compression fracture, sciatica, less clinical suspicion for spinal cord injury, as patient with no neurologic deficits, she reports that she did  have a brief episode of urinary incontinence but that she had a full sensation the entire time, and was not moving secondary to back pain.   This is not an exhaustive differential.   Past Medical History / Co-morbidities / Social History: Hypertension, acid reflux, obesity, CKD  Additional history: Chart reviewed. Pertinent results include: Reviewed lab work, imaging from recent previous emergency room visits, admissions  Physical Exam: Physical exam performed. The pertinent findings include: some midline lumbar pain, but intact strength bil lower extremities, no significant ttp abdomen  Lab Tests: I ordered, and personally interpreted labs.  The pertinent results include:  UA with small leukocytes, red blood cells, some bacteria, may be compatible with early UTI, we will treat with Keflex.  CMP with creatinine that is fairly stable compared to patient's baseline, no other significant abnormalities other than elevated BUN, she does have minor hyperglycemia, glucose 121, hypercalcemia, calcium 10.8.  CBC notable for anemia similar to baseline, hemoglobin 9.9, mild leukocytosis, white blood cells 11.3.   Imaging Studies: I ordered imaging studies including CT lumbar spine without contrast. I independently visualized and interpreted imaging which showed multilevel disc and facet degeneration throughout the lumbar spine, some disc protrusion around L1, 5 mm anterolisthesis around L4-5 no evidence of central canal stenosis, compression fracture or other acute changes. I agree with the radiologist interpretation.  Medications: I ordered medication including keflex  for questionable early UTI. Patient may require recheck with PCP. No significant pain on my repeat eval, patient not requesting pain medication. Urine culture sent for follow up.   Disposition: After consideration of the diagnostic results and the patients response to treatment, I feel that patient without any acute lumbar spinal  pathology, evidence of questionable early UTI, no other significant abnormalities today, stable for discharge home.   emergency department workup does not suggest an emergent condition requiring admission or immediate intervention beyond what has been performed at this time. The plan is: as above. The patient is safe for discharge and has been instructed to return immediately for worsening symptoms, change in symptoms or any other concerns.  I discussed this case with my attending physician Dr. Ronnald Nian who cosigned this note including patient's presenting symptoms, physical exam, and planned diagnostics and interventions. Attending physician  stated agreement with plan or made changes to plan which were implemented.    Final Clinical Impression(s) / ED Diagnoses Final diagnoses:  Acute midline low back pain without sciatica  Acute cystitis without hematuria    Rx / DC Orders ED Discharge Orders          Ordered    cephALEXin (KEFLEX) 500 MG capsule  3 times daily        09/10/22 2058              Dorella Laster, Joesph Fillers, PA-C 09/10/22 2144    Lennice Sites, DO 09/10/22 2206

## 2022-09-10 NOTE — ED Provider Triage Note (Signed)
Emergency Medicine Provider Triage Evaluation Note  [name] [name] , a 80 y.o. female  was evaluated in triage.  Pt complains of right-sided low back pain and urinary incontinence at night.  Patient denies dysuria.  Family at bedside states that patient is a poor historian.  Patient was reportedly admitted to the hospital due to a urinary tract infection in August of this year.  In October of this year the patient was seen by primary care for low back pain and a UA showed evidence of bacteria.  She was prescribed antibiotic and tramadol for the pain.  Her symptoms resolved.  Over the past 1 to 2 days she has developed worsening back pain and over the past few days has had episodes of incontinence at night.  She denies urinary retention, injury, fecal incontinence.  Review of Systems  Positive: As above Negative: As above  Physical Exam  BP (!) 190/73   Pulse 67   Temp 98.2 F (36.8 C)   Resp 20   Ht '5\' 4"'$  (1.626 m)   Wt 90 kg   SpO2 94%   BMI 34.06 kg/m  Gen:   Awake, no distress   Resp:  Normal effort  MSK:   Moves extremities without difficulty  Other:    Medical Decision Making  Medically screening exam initiated at 4:46 PM.  Appropriate orders placed.  [name] [name] was informed that the remainder of the evaluation will be completed by another provider, this initial triage assessment does not replace that evaluation, and the importance of remaining in the ED until their evaluation is complete.     Dorothyann Peng, PA-C 09/10/22 1648

## 2022-09-10 NOTE — ED Triage Notes (Addendum)
Lower Back pain 1 month ago and treated with tramadol and abt with improvement. Pain returned yesterday with urinary incontinence at night.

## 2022-09-10 NOTE — Discharge Instructions (Addendum)
For you back pain please use Tylenol or ibuprofen for pain.  You may use 600 mg ibuprofen every 6 hours or 1000 mg of Tylenol every 6 hours.  You may choose to alternate between the 2.  This would be most effective.  Not to exceed 4 g of Tylenol within 24 hours.  Not to exceed 3200 mg ibuprofen 24 hours.

## 2022-09-12 LAB — URINE CULTURE: Culture: >=100000 — AB

## 2022-09-13 ENCOUNTER — Telehealth (HOSPITAL_BASED_OUTPATIENT_CLINIC_OR_DEPARTMENT_OTHER): Payer: Self-pay | Admitting: *Deleted

## 2022-09-13 NOTE — Telephone Encounter (Signed)
Post ED Visit - Positive Culture Follow-up  Culture report reviewed by antimicrobial stewardship pharmacist: [facility] '[]'$  Elenor Quinones, Pharm.D. '[]'$  Heide Guile, Pharm.D., BCPS AQ-ID '[]'$  Parks Neptune, Pharm.D., BCPS '[]'$  Alycia Rossetti, Pharm.D., BCPS '[]'$  [place], Pharm.D., BCPS, AAHIVP '[]'$  Legrand Como, Pharm.D., BCPS, AAHIVP '[]'$  Salome Arnt, PharmD, BCPS '[]'$  Johnnette Gourd, PharmD, BCPS '[]'$  Hughes Better, PharmD, BCPS '[]'$  Leeroy Cha, PharmD '[]'$  Laqueta Linden, PharmD, BCPS '[]'$  Albertina Parr, PharmD  [facility] '[]'$  Leodis Sias, PharmD '[]'$  Lindell Spar, PharmD '[x]'$  Royetta Asal, PharmD '[]'$  Graylin Shiver, Rph '[]'$  Rema Fendt) Glennon Mac, PharmD '[]'$  Arlyn Dunning, PharmD '[]'$  Netta Cedars, PharmD '[]'$  Dia Sitter, PharmD '[]'$  Leone Haven, PharmD '[]'$  Gretta Arab, PharmD '[]'$  Theodis Shove, PharmD '[]'$  Peggyann Juba, PharmD '[]'$  Reuel Boom, PharmD   Positive urine culture Treated with Cephalexin, organism sensitive to the same and no further patient follow-up is required at this time.  [name] [name] 09/13/2022, 1:09 PM

## 2022-09-16 DIAGNOSIS — Z299 Encounter for prophylactic measures, unspecified: Secondary | ICD-10-CM | POA: Diagnosis not present

## 2022-09-16 DIAGNOSIS — I700 Atherosclerosis of aorta: Secondary | ICD-10-CM | POA: Diagnosis not present

## 2022-09-16 DIAGNOSIS — Z7901 Long term (current) use of anticoagulants: Secondary | ICD-10-CM | POA: Diagnosis not present

## 2022-09-16 DIAGNOSIS — N1832 Chronic kidney disease, stage 3b: Secondary | ICD-10-CM | POA: Diagnosis not present

## 2022-09-16 DIAGNOSIS — I129 Hypertensive chronic kidney disease with stage 1 through stage 4 chronic kidney disease, or unspecified chronic kidney disease: Secondary | ICD-10-CM | POA: Diagnosis not present

## 2022-09-16 DIAGNOSIS — C50419 Malignant neoplasm of upper-outer quadrant of unspecified female breast: Secondary | ICD-10-CM | POA: Diagnosis not present

## 2022-09-16 DIAGNOSIS — I82409 Acute embolism and thrombosis of unspecified deep veins of unspecified lower extremity: Secondary | ICD-10-CM | POA: Diagnosis not present

## 2022-09-16 DIAGNOSIS — N390 Urinary tract infection, site not specified: Secondary | ICD-10-CM | POA: Diagnosis not present

## 2022-09-16 DIAGNOSIS — K5792 Diverticulitis of intestine, part unspecified, without perforation or abscess without bleeding: Secondary | ICD-10-CM | POA: Diagnosis not present

## 2022-09-16 DIAGNOSIS — I82402 Acute embolism and thrombosis of unspecified deep veins of left lower extremity: Secondary | ICD-10-CM | POA: Diagnosis not present

## 2022-09-16 DIAGNOSIS — Z9181 History of falling: Secondary | ICD-10-CM | POA: Diagnosis not present

## 2022-09-16 DIAGNOSIS — D62 Acute posthemorrhagic anemia: Secondary | ICD-10-CM | POA: Diagnosis not present

## 2022-09-16 DIAGNOSIS — I10 Essential (primary) hypertension: Secondary | ICD-10-CM | POA: Diagnosis not present

## 2022-09-16 DIAGNOSIS — M549 Dorsalgia, unspecified: Secondary | ICD-10-CM | POA: Diagnosis not present

## 2022-09-16 DIAGNOSIS — Z23 Encounter for immunization: Secondary | ICD-10-CM | POA: Diagnosis not present

## 2022-09-16 DIAGNOSIS — G9341 Metabolic encephalopathy: Secondary | ICD-10-CM | POA: Diagnosis not present

## 2022-09-21 DIAGNOSIS — N1832 Chronic kidney disease, stage 3b: Secondary | ICD-10-CM | POA: Diagnosis not present

## 2022-09-21 DIAGNOSIS — D62 Acute posthemorrhagic anemia: Secondary | ICD-10-CM | POA: Diagnosis not present

## 2022-09-21 DIAGNOSIS — Z7901 Long term (current) use of anticoagulants: Secondary | ICD-10-CM | POA: Diagnosis not present

## 2022-09-21 DIAGNOSIS — I82402 Acute embolism and thrombosis of unspecified deep veins of left lower extremity: Secondary | ICD-10-CM | POA: Diagnosis not present

## 2022-09-21 DIAGNOSIS — I700 Atherosclerosis of aorta: Secondary | ICD-10-CM | POA: Diagnosis not present

## 2022-09-21 DIAGNOSIS — Z9181 History of falling: Secondary | ICD-10-CM | POA: Diagnosis not present

## 2022-09-21 DIAGNOSIS — I129 Hypertensive chronic kidney disease with stage 1 through stage 4 chronic kidney disease, or unspecified chronic kidney disease: Secondary | ICD-10-CM | POA: Diagnosis not present

## 2022-09-21 DIAGNOSIS — C50419 Malignant neoplasm of upper-outer quadrant of unspecified female breast: Secondary | ICD-10-CM | POA: Diagnosis not present

## 2022-09-21 DIAGNOSIS — K5792 Diverticulitis of intestine, part unspecified, without perforation or abscess without bleeding: Secondary | ICD-10-CM | POA: Diagnosis not present

## 2022-09-21 DIAGNOSIS — G9341 Metabolic encephalopathy: Secondary | ICD-10-CM | POA: Diagnosis not present

## 2022-09-28 DIAGNOSIS — I82402 Acute embolism and thrombosis of unspecified deep veins of left lower extremity: Secondary | ICD-10-CM | POA: Diagnosis not present

## 2022-09-28 DIAGNOSIS — Z9181 History of falling: Secondary | ICD-10-CM | POA: Diagnosis not present

## 2022-09-28 DIAGNOSIS — N1832 Chronic kidney disease, stage 3b: Secondary | ICD-10-CM | POA: Diagnosis not present

## 2022-09-28 DIAGNOSIS — I700 Atherosclerosis of aorta: Secondary | ICD-10-CM | POA: Diagnosis not present

## 2022-09-28 DIAGNOSIS — D62 Acute posthemorrhagic anemia: Secondary | ICD-10-CM | POA: Diagnosis not present

## 2022-09-28 DIAGNOSIS — K5792 Diverticulitis of intestine, part unspecified, without perforation or abscess without bleeding: Secondary | ICD-10-CM | POA: Diagnosis not present

## 2022-09-28 DIAGNOSIS — I129 Hypertensive chronic kidney disease with stage 1 through stage 4 chronic kidney disease, or unspecified chronic kidney disease: Secondary | ICD-10-CM | POA: Diagnosis not present

## 2022-09-28 DIAGNOSIS — C50419 Malignant neoplasm of upper-outer quadrant of unspecified female breast: Secondary | ICD-10-CM | POA: Diagnosis not present

## 2022-09-28 DIAGNOSIS — Z7901 Long term (current) use of anticoagulants: Secondary | ICD-10-CM | POA: Diagnosis not present

## 2022-09-28 DIAGNOSIS — G9341 Metabolic encephalopathy: Secondary | ICD-10-CM | POA: Diagnosis not present

## 2022-09-30 DIAGNOSIS — C50912 Malignant neoplasm of unspecified site of left female breast: Secondary | ICD-10-CM | POA: Diagnosis not present

## 2022-09-30 DIAGNOSIS — H5213 Myopia, bilateral: Secondary | ICD-10-CM | POA: Diagnosis not present

## 2022-10-01 DIAGNOSIS — N390 Urinary tract infection, site not specified: Secondary | ICD-10-CM | POA: Diagnosis not present

## 2022-10-01 DIAGNOSIS — N179 Acute kidney failure, unspecified: Secondary | ICD-10-CM | POA: Diagnosis not present

## 2022-10-05 DIAGNOSIS — N1832 Chronic kidney disease, stage 3b: Secondary | ICD-10-CM | POA: Diagnosis not present

## 2022-10-05 DIAGNOSIS — Z9181 History of falling: Secondary | ICD-10-CM | POA: Diagnosis not present

## 2022-10-05 DIAGNOSIS — I82402 Acute embolism and thrombosis of unspecified deep veins of left lower extremity: Secondary | ICD-10-CM | POA: Diagnosis not present

## 2022-10-05 DIAGNOSIS — C50419 Malignant neoplasm of upper-outer quadrant of unspecified female breast: Secondary | ICD-10-CM | POA: Diagnosis not present

## 2022-10-05 DIAGNOSIS — Z7901 Long term (current) use of anticoagulants: Secondary | ICD-10-CM | POA: Diagnosis not present

## 2022-10-05 DIAGNOSIS — D62 Acute posthemorrhagic anemia: Secondary | ICD-10-CM | POA: Diagnosis not present

## 2022-10-05 DIAGNOSIS — I129 Hypertensive chronic kidney disease with stage 1 through stage 4 chronic kidney disease, or unspecified chronic kidney disease: Secondary | ICD-10-CM | POA: Diagnosis not present

## 2022-10-05 DIAGNOSIS — I700 Atherosclerosis of aorta: Secondary | ICD-10-CM | POA: Diagnosis not present

## 2022-10-05 DIAGNOSIS — K5792 Diverticulitis of intestine, part unspecified, without perforation or abscess without bleeding: Secondary | ICD-10-CM | POA: Diagnosis not present

## 2022-10-05 DIAGNOSIS — G9341 Metabolic encephalopathy: Secondary | ICD-10-CM | POA: Diagnosis not present

## 2022-10-09 DIAGNOSIS — N057 Unspecified nephritic syndrome with diffuse crescentic glomerulonephritis: Secondary | ICD-10-CM | POA: Diagnosis not present

## 2022-10-09 DIAGNOSIS — D849 Immunodeficiency, unspecified: Secondary | ICD-10-CM | POA: Diagnosis not present

## 2022-10-09 DIAGNOSIS — Z79899 Other long term (current) drug therapy: Secondary | ICD-10-CM | POA: Diagnosis not present

## 2022-10-09 DIAGNOSIS — N1832 Chronic kidney disease, stage 3b: Secondary | ICD-10-CM | POA: Diagnosis not present

## 2022-10-09 DIAGNOSIS — N390 Urinary tract infection, site not specified: Secondary | ICD-10-CM | POA: Diagnosis not present

## 2022-10-09 DIAGNOSIS — N058 Unspecified nephritic syndrome with other morphologic changes: Secondary | ICD-10-CM | POA: Diagnosis not present

## 2022-10-09 DIAGNOSIS — I129 Hypertensive chronic kidney disease with stage 1 through stage 4 chronic kidney disease, or unspecified chronic kidney disease: Secondary | ICD-10-CM | POA: Diagnosis not present

## 2022-10-09 DIAGNOSIS — D631 Anemia in chronic kidney disease: Secondary | ICD-10-CM | POA: Diagnosis not present

## 2022-10-12 DIAGNOSIS — N1832 Chronic kidney disease, stage 3b: Secondary | ICD-10-CM | POA: Diagnosis not present

## 2022-10-12 DIAGNOSIS — K5792 Diverticulitis of intestine, part unspecified, without perforation or abscess without bleeding: Secondary | ICD-10-CM | POA: Diagnosis not present

## 2022-10-12 DIAGNOSIS — I700 Atherosclerosis of aorta: Secondary | ICD-10-CM | POA: Diagnosis not present

## 2022-10-12 DIAGNOSIS — G9341 Metabolic encephalopathy: Secondary | ICD-10-CM | POA: Diagnosis not present

## 2022-10-12 DIAGNOSIS — D62 Acute posthemorrhagic anemia: Secondary | ICD-10-CM | POA: Diagnosis not present

## 2022-10-12 DIAGNOSIS — Z91148 Patient's other noncompliance with medication regimen for other reason: Secondary | ICD-10-CM | POA: Diagnosis not present

## 2022-10-12 DIAGNOSIS — Z7901 Long term (current) use of anticoagulants: Secondary | ICD-10-CM | POA: Diagnosis not present

## 2022-10-12 DIAGNOSIS — I129 Hypertensive chronic kidney disease with stage 1 through stage 4 chronic kidney disease, or unspecified chronic kidney disease: Secondary | ICD-10-CM | POA: Diagnosis not present

## 2022-10-12 DIAGNOSIS — C50419 Malignant neoplasm of upper-outer quadrant of unspecified female breast: Secondary | ICD-10-CM | POA: Diagnosis not present

## 2022-10-12 DIAGNOSIS — Z9181 History of falling: Secondary | ICD-10-CM | POA: Diagnosis not present

## 2022-10-12 DIAGNOSIS — Z86718 Personal history of other venous thrombosis and embolism: Secondary | ICD-10-CM | POA: Diagnosis not present

## 2022-10-23 DIAGNOSIS — Z7901 Long term (current) use of anticoagulants: Secondary | ICD-10-CM | POA: Diagnosis not present

## 2022-10-23 DIAGNOSIS — C50419 Malignant neoplasm of upper-outer quadrant of unspecified female breast: Secondary | ICD-10-CM | POA: Diagnosis not present

## 2022-10-23 DIAGNOSIS — K5792 Diverticulitis of intestine, part unspecified, without perforation or abscess without bleeding: Secondary | ICD-10-CM | POA: Diagnosis not present

## 2022-10-23 DIAGNOSIS — Z86718 Personal history of other venous thrombosis and embolism: Secondary | ICD-10-CM | POA: Diagnosis not present

## 2022-10-23 DIAGNOSIS — I700 Atherosclerosis of aorta: Secondary | ICD-10-CM | POA: Diagnosis not present

## 2022-10-23 DIAGNOSIS — I129 Hypertensive chronic kidney disease with stage 1 through stage 4 chronic kidney disease, or unspecified chronic kidney disease: Secondary | ICD-10-CM | POA: Diagnosis not present

## 2022-10-23 DIAGNOSIS — D62 Acute posthemorrhagic anemia: Secondary | ICD-10-CM | POA: Diagnosis not present

## 2022-10-23 DIAGNOSIS — G9341 Metabolic encephalopathy: Secondary | ICD-10-CM | POA: Diagnosis not present

## 2022-10-23 DIAGNOSIS — Z9181 History of falling: Secondary | ICD-10-CM | POA: Diagnosis not present

## 2022-10-23 DIAGNOSIS — Z91148 Patient's other noncompliance with medication regimen for other reason: Secondary | ICD-10-CM | POA: Diagnosis not present

## 2022-10-23 DIAGNOSIS — N1832 Chronic kidney disease, stage 3b: Secondary | ICD-10-CM | POA: Diagnosis not present

## 2022-10-27 DIAGNOSIS — Z86718 Personal history of other venous thrombosis and embolism: Secondary | ICD-10-CM | POA: Diagnosis not present

## 2022-10-27 DIAGNOSIS — C50419 Malignant neoplasm of upper-outer quadrant of unspecified female breast: Secondary | ICD-10-CM | POA: Diagnosis not present

## 2022-10-27 DIAGNOSIS — I129 Hypertensive chronic kidney disease with stage 1 through stage 4 chronic kidney disease, or unspecified chronic kidney disease: Secondary | ICD-10-CM | POA: Diagnosis not present

## 2022-10-27 DIAGNOSIS — D62 Acute posthemorrhagic anemia: Secondary | ICD-10-CM | POA: Diagnosis not present

## 2022-10-27 DIAGNOSIS — K5792 Diverticulitis of intestine, part unspecified, without perforation or abscess without bleeding: Secondary | ICD-10-CM | POA: Diagnosis not present

## 2022-10-27 DIAGNOSIS — Z7901 Long term (current) use of anticoagulants: Secondary | ICD-10-CM | POA: Diagnosis not present

## 2022-10-27 DIAGNOSIS — Z91148 Patient's other noncompliance with medication regimen for other reason: Secondary | ICD-10-CM | POA: Diagnosis not present

## 2022-10-27 DIAGNOSIS — G9341 Metabolic encephalopathy: Secondary | ICD-10-CM | POA: Diagnosis not present

## 2022-10-27 DIAGNOSIS — I700 Atherosclerosis of aorta: Secondary | ICD-10-CM | POA: Diagnosis not present

## 2022-10-27 DIAGNOSIS — N1832 Chronic kidney disease, stage 3b: Secondary | ICD-10-CM | POA: Diagnosis not present

## 2022-10-27 DIAGNOSIS — Z9181 History of falling: Secondary | ICD-10-CM | POA: Diagnosis not present

## 2022-10-29 DIAGNOSIS — I10 Essential (primary) hypertension: Secondary | ICD-10-CM | POA: Diagnosis not present

## 2022-10-29 DIAGNOSIS — N047 Nephrotic syndrome with diffuse crescentic glomerulonephritis: Secondary | ICD-10-CM | POA: Diagnosis not present

## 2022-10-29 DIAGNOSIS — Z299 Encounter for prophylactic measures, unspecified: Secondary | ICD-10-CM | POA: Diagnosis not present

## 2022-10-29 DIAGNOSIS — M79672 Pain in left foot: Secondary | ICD-10-CM | POA: Diagnosis not present

## 2022-10-29 DIAGNOSIS — R062 Wheezing: Secondary | ICD-10-CM | POA: Diagnosis not present

## 2022-10-29 DIAGNOSIS — M19072 Primary osteoarthritis, left ankle and foot: Secondary | ICD-10-CM | POA: Diagnosis not present

## 2022-10-29 DIAGNOSIS — M2042 Other hammer toe(s) (acquired), left foot: Secondary | ICD-10-CM | POA: Diagnosis not present

## 2022-10-29 DIAGNOSIS — M2012 Hallux valgus (acquired), left foot: Secondary | ICD-10-CM | POA: Diagnosis not present

## 2022-11-04 DIAGNOSIS — I700 Atherosclerosis of aorta: Secondary | ICD-10-CM | POA: Diagnosis not present

## 2022-11-04 DIAGNOSIS — Z9181 History of falling: Secondary | ICD-10-CM | POA: Diagnosis not present

## 2022-11-04 DIAGNOSIS — G9341 Metabolic encephalopathy: Secondary | ICD-10-CM | POA: Diagnosis not present

## 2022-11-04 DIAGNOSIS — Z91148 Patient's other noncompliance with medication regimen for other reason: Secondary | ICD-10-CM | POA: Diagnosis not present

## 2022-11-04 DIAGNOSIS — D62 Acute posthemorrhagic anemia: Secondary | ICD-10-CM | POA: Diagnosis not present

## 2022-11-04 DIAGNOSIS — N1832 Chronic kidney disease, stage 3b: Secondary | ICD-10-CM | POA: Diagnosis not present

## 2022-11-04 DIAGNOSIS — C50419 Malignant neoplasm of upper-outer quadrant of unspecified female breast: Secondary | ICD-10-CM | POA: Diagnosis not present

## 2022-11-04 DIAGNOSIS — I129 Hypertensive chronic kidney disease with stage 1 through stage 4 chronic kidney disease, or unspecified chronic kidney disease: Secondary | ICD-10-CM | POA: Diagnosis not present

## 2022-11-04 DIAGNOSIS — Z86718 Personal history of other venous thrombosis and embolism: Secondary | ICD-10-CM | POA: Diagnosis not present

## 2022-11-04 DIAGNOSIS — K5792 Diverticulitis of intestine, part unspecified, without perforation or abscess without bleeding: Secondary | ICD-10-CM | POA: Diagnosis not present

## 2022-11-04 DIAGNOSIS — Z7901 Long term (current) use of anticoagulants: Secondary | ICD-10-CM | POA: Diagnosis not present

## 2022-11-10 DIAGNOSIS — Z7901 Long term (current) use of anticoagulants: Secondary | ICD-10-CM | POA: Diagnosis not present

## 2022-11-10 DIAGNOSIS — Z86718 Personal history of other venous thrombosis and embolism: Secondary | ICD-10-CM | POA: Diagnosis not present

## 2022-11-10 DIAGNOSIS — I129 Hypertensive chronic kidney disease with stage 1 through stage 4 chronic kidney disease, or unspecified chronic kidney disease: Secondary | ICD-10-CM | POA: Diagnosis not present

## 2022-11-10 DIAGNOSIS — D62 Acute posthemorrhagic anemia: Secondary | ICD-10-CM | POA: Diagnosis not present

## 2022-11-10 DIAGNOSIS — K5792 Diverticulitis of intestine, part unspecified, without perforation or abscess without bleeding: Secondary | ICD-10-CM | POA: Diagnosis not present

## 2022-11-10 DIAGNOSIS — C50419 Malignant neoplasm of upper-outer quadrant of unspecified female breast: Secondary | ICD-10-CM | POA: Diagnosis not present

## 2022-11-10 DIAGNOSIS — Z91148 Patient's other noncompliance with medication regimen for other reason: Secondary | ICD-10-CM | POA: Diagnosis not present

## 2022-11-10 DIAGNOSIS — G9341 Metabolic encephalopathy: Secondary | ICD-10-CM | POA: Diagnosis not present

## 2022-11-10 DIAGNOSIS — I700 Atherosclerosis of aorta: Secondary | ICD-10-CM | POA: Diagnosis not present

## 2022-11-10 DIAGNOSIS — Z9181 History of falling: Secondary | ICD-10-CM | POA: Diagnosis not present

## 2022-11-10 DIAGNOSIS — N1832 Chronic kidney disease, stage 3b: Secondary | ICD-10-CM | POA: Diagnosis not present

## 2022-11-18 DIAGNOSIS — K5792 Diverticulitis of intestine, part unspecified, without perforation or abscess without bleeding: Secondary | ICD-10-CM | POA: Diagnosis not present

## 2022-11-18 DIAGNOSIS — I129 Hypertensive chronic kidney disease with stage 1 through stage 4 chronic kidney disease, or unspecified chronic kidney disease: Secondary | ICD-10-CM | POA: Diagnosis not present

## 2022-11-18 DIAGNOSIS — D62 Acute posthemorrhagic anemia: Secondary | ICD-10-CM | POA: Diagnosis not present

## 2022-11-18 DIAGNOSIS — G9341 Metabolic encephalopathy: Secondary | ICD-10-CM | POA: Diagnosis not present

## 2022-11-19 DIAGNOSIS — H01001 Unspecified blepharitis right upper eyelid: Secondary | ICD-10-CM | POA: Diagnosis not present

## 2022-11-19 DIAGNOSIS — H401431 Capsular glaucoma with pseudoexfoliation of lens, bilateral, mild stage: Secondary | ICD-10-CM | POA: Diagnosis not present

## 2022-11-19 DIAGNOSIS — H18413 Arcus senilis, bilateral: Secondary | ICD-10-CM | POA: Diagnosis not present

## 2022-11-19 DIAGNOSIS — H40053 Ocular hypertension, bilateral: Secondary | ICD-10-CM | POA: Diagnosis not present

## 2022-11-20 DIAGNOSIS — K5792 Diverticulitis of intestine, part unspecified, without perforation or abscess without bleeding: Secondary | ICD-10-CM | POA: Diagnosis not present

## 2022-11-20 DIAGNOSIS — C50419 Malignant neoplasm of upper-outer quadrant of unspecified female breast: Secondary | ICD-10-CM | POA: Diagnosis not present

## 2022-11-20 DIAGNOSIS — I700 Atherosclerosis of aorta: Secondary | ICD-10-CM | POA: Diagnosis not present

## 2022-11-20 DIAGNOSIS — Z91148 Patient's other noncompliance with medication regimen for other reason: Secondary | ICD-10-CM | POA: Diagnosis not present

## 2022-11-20 DIAGNOSIS — G9341 Metabolic encephalopathy: Secondary | ICD-10-CM | POA: Diagnosis not present

## 2022-11-20 DIAGNOSIS — D62 Acute posthemorrhagic anemia: Secondary | ICD-10-CM | POA: Diagnosis not present

## 2022-11-20 DIAGNOSIS — Z86718 Personal history of other venous thrombosis and embolism: Secondary | ICD-10-CM | POA: Diagnosis not present

## 2022-11-20 DIAGNOSIS — I129 Hypertensive chronic kidney disease with stage 1 through stage 4 chronic kidney disease, or unspecified chronic kidney disease: Secondary | ICD-10-CM | POA: Diagnosis not present

## 2022-11-20 DIAGNOSIS — N1832 Chronic kidney disease, stage 3b: Secondary | ICD-10-CM | POA: Diagnosis not present

## 2022-11-20 DIAGNOSIS — N390 Urinary tract infection, site not specified: Secondary | ICD-10-CM | POA: Diagnosis not present

## 2022-11-20 DIAGNOSIS — Z7901 Long term (current) use of anticoagulants: Secondary | ICD-10-CM | POA: Diagnosis not present

## 2022-11-20 DIAGNOSIS — Z9181 History of falling: Secondary | ICD-10-CM | POA: Diagnosis not present

## 2022-11-24 DIAGNOSIS — D631 Anemia in chronic kidney disease: Secondary | ICD-10-CM | POA: Diagnosis not present

## 2022-11-24 DIAGNOSIS — N057 Unspecified nephritic syndrome with diffuse crescentic glomerulonephritis: Secondary | ICD-10-CM | POA: Diagnosis not present

## 2022-11-24 DIAGNOSIS — N1832 Chronic kidney disease, stage 3b: Secondary | ICD-10-CM | POA: Diagnosis not present

## 2022-11-24 DIAGNOSIS — N058 Unspecified nephritic syndrome with other morphologic changes: Secondary | ICD-10-CM | POA: Diagnosis not present

## 2022-11-24 DIAGNOSIS — I129 Hypertensive chronic kidney disease with stage 1 through stage 4 chronic kidney disease, or unspecified chronic kidney disease: Secondary | ICD-10-CM | POA: Diagnosis not present

## 2022-11-24 DIAGNOSIS — I5032 Chronic diastolic (congestive) heart failure: Secondary | ICD-10-CM | POA: Diagnosis not present

## 2022-11-25 DIAGNOSIS — D62 Acute posthemorrhagic anemia: Secondary | ICD-10-CM | POA: Diagnosis not present

## 2022-11-25 DIAGNOSIS — C50419 Malignant neoplasm of upper-outer quadrant of unspecified female breast: Secondary | ICD-10-CM | POA: Diagnosis not present

## 2022-11-25 DIAGNOSIS — Z7901 Long term (current) use of anticoagulants: Secondary | ICD-10-CM | POA: Diagnosis not present

## 2022-11-25 DIAGNOSIS — Z91148 Patient's other noncompliance with medication regimen for other reason: Secondary | ICD-10-CM | POA: Diagnosis not present

## 2022-11-25 DIAGNOSIS — I129 Hypertensive chronic kidney disease with stage 1 through stage 4 chronic kidney disease, or unspecified chronic kidney disease: Secondary | ICD-10-CM | POA: Diagnosis not present

## 2022-11-25 DIAGNOSIS — Z86718 Personal history of other venous thrombosis and embolism: Secondary | ICD-10-CM | POA: Diagnosis not present

## 2022-11-25 DIAGNOSIS — G9341 Metabolic encephalopathy: Secondary | ICD-10-CM | POA: Diagnosis not present

## 2022-11-25 DIAGNOSIS — K5792 Diverticulitis of intestine, part unspecified, without perforation or abscess without bleeding: Secondary | ICD-10-CM | POA: Diagnosis not present

## 2022-11-25 DIAGNOSIS — N1832 Chronic kidney disease, stage 3b: Secondary | ICD-10-CM | POA: Diagnosis not present

## 2022-11-25 DIAGNOSIS — Z9181 History of falling: Secondary | ICD-10-CM | POA: Diagnosis not present

## 2022-11-25 DIAGNOSIS — I700 Atherosclerosis of aorta: Secondary | ICD-10-CM | POA: Diagnosis not present

## 2022-11-30 DIAGNOSIS — Z170 Estrogen receptor positive status [ER+]: Secondary | ICD-10-CM | POA: Diagnosis not present

## 2022-11-30 DIAGNOSIS — C50012 Malignant neoplasm of nipple and areola, left female breast: Secondary | ICD-10-CM | POA: Diagnosis not present

## 2022-11-30 DIAGNOSIS — D508 Other iron deficiency anemias: Secondary | ICD-10-CM | POA: Diagnosis not present

## 2022-11-30 DIAGNOSIS — E559 Vitamin D deficiency, unspecified: Secondary | ICD-10-CM | POA: Diagnosis not present

## 2022-12-04 DIAGNOSIS — Z170 Estrogen receptor positive status [ER+]: Secondary | ICD-10-CM | POA: Diagnosis not present

## 2022-12-04 DIAGNOSIS — Z09 Encounter for follow-up examination after completed treatment for conditions other than malignant neoplasm: Secondary | ICD-10-CM | POA: Diagnosis not present

## 2022-12-04 DIAGNOSIS — C50012 Malignant neoplasm of nipple and areola, left female breast: Secondary | ICD-10-CM | POA: Diagnosis not present

## 2022-12-04 DIAGNOSIS — M898X9 Other specified disorders of bone, unspecified site: Secondary | ICD-10-CM | POA: Diagnosis not present

## 2022-12-04 DIAGNOSIS — T451X5A Adverse effect of antineoplastic and immunosuppressive drugs, initial encounter: Secondary | ICD-10-CM | POA: Diagnosis not present

## 2022-12-04 DIAGNOSIS — M8580 Other specified disorders of bone density and structure, unspecified site: Secondary | ICD-10-CM | POA: Diagnosis not present

## 2022-12-17 DIAGNOSIS — H18413 Arcus senilis, bilateral: Secondary | ICD-10-CM | POA: Diagnosis not present

## 2022-12-17 DIAGNOSIS — H40053 Ocular hypertension, bilateral: Secondary | ICD-10-CM | POA: Diagnosis not present

## 2022-12-17 DIAGNOSIS — H01001 Unspecified blepharitis right upper eyelid: Secondary | ICD-10-CM | POA: Diagnosis not present

## 2022-12-17 DIAGNOSIS — H401431 Capsular glaucoma with pseudoexfoliation of lens, bilateral, mild stage: Secondary | ICD-10-CM | POA: Diagnosis not present

## 2022-12-25 ENCOUNTER — Encounter: Payer: Self-pay | Admitting: Registered Nurse

## 2023-01-06 ENCOUNTER — Other Ambulatory Visit (HOSPITAL_COMMUNITY): Payer: Self-pay | Admitting: Nephrology

## 2023-01-06 DIAGNOSIS — I10 Essential (primary) hypertension: Secondary | ICD-10-CM | POA: Diagnosis not present

## 2023-01-06 DIAGNOSIS — Z299 Encounter for prophylactic measures, unspecified: Secondary | ICD-10-CM | POA: Diagnosis not present

## 2023-01-06 DIAGNOSIS — N2581 Secondary hyperparathyroidism of renal origin: Secondary | ICD-10-CM

## 2023-01-06 DIAGNOSIS — M19049 Primary osteoarthritis, unspecified hand: Secondary | ICD-10-CM | POA: Diagnosis not present

## 2023-01-26 DIAGNOSIS — N047 Nephrotic syndrome with diffuse crescentic glomerulonephritis: Secondary | ICD-10-CM | POA: Diagnosis not present

## 2023-01-26 DIAGNOSIS — M19032 Primary osteoarthritis, left wrist: Secondary | ICD-10-CM | POA: Diagnosis not present

## 2023-01-26 DIAGNOSIS — Z299 Encounter for prophylactic measures, unspecified: Secondary | ICD-10-CM | POA: Diagnosis not present

## 2023-01-26 DIAGNOSIS — M7989 Other specified soft tissue disorders: Secondary | ICD-10-CM | POA: Diagnosis not present

## 2023-01-26 DIAGNOSIS — R52 Pain, unspecified: Secondary | ICD-10-CM | POA: Diagnosis not present

## 2023-01-26 DIAGNOSIS — I10 Essential (primary) hypertension: Secondary | ICD-10-CM | POA: Diagnosis not present

## 2023-01-28 DIAGNOSIS — M8580 Other specified disorders of bone density and structure, unspecified site: Secondary | ICD-10-CM | POA: Diagnosis not present

## 2023-01-28 DIAGNOSIS — Z170 Estrogen receptor positive status [ER+]: Secondary | ICD-10-CM | POA: Diagnosis not present

## 2023-01-28 DIAGNOSIS — M8589 Other specified disorders of bone density and structure, multiple sites: Secondary | ICD-10-CM | POA: Diagnosis not present

## 2023-01-28 DIAGNOSIS — M810 Age-related osteoporosis without current pathological fracture: Secondary | ICD-10-CM | POA: Diagnosis not present

## 2023-01-28 DIAGNOSIS — M898X9 Other specified disorders of bone, unspecified site: Secondary | ICD-10-CM | POA: Diagnosis not present

## 2023-01-28 DIAGNOSIS — C50012 Malignant neoplasm of nipple and areola, left female breast: Secondary | ICD-10-CM | POA: Diagnosis not present

## 2023-01-28 DIAGNOSIS — T451X5A Adverse effect of antineoplastic and immunosuppressive drugs, initial encounter: Secondary | ICD-10-CM | POA: Diagnosis not present

## 2023-02-05 DIAGNOSIS — N1832 Chronic kidney disease, stage 3b: Secondary | ICD-10-CM | POA: Diagnosis not present

## 2023-02-09 DIAGNOSIS — N058 Unspecified nephritic syndrome with other morphologic changes: Secondary | ICD-10-CM | POA: Diagnosis not present

## 2023-02-09 DIAGNOSIS — I700 Atherosclerosis of aorta: Secondary | ICD-10-CM | POA: Diagnosis not present

## 2023-02-09 DIAGNOSIS — D631 Anemia in chronic kidney disease: Secondary | ICD-10-CM | POA: Diagnosis not present

## 2023-02-09 DIAGNOSIS — N057 Unspecified nephritic syndrome with diffuse crescentic glomerulonephritis: Secondary | ICD-10-CM | POA: Diagnosis not present

## 2023-02-09 DIAGNOSIS — N1832 Chronic kidney disease, stage 3b: Secondary | ICD-10-CM | POA: Diagnosis not present

## 2023-02-09 DIAGNOSIS — I129 Hypertensive chronic kidney disease with stage 1 through stage 4 chronic kidney disease, or unspecified chronic kidney disease: Secondary | ICD-10-CM | POA: Diagnosis not present

## 2023-02-09 DIAGNOSIS — N047 Nephrotic syndrome with diffuse crescentic glomerulonephritis: Secondary | ICD-10-CM | POA: Diagnosis not present

## 2023-02-09 DIAGNOSIS — I5032 Chronic diastolic (congestive) heart failure: Secondary | ICD-10-CM | POA: Diagnosis not present

## 2023-02-11 DIAGNOSIS — I82409 Acute embolism and thrombosis of unspecified deep veins of unspecified lower extremity: Secondary | ICD-10-CM | POA: Diagnosis not present

## 2023-02-11 DIAGNOSIS — I10 Essential (primary) hypertension: Secondary | ICD-10-CM | POA: Diagnosis not present

## 2023-02-11 DIAGNOSIS — I700 Atherosclerosis of aorta: Secondary | ICD-10-CM | POA: Diagnosis not present

## 2023-02-11 DIAGNOSIS — Z299 Encounter for prophylactic measures, unspecified: Secondary | ICD-10-CM | POA: Diagnosis not present

## 2023-02-11 DIAGNOSIS — Z7189 Other specified counseling: Secondary | ICD-10-CM | POA: Diagnosis not present

## 2023-02-11 DIAGNOSIS — Z0000 Encounter for general adult medical examination without abnormal findings: Secondary | ICD-10-CM | POA: Diagnosis not present

## 2023-02-17 DIAGNOSIS — C50012 Malignant neoplasm of nipple and areola, left female breast: Secondary | ICD-10-CM | POA: Diagnosis not present

## 2023-02-17 DIAGNOSIS — M8580 Other specified disorders of bone density and structure, unspecified site: Secondary | ICD-10-CM | POA: Diagnosis not present

## 2023-02-17 DIAGNOSIS — M898X9 Other specified disorders of bone, unspecified site: Secondary | ICD-10-CM | POA: Diagnosis not present

## 2023-02-17 DIAGNOSIS — T451X5A Adverse effect of antineoplastic and immunosuppressive drugs, initial encounter: Secondary | ICD-10-CM | POA: Diagnosis not present

## 2023-02-17 DIAGNOSIS — Z170 Estrogen receptor positive status [ER+]: Secondary | ICD-10-CM | POA: Diagnosis not present

## 2023-02-17 DIAGNOSIS — Z853 Personal history of malignant neoplasm of breast: Secondary | ICD-10-CM | POA: Diagnosis not present

## 2023-02-23 DIAGNOSIS — I10 Essential (primary) hypertension: Secondary | ICD-10-CM | POA: Diagnosis not present

## 2023-02-23 DIAGNOSIS — K219 Gastro-esophageal reflux disease without esophagitis: Secondary | ICD-10-CM | POA: Diagnosis not present

## 2023-03-05 DIAGNOSIS — C50012 Malignant neoplasm of nipple and areola, left female breast: Secondary | ICD-10-CM | POA: Diagnosis not present

## 2023-03-05 DIAGNOSIS — C50912 Malignant neoplasm of unspecified site of left female breast: Secondary | ICD-10-CM | POA: Diagnosis not present

## 2023-03-05 DIAGNOSIS — Z170 Estrogen receptor positive status [ER+]: Secondary | ICD-10-CM | POA: Diagnosis not present

## 2023-03-15 DIAGNOSIS — Z853 Personal history of malignant neoplasm of breast: Secondary | ICD-10-CM | POA: Diagnosis not present

## 2023-03-26 DIAGNOSIS — I10 Essential (primary) hypertension: Secondary | ICD-10-CM | POA: Diagnosis not present

## 2023-03-26 DIAGNOSIS — K219 Gastro-esophageal reflux disease without esophagitis: Secondary | ICD-10-CM | POA: Diagnosis not present

## 2023-03-29 DIAGNOSIS — H18413 Arcus senilis, bilateral: Secondary | ICD-10-CM | POA: Diagnosis not present

## 2023-03-29 DIAGNOSIS — H01001 Unspecified blepharitis right upper eyelid: Secondary | ICD-10-CM | POA: Diagnosis not present

## 2023-03-29 DIAGNOSIS — H40053 Ocular hypertension, bilateral: Secondary | ICD-10-CM | POA: Diagnosis not present

## 2023-03-29 DIAGNOSIS — H401431 Capsular glaucoma with pseudoexfoliation of lens, bilateral, mild stage: Secondary | ICD-10-CM | POA: Diagnosis not present

## 2023-04-06 ENCOUNTER — Encounter: Payer: Self-pay | Admitting: Diagnostic Neuroimaging

## 2023-04-06 ENCOUNTER — Ambulatory Visit (INDEPENDENT_AMBULATORY_CARE_PROVIDER_SITE_OTHER): Payer: 59 | Admitting: Diagnostic Neuroimaging

## 2023-04-06 VITALS — BP 132/84 | HR 68 | Ht 65.0 in | Wt 238.0 lb

## 2023-04-06 DIAGNOSIS — R0989 Other specified symptoms and signs involving the circulatory and respiratory systems: Secondary | ICD-10-CM | POA: Diagnosis not present

## 2023-04-06 DIAGNOSIS — G309 Alzheimer's disease, unspecified: Secondary | ICD-10-CM | POA: Diagnosis not present

## 2023-04-06 DIAGNOSIS — R410 Disorientation, unspecified: Secondary | ICD-10-CM

## 2023-04-06 DIAGNOSIS — R413 Other amnesia: Secondary | ICD-10-CM

## 2023-04-06 NOTE — Progress Notes (Signed)
[facility]  PATIENT: [name] [name] DOB: [birth year]  REFERRING CLINICIAN: Golden Pop, FNP HISTORY FROM: patient  REASON FOR VISIT: new consult   HISTORICAL  CHIEF COMPLAINT:  Chief Complaint  Patient presents with   New Patient (Initial Visit)    Rm 6, here with  Bjorn Loser daughter  Referred for cognitive difficulties. About a yr ago daughter states that after taking prednisone for 5 months straight, pt's memory changed.      HISTORY OF PRESENT ILLNESS:   82 year old female here for evaluation of confusion and memory loss.  Patient was in normal state of health and functioning well until June 2023.  She was shopping, driving, taking care of her ADLs.  In June 2023 patient presented to hospital for difficulty breathing.  She was diagnosed with acute kidney injury and acute glomerulonephritis, left leg DVT, hypertension and anemia.  She started on prednisone and followed up with nephrology.  She had additional confusion altered mental status admission in August 2023, was diagnosed with UTI.  Then she had lower GI bleeding from diverticulitis and required additional hospitalization.  Possibility of prednisone side effect was raised and she was weaned off by November 2023.  Unfortunately her cognitive decline has continued.  She is having problems with short-term memory loss, confusion, decreased insight, and decline in ADLs.  Patient lives with daughter for past 10 years.  Daughter is having to handle more of her ADLs.  There is an aide that also comes and helps him.    REVIEW OF SYSTEMS: Full 14 system review of systems performed and negative with exception of: as per HPI.  ALLERGIES: Allergies  Allergen Reactions   Ace Inhibitors Swelling   Hydralazine Other (See Comments)    Causes acute renal failure. Hydralazine induced glomerular nephritis   Sulfa Antibiotics Swelling and Rash    HOME MEDICATIONS: Outpatient Medications Prior to Visit   Medication Sig Dispense Refill   amLODipine (NORVASC) 10 MG tablet Take 5 mg by mouth daily.     anastrozole (ARIMIDEX) 1 MG tablet Take 1 mg by mouth daily.     apixaban (ELIQUIS) 5 MG TABS tablet Take 1 tablet (5 mg total) by mouth 2 (two) times daily. (Patient taking differently: Take 2.5 mg by mouth 2 (two) times daily.) 60 tablet 2   cetirizine (ZYRTEC) 10 MG tablet Take 10 mg by mouth daily.     COMBIGAN 0.2-0.5 % ophthalmic solution Place 1 drop into both eyes every 12 (twelve) hours.     famotidine (PEPCID) 40 MG tablet Take 40 mg by mouth daily.     furosemide (LASIX) 40 MG tablet Take 1 tablet (40 mg total) by mouth daily. (Patient taking differently: Take 80 mg by mouth daily.) 30 tablet 0   metoprolol tartrate (LOPRESSOR) 25 MG tablet Take 1 tablet (25 mg total) by mouth 2 (two) times daily. 60 tablet 0   cephALEXin (KEFLEX) 500 MG capsule Take 1 capsule (500 mg total) by mouth 3 (three) times daily. 21 capsule 0   Cholecalciferol 25 MCG (1000 UT) tablet Take 1,000 Units by mouth every other day.     pantoprazole (PROTONIX) 40 MG tablet Take 40 mg by mouth daily.     predniSONE (DELTASONE) 20 MG tablet Take 20 mg by mouth daily with breakfast.     ROCKLATAN 0.02-0.005 % SOLN Apply 1 drop to eye at bedtime.     No facility-administered medications prior to visit.    PAST MEDICAL HISTORY: Past Medical History:  Diagnosis Date   Anemia    Anxiety    Arthritis    back, shoulders ,  right hip   Breast cancer, left [facility]) oncologist-- dr Caron Presume [facility] in [place])--- per lov in epic no recurrence   dx 05/ 2011---- Stage I (T1,N0M0), DCIS, ER+;  s/p  left breast lumpectomy ,  completed radiation therapy 06-02-2010,  started antiestrogen   Full dentures    GERD (gastroesophageal reflux disease)    History of colon polyps 2009   Dr. Loreta Ave   History of external beam radiation therapy 04-21-2010  to 06-02-2010   left breast cancer   Hyperparathyroidism (HCC)    per pt dx 03/  2019,  was told by surgeon not high enough to worry about   Hypertension    Vitamin D deficiency    Wears glasses     PAST SURGICAL HISTORY: Past Surgical History:  Procedure Laterality Date   APPLICATION OF WOUND VAC Right 10/06/2018   Procedure: APPLICATION OF WOUND VAC;  Surgeon: Samson Frederic, MD;  Location: [facility];  Service: Orthopedics;  Laterality: Right;   BREAST LUMPECTOMY Left 2011   BREAST LUMPECTOMY Left 05/02/2020   COLONOSCOPY N/A 05/18/2013   Procedure: COLONOSCOPY;  Surgeon: Corbin Ade, MD;  Location: [facility];  Service: Endoscopy;  Laterality: N/A;  9:30   COLONOSCOPY WITH PROPOFOL N/A 06/13/2022   Procedure: COLONOSCOPY WITH PROPOFOL;  Surgeon: Sherrilyn Rist, MD;  Location: [facility];  Service: Gastroenterology;  Laterality: N/A;   DILATATION & CURETTAGE/HYSTEROSCOPY WITH MYOSURE N/A 05/28/2016   Procedure: DILATATION & CURETTAGE/HYSTEROSCOPY WITH MYOSURE;  Surgeon: Maxie Better, MD;  Location: [facility];  Service: Gynecology;  Laterality: N/A;   INCISION AND DRAINAGE HIP Right 10/06/2018   Procedure: DEBRIDEMENT AND CLOSURE RIGHT HIP;  Surgeon: Samson Frederic, MD;  Location: [facility];  Service: Orthopedics;  Laterality: Right;   TOTAL HIP ARTHROPLASTY Right 09/15/2018   Procedure: TOTAL HIP ARTHROPLASTY ANTERIOR APPROACH;  Surgeon: Samson Frederic, MD;  Location: [facility];  Service: Orthopedics;  Laterality: Right;   TOTAL KNEE ARTHROPLASTY Bilateral left 05-07-2009;  right 02-07-2010   both by dr Thomasena Edis @[facility]     FAMILY HISTORY: Family History  Problem Relation Age of Onset   Breast cancer Mother    Colon cancer Neg Hx    Lung cancer Neg Hx    Ovarian cancer Neg Hx    Esophageal cancer Neg Hx    Pancreatic cancer Neg Hx    Stomach cancer Neg Hx     SOCIAL HISTORY: Social History   Socioeconomic History   Marital status: Legally Separated    Spouse name: Not on file   Number of children: 4   Years of education: Not on file   Highest  education level: Not on file  Occupational History   Not on file  Tobacco Use   Smoking status: Former    Years: 15    Types: Cigarettes    Quit date: 09/09/1974    Years since quitting: 48.6   Smokeless tobacco: Never  Vaping Use   Vaping Use: Never used  Substance and Sexual Activity   Alcohol use: No   Drug use: Never   Sexual activity: Not on file  Other Topics Concern   Not on file  Social History Narrative   Not on file   Social Determinants of Health   Financial Resource Strain: Not on file  Food Insecurity: Not on file  Transportation Needs: Not on file  Physical  Activity: Not on file  Stress: Not on file  Social Connections: Not on file  Intimate Partner Violence: Not on file     PHYSICAL EXAM  GENERAL EXAM/CONSTITUTIONAL: Vitals:  Vitals:   04/06/23 1102  BP: 132/84  Pulse: 68  Weight: 238 lb (108 kg)  Height: 5\' 5"  (1.651 m)   Body mass index is 39.61 kg/m. Wt Readings from Last 3 Encounters:  04/06/23 238 lb (108 kg)  09/10/22 198 lb 6.6 oz (90 kg)  07/15/22 200 lb (90.7 kg)   Patient is in no distress; well developed, nourished and groomed; neck is supple  CARDIOVASCULAR: Examination of carotid arteries is normal; RIGHT CAROTID BRUIT Regular rate and rhythm, no murmurs Examination of peripheral vascular system by observation and palpation is normal  EYES: Ophthalmoscopic exam of optic discs and posterior segments is normal; no papilledema or hemorrhages No results found.  MUSCULOSKELETAL: Gait, strength, tone, movements noted in Neurologic exam below  NEUROLOGIC: MENTAL STATUS:     04/06/2023   11:14 AM  MMSE - Mini Mental State Exam  Orientation to time 5  Orientation to Place 5  Registration 3  Attention/ Calculation 1  Recall 0  Language- name 2 objects 2  Language- repeat 1  Language- follow 3 step command 3  Language- read & follow direction 1  Write a sentence 1  Copy design 0  Total score 22   awake, alert,  oriented to person, place and time recent and remote memory intact normal attention and concentration language fluent, comprehension intact, naming intact fund of knowledge appropriate  CRANIAL NERVE:  2nd - no papilledema on fundoscopic exam 2nd, 3rd, 4th, 6th - pupils equal and reactive to light, visual fields full to confrontation, extraocular muscles intact, no nystagmus 5th - facial sensation symmetric 7th - facial strength symmetric 8th - hearing intact 9th - palate elevates symmetrically, uvula midline 11th - shoulder shrug symmetric 12th - tongue protrusion midline  MOTOR:  normal bulk and tone, diffuse 4/5 strength in the BUE, BLE  SENSORY:  normal and symmetric to light touch, temperature, vibration  COORDINATION:  finger-nose-finger, fine finger movements normal  REFLEXES:  deep tendon reflexes TRACE and symmetric  GAIT/STATION:  narrow based gait; USING WALKER     DIAGNOSTIC DATA (LABS, IMAGING, TESTING) - I reviewed patient records, labs, notes, testing and imaging myself where available.  Lab Results  Component Value Date   WBC 11.3 (H) 09/10/2022   HGB 9.9 (L) 09/10/2022   HCT 31.3 (L) 09/10/2022   MCV 100.6 (H) 09/10/2022   PLT 296 09/10/2022      Component Value Date/Time   NA 139 09/10/2022 1654   K 3.8 09/10/2022 1654   CL 99 09/10/2022 1654   CO2 33 (H) 09/10/2022 1654   GLUCOSE 121 (H) 09/10/2022 1654   BUN 36 (H) 09/10/2022 1654   BUN 24 (A) 03/17/2013 0000   CREATININE 1.66 (H) 09/10/2022 1654   CREATININE 1.03 03/17/2013 0000   CALCIUM 10.8 (H) 09/10/2022 1654   PROT 7.4 09/10/2022 1654   ALBUMIN 3.2 (L) 09/10/2022 1654   ALBUMIN 3.8 03/17/2013 0000   AST 15 09/10/2022 1654   AST 14 03/17/2013 0000   ALT 11 09/10/2022 1654   ALKPHOS 74 09/10/2022 1654   ALKPHOS 93 03/17/2013 0000   BILITOT 0.7 09/10/2022 1654   BILITOT 0.3 03/17/2013 0000   GFRNONAA 31 (L) 09/10/2022 1654   GFRAA >60 10/06/2018 1610   No results found for:  "CHOL", "HDL", "LDLCALC", "LDLDIRECT", "  TRIG", "CHOLHDL" No results found for: "HGBA1C" No results found for: "VITAMINB12" No results found for: "TSH"  06/02/22 CT head 1. No acute intracranial abnormality. 2. Cerebral/cerebellar atrophy and small vessel ischemic change.   ASSESSMENT AND PLAN  82 y.o. year old female here with:   Dx:  1. Memory loss   2. Confusion   3. Alzheimer's disease, unspecified (CODE) (HCC)   4. Right carotid bruit     PLAN:  MEMORY LOSS, CONFUSION (short term loss, decreased insight, disoriented to situation / place, decline in ADLs; MMSE 22/30; possible mild-moderate dementia) - check MRI brain, labs - consider memantine 5mg  daily - safety / supervision issues reviewed - daily physical activity / exercise (at least 15-30 minutes) - eat more plants / vegetables - increase social activities, brain stimulation, games, puzzles, hobbies, crafts, arts, music - aim for at least 7-8 hours sleep per night (or more) - avoid smoking and alcohol - caregiver resources provided --> [email or web address] - needs help with medications, finances; no driving  Orders Placed This Encounter  Procedures   MR BRAIN WO CONTRAST   ATN PROFILE   Vitamin B12   TSH Rfx on Abnormal to Free T4   VAS US CAROTID   Return for pending test results, pending if symptoms worsen or fail to improve.    Suanne Marker, MD 04/06/2023, 11:54 AM Certified in Neurology, Neurophysiology and Neuroimaging  [facility] [address], [address] [place], [place] [address] [phone]

## 2023-04-06 NOTE — Patient Instructions (Signed)
MEMORY LOSS, CONFUSION (short term loss, decreased insight, disoriented to situation / place, decline in ADLs) - check MRI brain, labs - start memantine 10mg  at bedtime; increase to twice a day after 1-2 weeks - safety / supervision issues reviewed - daily physical activity / exercise (at least 15-30 minutes) - eat more plants / vegetables - increase social activities, brain stimulation, games, puzzles, hobbies, crafts, arts, music - aim for at least 7-8 hours sleep per night (or more) - avoid smoking and alcohol - caregiver resources provided --> [email or web address] - needs help with medications, finances; no driving

## 2023-04-07 ENCOUNTER — Telehealth: Payer: Self-pay | Admitting: Diagnostic Neuroimaging

## 2023-04-07 NOTE — Telephone Encounter (Signed)
[employer] medicare/[place] med[employer] NPR sent to [facility] [phone] 

## 2023-04-13 LAB — ATN PROFILE
A -- Beta-amyloid 42/40 Ratio: 0.114 (ref >0.102)
Beta-amyloid 40: 253.15 pg/mL
Beta-amyloid 42: 28.84 pg/mL
N -- NfL, Plasma: 7.7 pg/mL — ABNORMAL HIGH (ref 0.00–7.64)
T -- p-tau181: 1.44 pg/mL — ABNORMAL HIGH (ref 0.00–0.97)

## 2023-04-13 LAB — VITAMIN B12

## 2023-04-13 LAB — TSH RFX ON ABNORMAL TO FREE T4

## 2023-04-16 ENCOUNTER — Ambulatory Visit (HOSPITAL_COMMUNITY)
Admission: RE | Admit: 2023-04-16 | Discharge: 2023-04-16 | Disposition: A | Discharge status: Home / Self Care | Payer: 59 | Source: Ambulatory Visit | Attending: Diagnostic Neuroimaging | Admitting: Diagnostic Neuroimaging

## 2023-04-16 DIAGNOSIS — R413 Other amnesia: Secondary | ICD-10-CM | POA: Insufficient documentation

## 2023-04-16 DIAGNOSIS — R410 Disorientation, unspecified: Secondary | ICD-10-CM | POA: Diagnosis not present

## 2023-04-16 DIAGNOSIS — R0989 Other specified symptoms and signs involving the circulatory and respiratory systems: Secondary | ICD-10-CM | POA: Insufficient documentation

## 2023-05-13 DIAGNOSIS — N047 Nephrotic syndrome with diffuse crescentic glomerulonephritis: Secondary | ICD-10-CM | POA: Diagnosis not present

## 2023-05-13 DIAGNOSIS — E210 Primary hyperparathyroidism: Secondary | ICD-10-CM | POA: Diagnosis not present

## 2023-05-13 DIAGNOSIS — N390 Urinary tract infection, site not specified: Secondary | ICD-10-CM | POA: Diagnosis not present

## 2023-05-13 DIAGNOSIS — Z299 Encounter for prophylactic measures, unspecified: Secondary | ICD-10-CM | POA: Diagnosis not present

## 2023-05-21 ENCOUNTER — Other Ambulatory Visit: Payer: Medicare Other

## 2023-05-21 DIAGNOSIS — I129 Hypertensive chronic kidney disease with stage 1 through stage 4 chronic kidney disease, or unspecified chronic kidney disease: Secondary | ICD-10-CM | POA: Diagnosis not present

## 2023-05-21 DIAGNOSIS — N057 Unspecified nephritic syndrome with diffuse crescentic glomerulonephritis: Secondary | ICD-10-CM | POA: Diagnosis not present

## 2023-05-21 DIAGNOSIS — N058 Unspecified nephritic syndrome with other morphologic changes: Secondary | ICD-10-CM | POA: Diagnosis not present

## 2023-05-21 DIAGNOSIS — D631 Anemia in chronic kidney disease: Secondary | ICD-10-CM | POA: Diagnosis not present

## 2023-05-21 DIAGNOSIS — N1832 Chronic kidney disease, stage 3b: Secondary | ICD-10-CM | POA: Diagnosis not present

## 2023-05-21 DIAGNOSIS — I5032 Chronic diastolic (congestive) heart failure: Secondary | ICD-10-CM | POA: Diagnosis not present

## 2023-05-27 DIAGNOSIS — I10 Essential (primary) hypertension: Secondary | ICD-10-CM | POA: Diagnosis not present

## 2023-05-27 DIAGNOSIS — R001 Bradycardia, unspecified: Secondary | ICD-10-CM | POA: Diagnosis not present

## 2023-06-04 DIAGNOSIS — I739 Peripheral vascular disease, unspecified: Secondary | ICD-10-CM | POA: Diagnosis not present

## 2023-06-04 DIAGNOSIS — I10 Essential (primary) hypertension: Secondary | ICD-10-CM | POA: Diagnosis not present

## 2023-06-04 DIAGNOSIS — L853 Xerosis cutis: Secondary | ICD-10-CM | POA: Diagnosis not present

## 2023-06-14 DIAGNOSIS — Z170 Estrogen receptor positive status [ER+]: Secondary | ICD-10-CM | POA: Diagnosis not present

## 2023-06-14 DIAGNOSIS — M8580 Other specified disorders of bone density and structure, unspecified site: Secondary | ICD-10-CM | POA: Diagnosis not present

## 2023-06-14 DIAGNOSIS — C50012 Malignant neoplasm of nipple and areola, left female breast: Secondary | ICD-10-CM | POA: Diagnosis not present

## 2023-06-14 DIAGNOSIS — T451X5A Adverse effect of antineoplastic and immunosuppressive drugs, initial encounter: Secondary | ICD-10-CM | POA: Diagnosis not present

## 2023-06-14 DIAGNOSIS — M898X9 Other specified disorders of bone, unspecified site: Secondary | ICD-10-CM | POA: Diagnosis not present

## 2023-06-14 DIAGNOSIS — Z853 Personal history of malignant neoplasm of breast: Secondary | ICD-10-CM | POA: Diagnosis not present

## 2023-07-06 DIAGNOSIS — Z299 Encounter for prophylactic measures, unspecified: Secondary | ICD-10-CM | POA: Diagnosis not present

## 2023-07-06 DIAGNOSIS — L259 Unspecified contact dermatitis, unspecified cause: Secondary | ICD-10-CM | POA: Diagnosis not present

## 2023-07-06 DIAGNOSIS — I10 Essential (primary) hypertension: Secondary | ICD-10-CM | POA: Diagnosis not present

## 2023-07-07 LAB — AMB RESULTS CONSOLE CBG: Glucose: 87

## 2023-07-12 DIAGNOSIS — I82409 Acute embolism and thrombosis of unspecified deep veins of unspecified lower extremity: Secondary | ICD-10-CM | POA: Diagnosis not present

## 2023-07-12 DIAGNOSIS — I700 Atherosclerosis of aorta: Secondary | ICD-10-CM | POA: Diagnosis not present

## 2023-07-12 DIAGNOSIS — R21 Rash and other nonspecific skin eruption: Secondary | ICD-10-CM | POA: Diagnosis not present

## 2023-07-20 DIAGNOSIS — I10 Essential (primary) hypertension: Secondary | ICD-10-CM | POA: Diagnosis not present

## 2023-07-20 DIAGNOSIS — B029 Zoster without complications: Secondary | ICD-10-CM | POA: Diagnosis not present

## 2023-07-20 DIAGNOSIS — R600 Localized edema: Secondary | ICD-10-CM | POA: Diagnosis not present

## 2023-07-20 DIAGNOSIS — Z299 Encounter for prophylactic measures, unspecified: Secondary | ICD-10-CM | POA: Diagnosis not present

## 2023-07-27 ENCOUNTER — Encounter: Payer: Self-pay | Admitting: *Deleted

## 2023-07-27 DIAGNOSIS — Z299 Encounter for prophylactic measures, unspecified: Secondary | ICD-10-CM | POA: Diagnosis not present

## 2023-07-27 DIAGNOSIS — B029 Zoster without complications: Secondary | ICD-10-CM | POA: Diagnosis not present

## 2023-07-27 DIAGNOSIS — I10 Essential (primary) hypertension: Secondary | ICD-10-CM | POA: Diagnosis not present

## 2023-08-02 DIAGNOSIS — H18413 Arcus senilis, bilateral: Secondary | ICD-10-CM | POA: Diagnosis not present

## 2023-08-02 DIAGNOSIS — H401431 Capsular glaucoma with pseudoexfoliation of lens, bilateral, mild stage: Secondary | ICD-10-CM | POA: Diagnosis not present

## 2023-08-02 DIAGNOSIS — H40053 Ocular hypertension, bilateral: Secondary | ICD-10-CM | POA: Diagnosis not present

## 2023-08-09 DIAGNOSIS — D638 Anemia in other chronic diseases classified elsewhere: Secondary | ICD-10-CM | POA: Diagnosis not present

## 2023-08-09 DIAGNOSIS — I129 Hypertensive chronic kidney disease with stage 1 through stage 4 chronic kidney disease, or unspecified chronic kidney disease: Secondary | ICD-10-CM | POA: Diagnosis not present

## 2023-08-09 DIAGNOSIS — N1832 Chronic kidney disease, stage 3b: Secondary | ICD-10-CM | POA: Diagnosis not present

## 2023-08-09 DIAGNOSIS — R809 Proteinuria, unspecified: Secondary | ICD-10-CM | POA: Diagnosis not present

## 2023-08-13 DIAGNOSIS — Z299 Encounter for prophylactic measures, unspecified: Secondary | ICD-10-CM | POA: Diagnosis not present

## 2023-08-13 DIAGNOSIS — I82409 Acute embolism and thrombosis of unspecified deep veins of unspecified lower extremity: Secondary | ICD-10-CM | POA: Diagnosis not present

## 2023-08-13 DIAGNOSIS — R0981 Nasal congestion: Secondary | ICD-10-CM | POA: Diagnosis not present

## 2023-08-13 DIAGNOSIS — J309 Allergic rhinitis, unspecified: Secondary | ICD-10-CM | POA: Diagnosis not present

## 2023-08-13 DIAGNOSIS — Z23 Encounter for immunization: Secondary | ICD-10-CM | POA: Diagnosis not present

## 2023-08-13 DIAGNOSIS — I10 Essential (primary) hypertension: Secondary | ICD-10-CM | POA: Diagnosis not present

## 2023-08-19 DIAGNOSIS — Z299 Encounter for prophylactic measures, unspecified: Secondary | ICD-10-CM | POA: Diagnosis not present

## 2023-08-19 DIAGNOSIS — R350 Frequency of micturition: Secondary | ICD-10-CM | POA: Diagnosis not present

## 2023-08-19 DIAGNOSIS — I10 Essential (primary) hypertension: Secondary | ICD-10-CM | POA: Diagnosis not present

## 2023-08-19 DIAGNOSIS — N390 Urinary tract infection, site not specified: Secondary | ICD-10-CM | POA: Diagnosis not present

## 2023-09-10 DIAGNOSIS — N1832 Chronic kidney disease, stage 3b: Secondary | ICD-10-CM | POA: Diagnosis not present

## 2023-09-10 DIAGNOSIS — I700 Atherosclerosis of aorta: Secondary | ICD-10-CM | POA: Diagnosis not present

## 2023-09-10 DIAGNOSIS — Z853 Personal history of malignant neoplasm of breast: Secondary | ICD-10-CM | POA: Diagnosis not present

## 2023-10-13 DIAGNOSIS — C50912 Malignant neoplasm of unspecified site of left female breast: Secondary | ICD-10-CM | POA: Diagnosis not present

## 2023-10-13 DIAGNOSIS — R92321 Mammographic fibroglandular density, right breast: Secondary | ICD-10-CM | POA: Diagnosis not present

## 2023-10-13 DIAGNOSIS — Z853 Personal history of malignant neoplasm of breast: Secondary | ICD-10-CM | POA: Diagnosis not present

## 2023-10-13 DIAGNOSIS — Z08 Encounter for follow-up examination after completed treatment for malignant neoplasm: Secondary | ICD-10-CM | POA: Diagnosis not present

## 2023-10-13 DIAGNOSIS — N644 Mastodynia: Secondary | ICD-10-CM | POA: Diagnosis not present

## 2023-10-28 DIAGNOSIS — N189 Chronic kidney disease, unspecified: Secondary | ICD-10-CM | POA: Diagnosis not present

## 2023-10-28 DIAGNOSIS — R809 Proteinuria, unspecified: Secondary | ICD-10-CM | POA: Diagnosis not present

## 2023-10-28 DIAGNOSIS — D631 Anemia in chronic kidney disease: Secondary | ICD-10-CM | POA: Diagnosis not present

## 2023-11-15 DIAGNOSIS — R809 Proteinuria, unspecified: Secondary | ICD-10-CM | POA: Diagnosis not present

## 2023-11-15 DIAGNOSIS — I129 Hypertensive chronic kidney disease with stage 1 through stage 4 chronic kidney disease, or unspecified chronic kidney disease: Secondary | ICD-10-CM | POA: Diagnosis not present

## 2023-11-15 DIAGNOSIS — N1832 Chronic kidney disease, stage 3b: Secondary | ICD-10-CM | POA: Diagnosis not present

## 2023-11-15 DIAGNOSIS — D638 Anemia in other chronic diseases classified elsewhere: Secondary | ICD-10-CM | POA: Diagnosis not present

## 2023-12-27 DIAGNOSIS — H401431 Capsular glaucoma with pseudoexfoliation of lens, bilateral, mild stage: Secondary | ICD-10-CM | POA: Diagnosis not present

## 2023-12-27 DIAGNOSIS — H5203 Hypermetropia, bilateral: Secondary | ICD-10-CM | POA: Diagnosis not present

## 2023-12-27 DIAGNOSIS — H18413 Arcus senilis, bilateral: Secondary | ICD-10-CM | POA: Diagnosis not present

## 2023-12-27 DIAGNOSIS — H40053 Ocular hypertension, bilateral: Secondary | ICD-10-CM | POA: Diagnosis not present

## 2023-12-27 DIAGNOSIS — H2513 Age-related nuclear cataract, bilateral: Secondary | ICD-10-CM | POA: Diagnosis not present

## 2024-01-04 DIAGNOSIS — I700 Atherosclerosis of aorta: Secondary | ICD-10-CM | POA: Diagnosis not present

## 2024-01-04 DIAGNOSIS — Z299 Encounter for prophylactic measures, unspecified: Secondary | ICD-10-CM | POA: Diagnosis not present

## 2024-01-04 DIAGNOSIS — I10 Essential (primary) hypertension: Secondary | ICD-10-CM | POA: Diagnosis not present

## 2024-01-04 DIAGNOSIS — E210 Primary hyperparathyroidism: Secondary | ICD-10-CM | POA: Diagnosis not present

## 2024-01-04 DIAGNOSIS — J309 Allergic rhinitis, unspecified: Secondary | ICD-10-CM | POA: Diagnosis not present

## 2024-01-04 DIAGNOSIS — I739 Peripheral vascular disease, unspecified: Secondary | ICD-10-CM | POA: Diagnosis not present

## 2024-02-15 DIAGNOSIS — I700 Atherosclerosis of aorta: Secondary | ICD-10-CM | POA: Diagnosis not present

## 2024-02-15 DIAGNOSIS — Z7189 Other specified counseling: Secondary | ICD-10-CM | POA: Diagnosis not present

## 2024-02-15 DIAGNOSIS — I10 Essential (primary) hypertension: Secondary | ICD-10-CM | POA: Diagnosis not present

## 2024-02-15 DIAGNOSIS — I739 Peripheral vascular disease, unspecified: Secondary | ICD-10-CM | POA: Diagnosis not present

## 2024-02-15 DIAGNOSIS — Z0000 Encounter for general adult medical examination without abnormal findings: Secondary | ICD-10-CM | POA: Diagnosis not present

## 2024-02-15 DIAGNOSIS — Z299 Encounter for prophylactic measures, unspecified: Secondary | ICD-10-CM | POA: Diagnosis not present

## 2024-02-17 DIAGNOSIS — R319 Hematuria, unspecified: Secondary | ICD-10-CM | POA: Diagnosis not present

## 2024-02-17 DIAGNOSIS — R809 Proteinuria, unspecified: Secondary | ICD-10-CM | POA: Diagnosis not present

## 2024-02-17 DIAGNOSIS — D631 Anemia in chronic kidney disease: Secondary | ICD-10-CM | POA: Diagnosis not present

## 2024-02-17 DIAGNOSIS — N189 Chronic kidney disease, unspecified: Secondary | ICD-10-CM | POA: Diagnosis not present

## 2024-02-17 DIAGNOSIS — N057 Unspecified nephritic syndrome with diffuse crescentic glomerulonephritis: Secondary | ICD-10-CM | POA: Diagnosis not present

## 2024-02-21 DIAGNOSIS — I129 Hypertensive chronic kidney disease with stage 1 through stage 4 chronic kidney disease, or unspecified chronic kidney disease: Secondary | ICD-10-CM | POA: Diagnosis not present

## 2024-02-21 DIAGNOSIS — R809 Proteinuria, unspecified: Secondary | ICD-10-CM | POA: Diagnosis not present

## 2024-02-21 DIAGNOSIS — D638 Anemia in other chronic diseases classified elsewhere: Secondary | ICD-10-CM | POA: Diagnosis not present

## 2024-02-21 DIAGNOSIS — N1832 Chronic kidney disease, stage 3b: Secondary | ICD-10-CM | POA: Diagnosis not present

## 2024-03-03 DIAGNOSIS — N189 Chronic kidney disease, unspecified: Secondary | ICD-10-CM | POA: Diagnosis not present

## 2024-03-09 DIAGNOSIS — R809 Proteinuria, unspecified: Secondary | ICD-10-CM | POA: Diagnosis not present

## 2024-03-09 DIAGNOSIS — D638 Anemia in other chronic diseases classified elsewhere: Secondary | ICD-10-CM | POA: Diagnosis not present

## 2024-03-09 DIAGNOSIS — N1832 Chronic kidney disease, stage 3b: Secondary | ICD-10-CM | POA: Diagnosis not present

## 2024-03-09 DIAGNOSIS — I129 Hypertensive chronic kidney disease with stage 1 through stage 4 chronic kidney disease, or unspecified chronic kidney disease: Secondary | ICD-10-CM | POA: Diagnosis not present

## 2024-03-09 DIAGNOSIS — Z853 Personal history of malignant neoplasm of breast: Secondary | ICD-10-CM | POA: Diagnosis not present

## 2024-03-10 DIAGNOSIS — I10 Essential (primary) hypertension: Secondary | ICD-10-CM | POA: Diagnosis not present

## 2024-03-10 DIAGNOSIS — E210 Primary hyperparathyroidism: Secondary | ICD-10-CM | POA: Diagnosis not present

## 2024-03-10 DIAGNOSIS — N1832 Chronic kidney disease, stage 3b: Secondary | ICD-10-CM | POA: Diagnosis not present

## 2024-03-10 DIAGNOSIS — L02429 Furuncle of limb, unspecified: Secondary | ICD-10-CM | POA: Diagnosis not present

## 2024-03-10 DIAGNOSIS — Z299 Encounter for prophylactic measures, unspecified: Secondary | ICD-10-CM | POA: Diagnosis not present

## 2024-04-12 DIAGNOSIS — Z299 Encounter for prophylactic measures, unspecified: Secondary | ICD-10-CM | POA: Diagnosis not present

## 2024-04-12 DIAGNOSIS — R059 Cough, unspecified: Secondary | ICD-10-CM | POA: Diagnosis not present

## 2024-04-12 DIAGNOSIS — J069 Acute upper respiratory infection, unspecified: Secondary | ICD-10-CM | POA: Diagnosis not present

## 2024-04-12 DIAGNOSIS — N1832 Chronic kidney disease, stage 3b: Secondary | ICD-10-CM | POA: Diagnosis not present

## 2024-04-14 DIAGNOSIS — R809 Proteinuria, unspecified: Secondary | ICD-10-CM | POA: Diagnosis not present

## 2024-04-14 DIAGNOSIS — J069 Acute upper respiratory infection, unspecified: Secondary | ICD-10-CM | POA: Diagnosis not present

## 2024-04-14 DIAGNOSIS — J302 Other seasonal allergic rhinitis: Secondary | ICD-10-CM | POA: Diagnosis not present

## 2024-04-14 DIAGNOSIS — N189 Chronic kidney disease, unspecified: Secondary | ICD-10-CM | POA: Diagnosis not present

## 2024-04-14 DIAGNOSIS — E211 Secondary hyperparathyroidism, not elsewhere classified: Secondary | ICD-10-CM | POA: Diagnosis not present

## 2024-04-14 DIAGNOSIS — D631 Anemia in chronic kidney disease: Secondary | ICD-10-CM | POA: Diagnosis not present

## 2024-04-21 DIAGNOSIS — R809 Proteinuria, unspecified: Secondary | ICD-10-CM | POA: Diagnosis not present

## 2024-04-21 DIAGNOSIS — N057 Unspecified nephritic syndrome with diffuse crescentic glomerulonephritis: Secondary | ICD-10-CM | POA: Diagnosis not present

## 2024-04-21 DIAGNOSIS — N1832 Chronic kidney disease, stage 3b: Secondary | ICD-10-CM | POA: Diagnosis not present

## 2024-04-21 DIAGNOSIS — I129 Hypertensive chronic kidney disease with stage 1 through stage 4 chronic kidney disease, or unspecified chronic kidney disease: Secondary | ICD-10-CM | POA: Diagnosis not present

## 2024-05-16 DIAGNOSIS — Z79899 Other long term (current) drug therapy: Secondary | ICD-10-CM | POA: Diagnosis not present

## 2024-05-16 DIAGNOSIS — E559 Vitamin D deficiency, unspecified: Secondary | ICD-10-CM | POA: Diagnosis not present

## 2024-05-16 DIAGNOSIS — E7800 Pure hypercholesterolemia, unspecified: Secondary | ICD-10-CM | POA: Diagnosis not present

## 2024-05-16 DIAGNOSIS — R5383 Other fatigue: Secondary | ICD-10-CM | POA: Diagnosis not present

## 2024-05-17 DIAGNOSIS — H401431 Capsular glaucoma with pseudoexfoliation of lens, bilateral, mild stage: Secondary | ICD-10-CM | POA: Diagnosis not present

## 2024-05-17 DIAGNOSIS — H18413 Arcus senilis, bilateral: Secondary | ICD-10-CM | POA: Diagnosis not present

## 2024-05-17 DIAGNOSIS — H40053 Ocular hypertension, bilateral: Secondary | ICD-10-CM | POA: Diagnosis not present

## 2024-05-17 DIAGNOSIS — H2513 Age-related nuclear cataract, bilateral: Secondary | ICD-10-CM | POA: Diagnosis not present

## 2024-05-24 DIAGNOSIS — Z299 Encounter for prophylactic measures, unspecified: Secondary | ICD-10-CM | POA: Diagnosis not present

## 2024-05-24 DIAGNOSIS — I10 Essential (primary) hypertension: Secondary | ICD-10-CM | POA: Diagnosis not present

## 2024-05-24 DIAGNOSIS — Z0000 Encounter for general adult medical examination without abnormal findings: Secondary | ICD-10-CM | POA: Diagnosis not present

## 2024-05-25 DIAGNOSIS — M79674 Pain in right toe(s): Secondary | ICD-10-CM | POA: Diagnosis not present

## 2024-05-25 DIAGNOSIS — I70203 Unspecified atherosclerosis of native arteries of extremities, bilateral legs: Secondary | ICD-10-CM | POA: Diagnosis not present

## 2024-05-25 DIAGNOSIS — M79675 Pain in left toe(s): Secondary | ICD-10-CM | POA: Diagnosis not present

## 2024-05-25 DIAGNOSIS — L84 Corns and callosities: Secondary | ICD-10-CM | POA: Diagnosis not present

## 2024-05-25 DIAGNOSIS — B351 Tinea unguium: Secondary | ICD-10-CM | POA: Diagnosis not present

## 2024-07-19 DIAGNOSIS — H401431 Capsular glaucoma with pseudoexfoliation of lens, bilateral, mild stage: Secondary | ICD-10-CM | POA: Diagnosis not present

## 2024-07-19 DIAGNOSIS — H18413 Arcus senilis, bilateral: Secondary | ICD-10-CM | POA: Diagnosis not present

## 2024-07-19 DIAGNOSIS — H02831 Dermatochalasis of right upper eyelid: Secondary | ICD-10-CM | POA: Diagnosis not present

## 2024-07-19 DIAGNOSIS — H40053 Ocular hypertension, bilateral: Secondary | ICD-10-CM | POA: Diagnosis not present

## 2024-07-20 ENCOUNTER — Other Ambulatory Visit: Payer: Self-pay | Admitting: Internal Medicine

## 2024-07-20 DIAGNOSIS — Z1231 Encounter for screening mammogram for malignant neoplasm of breast: Secondary | ICD-10-CM

## 2024-08-01 ENCOUNTER — Ambulatory Visit
Admission: RE | Admit: 2024-08-01 | Discharge: 2024-08-01 | Disposition: A | Source: Ambulatory Visit | Attending: Internal Medicine | Admitting: Internal Medicine

## 2024-08-01 DIAGNOSIS — Z1231 Encounter for screening mammogram for malignant neoplasm of breast: Secondary | ICD-10-CM

## 2024-08-02 DIAGNOSIS — N057 Unspecified nephritic syndrome with diffuse crescentic glomerulonephritis: Secondary | ICD-10-CM | POA: Diagnosis not present

## 2024-08-02 DIAGNOSIS — R809 Proteinuria, unspecified: Secondary | ICD-10-CM | POA: Diagnosis not present

## 2024-08-02 DIAGNOSIS — D631 Anemia in chronic kidney disease: Secondary | ICD-10-CM | POA: Diagnosis not present

## 2024-08-02 DIAGNOSIS — E211 Secondary hyperparathyroidism, not elsewhere classified: Secondary | ICD-10-CM | POA: Diagnosis not present

## 2024-08-02 DIAGNOSIS — N189 Chronic kidney disease, unspecified: Secondary | ICD-10-CM | POA: Diagnosis not present

## 2024-08-09 DIAGNOSIS — N1832 Chronic kidney disease, stage 3b: Secondary | ICD-10-CM | POA: Diagnosis not present

## 2024-08-09 DIAGNOSIS — R809 Proteinuria, unspecified: Secondary | ICD-10-CM | POA: Diagnosis not present

## 2024-08-09 DIAGNOSIS — N057 Unspecified nephritic syndrome with diffuse crescentic glomerulonephritis: Secondary | ICD-10-CM | POA: Diagnosis not present

## 2024-08-16 DIAGNOSIS — I10 Essential (primary) hypertension: Secondary | ICD-10-CM | POA: Diagnosis not present

## 2024-08-16 DIAGNOSIS — Z299 Encounter for prophylactic measures, unspecified: Secondary | ICD-10-CM | POA: Diagnosis not present

## 2024-08-16 DIAGNOSIS — R52 Pain, unspecified: Secondary | ICD-10-CM | POA: Diagnosis not present

## 2024-08-16 DIAGNOSIS — M436 Torticollis: Secondary | ICD-10-CM | POA: Diagnosis not present

## 2024-08-16 DIAGNOSIS — Z23 Encounter for immunization: Secondary | ICD-10-CM | POA: Diagnosis not present

## 2024-08-16 DIAGNOSIS — M7918 Myalgia, other site: Secondary | ICD-10-CM | POA: Diagnosis not present

## 2024-08-20 ENCOUNTER — Other Ambulatory Visit: Payer: Self-pay

## 2024-08-20 ENCOUNTER — Encounter (HOSPITAL_COMMUNITY): Payer: Self-pay | Admitting: Emergency Medicine

## 2024-08-20 ENCOUNTER — Emergency Department (HOSPITAL_COMMUNITY): Admit: 2024-08-20 | Discharge: 2024-08-20 | Disposition: A

## 2024-08-20 ENCOUNTER — Emergency Department (HOSPITAL_COMMUNITY)

## 2024-08-20 ENCOUNTER — Emergency Department (HOSPITAL_COMMUNITY)
Admission: EM | Admit: 2024-08-20 | Discharge: 2024-08-20 | Disposition: A | Discharge status: Home / Self Care | Attending: Emergency Medicine | Admitting: Emergency Medicine

## 2024-08-20 DIAGNOSIS — Z7901 Long term (current) use of anticoagulants: Secondary | ICD-10-CM | POA: Insufficient documentation

## 2024-08-20 DIAGNOSIS — R0602 Shortness of breath: Secondary | ICD-10-CM | POA: Diagnosis not present

## 2024-08-20 DIAGNOSIS — I129 Hypertensive chronic kidney disease with stage 1 through stage 4 chronic kidney disease, or unspecified chronic kidney disease: Secondary | ICD-10-CM | POA: Insufficient documentation

## 2024-08-20 DIAGNOSIS — R2241 Localized swelling, mass and lump, right lower limb: Secondary | ICD-10-CM | POA: Insufficient documentation

## 2024-08-20 DIAGNOSIS — R0989 Other specified symptoms and signs involving the circulatory and respiratory systems: Secondary | ICD-10-CM | POA: Diagnosis not present

## 2024-08-20 DIAGNOSIS — M7989 Other specified soft tissue disorders: Secondary | ICD-10-CM

## 2024-08-20 DIAGNOSIS — M79661 Pain in right lower leg: Secondary | ICD-10-CM

## 2024-08-20 DIAGNOSIS — R600 Localized edema: Secondary | ICD-10-CM | POA: Diagnosis not present

## 2024-08-20 DIAGNOSIS — Z853 Personal history of malignant neoplasm of breast: Secondary | ICD-10-CM | POA: Diagnosis not present

## 2024-08-20 DIAGNOSIS — M25531 Pain in right wrist: Secondary | ICD-10-CM

## 2024-08-20 DIAGNOSIS — R2231 Localized swelling, mass and lump, right upper limb: Secondary | ICD-10-CM | POA: Diagnosis not present

## 2024-08-20 DIAGNOSIS — M25431 Effusion, right wrist: Secondary | ICD-10-CM | POA: Diagnosis not present

## 2024-08-20 DIAGNOSIS — M79641 Pain in right hand: Secondary | ICD-10-CM | POA: Diagnosis not present

## 2024-08-20 DIAGNOSIS — N189 Chronic kidney disease, unspecified: Secondary | ICD-10-CM | POA: Insufficient documentation

## 2024-08-20 DIAGNOSIS — Z79899 Other long term (current) drug therapy: Secondary | ICD-10-CM | POA: Insufficient documentation

## 2024-08-20 DIAGNOSIS — I700 Atherosclerosis of aorta: Secondary | ICD-10-CM | POA: Diagnosis not present

## 2024-08-20 DIAGNOSIS — Z96651 Presence of right artificial knee joint: Secondary | ICD-10-CM | POA: Diagnosis not present

## 2024-08-20 LAB — URIC ACID: Uric Acid, Serum: 10.2 mg/dL — ABNORMAL HIGH (ref 2.5–7.1)

## 2024-08-20 LAB — CBC WITH DIFFERENTIAL/PLATELET
Abs Immature Granulocytes: 0.06 K/uL (ref 0.00–0.07)
Basophils Absolute: 0.1 K/uL (ref 0.0–0.1)
Basophils Relative: 0 %
Eosinophils Absolute: 0.3 K/uL (ref 0.0–0.5)
Eosinophils Relative: 2 %
HCT: 40.2 % (ref 36.0–46.0)
Hemoglobin: 12.3 g/dL (ref 12.0–15.0)
Immature Granulocytes: 1 %
Lymphocytes Relative: 11 %
Lymphs Abs: 1.4 K/uL (ref 0.7–4.0)
MCH: 28.2 pg (ref 26.0–34.0)
MCHC: 30.6 g/dL (ref 30.0–36.0)
MCV: 92.2 fL (ref 80.0–100.0)
Monocytes Absolute: 0.9 K/uL (ref 0.1–1.0)
Monocytes Relative: 7 %
Neutro Abs: 10.3 K/uL — ABNORMAL HIGH (ref 1.7–7.7)
Neutrophils Relative %: 79 %
Platelets: 368 K/uL (ref 150–400)
RBC: 4.36 MIL/uL (ref 3.87–5.11)
RDW: 15.2 % (ref 11.5–15.5)
WBC: 13 K/uL — ABNORMAL HIGH (ref 4.0–10.5)
nRBC: 0 % (ref 0.0–0.2)

## 2024-08-20 LAB — URINALYSIS, ROUTINE W REFLEX MICROSCOPIC
Bacteria, UA: NONE SEEN
Bilirubin Urine: NEGATIVE
Glucose, UA: NEGATIVE mg/dL
Hgb urine dipstick: NEGATIVE
Ketones, ur: NEGATIVE mg/dL
Nitrite: NEGATIVE
Protein, ur: NEGATIVE mg/dL
Specific Gravity, Urine: 1.009 (ref 1.005–1.030)
pH: 6 (ref 5.0–8.0)

## 2024-08-20 LAB — COMPREHENSIVE METABOLIC PANEL WITH GFR
ALT: 6 U/L (ref 0–44)
AST: 16 U/L (ref 15–41)
Albumin: 3.6 g/dL (ref 3.5–5.0)
Alkaline Phosphatase: 109 U/L (ref 38–126)
Anion gap: 11 (ref 5–15)
BUN: 37 mg/dL — ABNORMAL HIGH (ref 8–23)
CO2: 30 mmol/L (ref 22–32)
Calcium: 10.5 mg/dL — ABNORMAL HIGH (ref 8.9–10.3)
Chloride: 99 mmol/L (ref 98–111)
Creatinine, Ser: 1.72 mg/dL — ABNORMAL HIGH (ref 0.44–1.00)
GFR, Estimated: 29 mL/min — ABNORMAL LOW (ref >60)
Glucose, Bld: 101 mg/dL — ABNORMAL HIGH (ref 70–99)
Potassium: 3.5 mmol/L (ref 3.5–5.1)
Sodium: 140 mmol/L (ref 135–145)
Total Bilirubin: 0.5 mg/dL (ref 0.0–1.2)
Total Protein: 8.3 g/dL — ABNORMAL HIGH (ref 6.5–8.1)

## 2024-08-20 LAB — PRO BRAIN NATRIURETIC PEPTIDE: Pro Brain Natriuretic Peptide: 89.4 pg/mL (ref <300.0)

## 2024-08-20 NOTE — Discharge Instructions (Addendum)
 It was a pleasure helping take care of you today.  Today your workup was overall reassuring.  Your white blood cell count was slightly elevated however you did not have any fever and your vital signs remained stable, you also denied infectious symptoms.  The x-rays of your wrist and leg were reassuring.  Due to your ongoing wrist pain you have been given a wrist brace, please wear for comfort and follow-up with your primary care provider and make them aware of your workup and findings today.  I also recommend topical Voltaren gel which is over-the-counter which may help with your symptoms.  Your walking was assessed prior to discharge and you were walking much better.  Please continue to monitor your exertional shortness of breath, wrist pain, as well as leg swelling.  If you develop any of the following symptoms including but not limited to fever, chills, chest pain, worsening shortness of breath, worsening leg swelling, worsening wrist pain, inability to walk, or other concerning symptom please return to the emergency department or seek further medical care. Recommend follow-up with your primary care within 1-2 weeks for re-check of symptoms and post-visit check.

## 2024-08-20 NOTE — ED Triage Notes (Signed)
 Pt reports right arm and right leg swelling that started 3-4 days ago.

## 2024-08-20 NOTE — Progress Notes (Signed)
 Orthopedic Tech Progress Note Patient Details:  [name] [name] Oct 05, 1941 [id number]  Ortho Devices Type of Ortho Device: Velcro wrist splint Ortho Device/Splint Location: Right wrist Ortho Device/Splint Interventions: Application   Post Interventions Patient Tolerated: Well  Massie FORBES Tedi Hughson 08/20/2024, 6:12 PM

## 2024-08-20 NOTE — ED Notes (Signed)
 Patient able to walk with walker with little assistance, O2 stayed between 95-98% RA

## 2024-08-20 NOTE — ED Provider Notes (Signed)
 [place] EMERGENCY DEPARTMENT AT [facility] Provider Note   CSN: [id number] Arrival date & time: 08/20/24  1056     Patient presents with: Leg Swelling   [name] [name] is a 83 y.o. female who presents to the emergency department with a chief complaint of right lower extremity swelling as well as right wrist/hand swelling.  Patient states that she has been experiencing pain that started on her left leg that has now transitioned to her right leg, daughter who she lives with states that she has had issues walking and with leg weakness.  States that she has been dragging her right foot when walking with her walker at home.  Also appreciates right wrist tenderness and pain with movement.  Denies history of inflammatory arthritis or gout, denies trauma/injury.  Denies fever, chills.  Does appreciate some exertional shortness of breath however patient and family disagree on if this acute or not.  Denies chest pain. Denies history of heart failure. History of blood clot and has been compliant with her Eliquis . Appreciates pain to R calf but denies specific calf redness or swelling. Denies recent surgery, long travel, period of immobility, or hormone replacement therapy. Past medical history significant for iron deficiency anemia, breast cancer, CKD, HTN, DVT, GI bleed, etc.    HPI     Prior to Admission medications   Medication Sig Start Date End Date Taking? Authorizing Provider  amLODipine  (NORVASC ) 10 MG tablet Take 5 mg by mouth daily. 02/26/22   [provider]  anastrozole  (ARIMIDEX ) 1 MG tablet Take 1 mg by mouth daily. 02/24/22   [provider]  apixaban  (ELIQUIS ) 5 MG TABS tablet Take 1 tablet (5 mg total) by mouth 2 (two) times daily. Patient taking differently: Take 2.5 mg by mouth 2 (two) times daily. 06/16/22   Drusilla Sabas RAMAN, MD  cetirizine (ZYRTEC) 10 MG tablet Take 10 mg by mouth daily.    [provider]  COMBIGAN  0.2-0.5 % ophthalmic  solution Place 1 drop into both eyes every 12 (twelve) hours. 05/22/20   [provider]  famotidine  (PEPCID ) 40 MG tablet Take 40 mg by mouth daily.    [provider]  furosemide  (LASIX ) 40 MG tablet Take 1 tablet (40 mg total) by mouth daily. Patient taking differently: Take 80 mg by mouth daily. 04/14/22   Evonnie Lenis, MD  metoprolol  tartrate (LOPRESSOR ) 25 MG tablet Take 1 tablet (25 mg total) by mouth 2 (two) times daily. 06/05/22   Jillian Buttery, MD    Allergies: Ace inhibitors, Hydralazine , and Sulfa antibiotics    Review of Systems  Respiratory:  Positive for shortness of breath.   Musculoskeletal:  Positive for arthralgias (R wrist) and myalgias (R leg).    Updated Vital Signs BP (!) 144/61 (BP Location: Right Arm)   Pulse 68   Temp 97.6 F (36.4 C) (Oral)   Resp 16   SpO2 91%   Physical Exam Vitals and nursing note reviewed.  Constitutional:      General: She is awake. She is not in acute distress.    Appearance: Normal appearance. She is not ill-appearing, toxic-appearing or diaphoretic.     Comments: Patient talking in full sentences on room air, no tachypnea  HENT:     Head: Normocephalic and atraumatic.  Eyes:     General: No scleral icterus. Neck:     Comments: No jvd Cardiovascular:     Rate and Rhythm: Normal rate and regular rhythm.  Pulmonary:  Effort: Pulmonary effort is normal. No respiratory distress.     Breath sounds: Normal breath sounds. No stridor. No wheezing, rhonchi or rales.  Musculoskeletal:        General: Normal range of motion.     Right lower leg: No edema.     Left lower leg: No edema.     Comments: Grossly normal ROM of all 4 extremities, tenderness with palpation of R wrist, tenderness with palpation of R calf  Mild swelling present to R wrist and hand as well as R leg  Patient ambulated without difficulty with walker  Patient able to plantar flex and dorsiflex bilateral lower extremities against resistance  and gravity, patient able to hold bilateral lower extremities up against gravity off the bed  Skin:    General: Skin is warm.     Capillary Refill: Capillary refill takes less than 2 seconds.     Comments: Mild swelling present to R wrist and R shin, no erythema or bruising present, no obvious skin injury  Neurological:     General: No focal deficit present.     Mental Status: She is alert and oriented to person, place, and time.  Psychiatric:        Mood and Affect: Mood normal.        Behavior: Behavior normal. Behavior is cooperative.     (all labs ordered are listed, but only abnormal results are displayed) Labs Reviewed  CBC WITH DIFFERENTIAL/PLATELET - Abnormal; Notable for the following components:      Result Value   WBC 13.0 (*)    Neutro Abs 10.3 (*)    All other components within normal limits  URINALYSIS, ROUTINE W REFLEX MICROSCOPIC - Abnormal; Notable for the following components:   Leukocytes,Ua TRACE (*)    All other components within normal limits  COMPREHENSIVE METABOLIC PANEL WITH GFR - Abnormal; Notable for the following components:   Glucose, Bld 101 (*)    BUN 37 (*)    Creatinine, Ser 1.72 (*)    Calcium 10.5 (*)    Total Protein 8.3 (*)    GFR, Estimated 29 (*)    All other components within normal limits  URIC ACID - Abnormal; Notable for the following components:   Uric Acid, Serum 10.2 (*)    All other components within normal limits  PRO BRAIN NATRIURETIC PEPTIDE    EKG: EKG Interpretation Date/Time:  Sunday August 20 2024 13:50:36 EDT Ventricular Rate:  69 PR Interval:  166 QRS Duration:  95 QT Interval:  404 QTC Calculation: 433 R Axis:   0  Text Interpretation: Sinus rhythm Probable left ventricular hypertrophy Artifact Abnormal ECG Confirmed by Garrick Charleston [phone]) on 08/20/2024 4:17:45 PM  Radiology: VAS US  LOWER EXTREMITY VENOUS (DVT) (ONLY [facility] & [facility]) Result Date: 08/21/2024  Lower Venous DVT Study Patient Name:  [name] PAPPALARDO  [name]  Date of Exam:   08/20/2024 Medical Rec #: [id number]          Accession #:    [id number] Date of Birth: April 08, 1941          Patient Gender: F Patient Age:   32 years Exam Location:  [facility] Procedure:      VAS US  LOWER EXTREMITY VENOUS (DVT) Referring Phys: Lavin Petteway --------------------------------------------------------------------------------  Indications: Pain.  Risk Factors: None identified. Limitations: Body habitus and poor ultrasound/tissue interface. Comparison Study: No prior studies. Performing Technologist: Cordella Collet RVT  Examination Guidelines: A complete evaluation includes B-mode imaging, spectral Doppler, color Doppler, and  power Doppler as needed of all accessible portions of each vessel. Bilateral testing is considered an integral part of a complete examination. Limited examinations for reoccurring indications may be performed as noted. The reflux portion of the exam is performed with the patient in reverse Trendelenburg.  +---------+---------------+---------+-----------+----------+-------------------+ RIGHT    CompressibilityPhasicitySpontaneityPropertiesThrombus Aging      +---------+---------------+---------+-----------+----------+-------------------+ CFV      Full           Yes      Yes                                      +---------+---------------+---------+-----------+----------+-------------------+ SFJ      Full                                                             +---------+---------------+---------+-----------+----------+-------------------+ FV Prox  Full                                                             +---------+---------------+---------+-----------+----------+-------------------+ FV Mid                  Yes      Yes                                      +---------+---------------+---------+-----------+----------+-------------------+ FV Distal               Yes      Yes                                       +---------+---------------+---------+-----------+----------+-------------------+ PFV      Full                                                             +---------+---------------+---------+-----------+----------+-------------------+ POP      Full           Yes      Yes                                      +---------+---------------+---------+-----------+----------+-------------------+ PTV      Full                                                             +---------+---------------+---------+-----------+----------+-------------------+ PERO  Not well visualized +---------+---------------+---------+-----------+----------+-------------------+   +----+---------------+---------+-----------+----------+--------------+ LEFTCompressibilityPhasicitySpontaneityPropertiesThrombus Aging +----+---------------+---------+-----------+----------+--------------+ CFV Full           Yes      Yes                                 +----+---------------+---------+-----------+----------+--------------+    Summary: RIGHT: - There is no evidence of deep vein thrombosis in the lower extremity. However, portions of this examination were limited- see technologist comments above.  - No cystic structure found in the popliteal fossa.  LEFT: - No evidence of common femoral vein obstruction.   *See table(s) above for measurements and observations. Electronically signed by Gaile New MD on 08/21/2024 at 10:25:55 AM.    Final    DG Chest 2 View Result Date: 08/20/2024 EXAM: 2 VIEW(S) XRAY OF THE CHEST 08/20/2024 01:23:40 PM COMPARISON: 06/02/2022 CLINICAL HISTORY: right shin swelling, tenderness. Pt reports right wrist and right leg swelling onset yesterday per patient. Denies injury. Denies any cardiac history. FINDINGS: LUNGS AND PLEURA: No focal pulmonary opacity. No significant pulmonary edema or pleural effusion. No pneumothorax. HEART AND  MEDIASTINUM: Heart size at upper limits. Aortic atherosclerosis. BONES AND SOFT TISSUES: Bilateral shoulder DJD. No acute osseous abnormality. IMPRESSION: 1. No acute findings. 2. Low lung volumes. 3. Aortic atherosclerotic calcifications. Electronically signed by: Waddell Calk MD 08/20/2024 01:40 PM EDT RP Workstation: [id number]   DG Wrist Complete Right Result Date: 08/20/2024 EXAM: 3 OR MORE VIEW(S) XRAY OF THE RIGHT WRIST 08/20/2024 01:23:40 PM COMPARISON: None available. CLINICAL HISTORY: right shin swelling, tenderness. Pt reports right wrist and right leg swelling onset yesterday per patient. Denies injury. Denies any cardiac history. FINDINGS: BONES AND JOINTS: No acute fracture. Subchondral cystic changes within the distal radius and ulna. 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 SOFT TISSUES: Mild dorsal soft tissue swelling. Diffuse atherosclerosis. IMPRESSION: 1. 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 2. Mild dorsal soft tissue swelling. Electronically signed by: Waddell Calk MD 08/20/2024 01:38 PM EDT RP Workstation: [id number]   DG Tibia/Fibula Right Result Date: 08/20/2024 EXAM: 2 VIEW(S) XRAY OF THE RIGHT TIBIA AND FIBULA 08/20/2024 01:23:40 PM COMPARISON: None available. CLINICAL HISTORY: right shin swelling, tenderness. Pt reports right wrist and right leg swelling onset yesterday per patient. Denies injury. Denies any cardiac history. FINDINGS: BONES AND JOINTS: Right knee arthroplasty in place without complication. No acute fracture. No focal osseous lesion. No joint dislocation. SOFT TISSUES: Mild diffuse subcutaneous edema. Soft tissue calcifications. Vascular calcifications. IMPRESSION: 1. No acute findings. 2. Right knee arthroplasty in place without complication. Electronically signed by: Waddell Calk MD 08/20/2024 01:36 PM EDT  RP Workstation: [id number]     Procedures   Medications Ordered in the ED - No data to display  Clinical Course as of 08/21/24 1833  Sun Aug 20, 2024  1729 Plan for ambulation, wrist brace, and discharge [CH]    Clinical Course User Index [CH] Lester Crickenberger, Terr[place] FALCON, PA-C                                 Medical Decision Making Amount and/or Complexity of Data Reviewed Labs: ordered. Radiology: ordered.   Patient presents to the ED for concern of multiple complaints including R wrist tenderness/swelling, R leg swelling, issues with ambulation, as well as exertional shortness of  breath, this involves an extensive number of treatment options, and is a complaint that carries with it a high risk of complications and morbidity.  The differential diagnosis includes:   R wrist: trauma/injury, osteoarthritis flare, inflammatory arthritis, fluid overload, etc.  R leg: trauma/injury, osteoarthritis flare, inflammatory arthritis, new onset HF, DVT, etc.  Shortness of breath: new onset HF, pneumonia, pneumothorax, arrhythmia, etc.    Co morbidities that complicate the patient evaluation  iron deficiency anemia, breast cancer, CKD, HTN, DVT, GI bleed   Additional history obtained:  Additional history obtained from family who are at bedside including daughter who is caregiver at home who was on the phone   Lab Tests:  I Ordered, and personally interpreted labs.  The pertinent results include:  CBC significant for WBC 13, CMP significant for creatinine of 1.72 with an estimated GFR of 29, urinalysis not consistent with infection, uric acid pending at time of discharge, proBNP 89.4   Imaging Studies ordered:  I ordered imaging studies including CXR, xray of R wrist, Xray of R tib/fib I independently visualized and interpreted imaging which showed no acute process in the chest, xray of R wrist did show widening of the scapholunate distance consistent with scapholunate dissociation (however  patient denies trauma/injury and has grossly normal ROM), as well as mild dorsal soft tissue swelling, xray of R tib/fib showed no acute findings I agree with the radiologist interpretation   Cardiac Monitoring:  The patient was maintained on a cardiac monitor.  I personally viewed and interpreted the cardiac monitored which showed an underlying rhythm of: sinus rhythm   Medicines ordered and prescription drug management:  I have reviewed the patients home medicines and have made adjustments as needed   Test Considered:  none   Critical Interventions:  none   Problem List / ED Course:  83 year old female, vital signs stable, presents to the emergency department with multiple complaints.  Family is at bedside and states that over the last few days she has been experiencing leg weakness which started in her left leg and now has transitioned to her right leg.  Patient denies trauma or injury.  Family at bedside states that patient walks with a walker at home at baseline and is ambulatory currently but is dragging her right leg.  Patient does appreciate some right leg pain as well as some mild swelling.  Also appreciates right wrist pain and tenderness as well as some exertional shortness of breath.  Denies infectious symptoms.  No history of heart failure. On physical exam patient overall well-appearing, oxygen saturation 94 to 95% on room air, no obvious abnormality with auscultation of heart or lungs, mild tenderness and swelling present to right wrist and right hand however grossly normal range of motion and equal grip strength bilaterally, mild swelling present to right anterior shin however no overlying skin changes, no abnormality of right knee joint or right ankle joint, patient does appreciate right calf tenderness and does have a history of a prior DVT however has been compliant with her anticoagulation therapy, denies other DVT risk factors Patient presents with multiple  complaints, will complete large workup for evaluation, CBC significant for mild leukocytosis with white blood cell count of 13,000 however patient denies infectious symptoms, CMP consistent with known history of CKD, urinalysis not consistent with infection, uric acid pending at this time, question possibility of inflammatory arthritis due to presentation Imaging of chest, right wrist, as well as right leg reassuring, question of possible dissociation on x-ray of right  wrist however patient denies trauma/injury, negative for DVT in RLE Patient and family educated on reassuring workup here in the emergency department today, I educated them that they could follow-up with primary care provider regarding uric acid level however I do not believe that this will change management at this time Ambulated patient prior to discharge with walker, patient oxygen saturation remained in the mid 90s while ambulating, patient walked without issue and was not dragging either leg prior to discharge from the hospital and did not experience acute shortness of breath Family and patient agree with discharge after ambulation Return precautions given Patient discharged Overall reassuring workup here in the emergency department, no acute diagnosis made, patient was provided a right wrist brace for support, recommended follow-up with primary care provider regarding findings today as well as ongoing treatment, suspect that right wrist tenderness and pain may be due to inflammatory arthritis or osteoarthritis flare, patient was negative for DVT in right lower extremity, no acute cause found for shortness of breath however patient ambulated with reassuring oxygen saturation prior to discharge   Reevaluation:  After the interventions noted above, I reevaluated the patient and found that they have :improved   Social Determinants of Health:  none   Dispostion:  After consideration of the diagnostic results and the patients  response to treatment, I feel that the patent would benefit from discharge and outpatient therapy as described, follow-up with PCP, use of R wrist brace for comfort.      Final diagnoses:  Right wrist pain  Leg swelling  Shortness of breath    ED Discharge Orders     None          Janetta Terrall FALCON, PA-C 08/21/24 1834    Francesca Elsie CROME, MD 08/21/24 1836

## 2024-08-20 NOTE — Progress Notes (Signed)
 Right lower extremity venous duplex has been completed. Preliminary results can be found in CV Proc through chart review.  Results were given to [facility] PA.  08/20/24 3:39 PM Cathlyn Collet RVT
